# Patient Record
Sex: Male | Born: 1950 | Race: White | Hispanic: No | Marital: Married | Smoking: Former smoker
Health system: Southern US, Community
[De-identification: ages and names within clinical notes are randomized; demographics above are authoritative.]

## PROBLEM LIST (undated history)

## (undated) DIAGNOSIS — M1990 Unspecified osteoarthritis, unspecified site: Secondary | ICD-10-CM

## (undated) DIAGNOSIS — M545 Low back pain, unspecified: Secondary | ICD-10-CM

## (undated) DIAGNOSIS — K859 Acute pancreatitis without necrosis or infection, unspecified: Secondary | ICD-10-CM

## (undated) DIAGNOSIS — E785 Hyperlipidemia, unspecified: Secondary | ICD-10-CM

## (undated) DIAGNOSIS — T7840XA Allergy, unspecified, initial encounter: Secondary | ICD-10-CM

## (undated) DIAGNOSIS — E119 Type 2 diabetes mellitus without complications: Secondary | ICD-10-CM

## (undated) DIAGNOSIS — R0600 Dyspnea, unspecified: Secondary | ICD-10-CM

## (undated) DIAGNOSIS — I10 Essential (primary) hypertension: Secondary | ICD-10-CM

## (undated) DIAGNOSIS — K219 Gastro-esophageal reflux disease without esophagitis: Secondary | ICD-10-CM

## (undated) DIAGNOSIS — D649 Anemia, unspecified: Secondary | ICD-10-CM

## (undated) HISTORY — DX: Low back pain: M54.5

## (undated) HISTORY — DX: Low back pain, unspecified: M54.50

## (undated) HISTORY — PX: COLONOSCOPY: SHX174

## (undated) HISTORY — DX: Hyperlipidemia, unspecified: E78.5

## (undated) HISTORY — DX: Type 2 diabetes mellitus without complications: E11.9

## (undated) HISTORY — DX: Allergy, unspecified, initial encounter: T78.40XA

## (undated) HISTORY — DX: Anemia, unspecified: D64.9

## (undated) HISTORY — DX: Essential (primary) hypertension: I10

## (undated) HISTORY — DX: Acute pancreatitis without necrosis or infection, unspecified: K85.90

## (undated) HISTORY — DX: Unspecified osteoarthritis, unspecified site: M19.90

## (undated) HISTORY — PX: OTHER SURGICAL HISTORY: SHX169

## (undated) HISTORY — DX: Gastro-esophageal reflux disease without esophagitis: K21.9

---
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
## 1991-09-19 HISTORY — PX: LAMINECTOMY: SHX219

## 1998-08-17 ENCOUNTER — Inpatient Hospital Stay (HOSPITAL_COMMUNITY): Admission: EM | Admit: 1998-08-17 | Discharge: 1998-08-21 | Payer: Self-pay | Admitting: Emergency Medicine

## 1998-08-17 ENCOUNTER — Encounter: Payer: Self-pay | Admitting: Emergency Medicine

## 1999-12-18 ENCOUNTER — Encounter: Payer: Self-pay | Admitting: Family Medicine

## 1999-12-18 LAB — CONVERTED CEMR LAB: PSA: 1.2 ng/mL

## 2000-12-17 ENCOUNTER — Encounter: Payer: Self-pay | Admitting: Family Medicine

## 2000-12-17 LAB — CONVERTED CEMR LAB: PSA: 0.9 ng/mL

## 2004-03-18 ENCOUNTER — Encounter: Payer: Self-pay | Admitting: Family Medicine

## 2004-03-18 LAB — CONVERTED CEMR LAB: PSA: 1.4 ng/mL

## 2004-10-11 ENCOUNTER — Ambulatory Visit: Payer: Self-pay | Admitting: Family Medicine

## 2004-11-07 ENCOUNTER — Ambulatory Visit: Payer: Self-pay | Admitting: Family Medicine

## 2005-01-31 ENCOUNTER — Ambulatory Visit: Payer: Self-pay | Admitting: Family Medicine

## 2005-02-14 ENCOUNTER — Ambulatory Visit: Payer: Self-pay | Admitting: Family Medicine

## 2005-04-27 ENCOUNTER — Ambulatory Visit: Payer: Self-pay | Admitting: Family Medicine

## 2005-04-27 LAB — CONVERTED CEMR LAB
Hgb A1c MFr Bld: 5.9 %
PSA: 1.74 ng/mL

## 2005-05-01 ENCOUNTER — Ambulatory Visit: Payer: Self-pay | Admitting: Family Medicine

## 2005-05-15 ENCOUNTER — Ambulatory Visit: Payer: Self-pay | Admitting: Family Medicine

## 2007-03-01 ENCOUNTER — Ambulatory Visit: Payer: Self-pay | Admitting: Family Medicine

## 2007-03-01 DIAGNOSIS — Z87891 Personal history of nicotine dependence: Secondary | ICD-10-CM | POA: Insufficient documentation

## 2007-03-01 DIAGNOSIS — N400 Enlarged prostate without lower urinary tract symptoms: Secondary | ICD-10-CM | POA: Insufficient documentation

## 2007-03-01 LAB — CONVERTED CEMR LAB
ALT: 16 units/L (ref 0–40)
AST: 15 units/L (ref 0–37)
Albumin: 4.2 g/dL (ref 3.5–5.2)
Alkaline Phosphatase: 36 units/L — ABNORMAL LOW (ref 39–117)
BUN: 15 mg/dL (ref 6–23)
Basophils Absolute: 0.1 10*3/uL (ref 0.0–0.1)
Basophils Relative: 0.6 % (ref 0.0–1.0)
Bilirubin, Direct: 0.1 mg/dL (ref 0.0–0.3)
CO2: 30 meq/L (ref 19–32)
Calcium: 9.5 mg/dL (ref 8.4–10.5)
Chloride: 107 meq/L (ref 96–112)
Cholesterol: 144 mg/dL (ref 0–200)
Creatinine, Ser: 0.9 mg/dL (ref 0.4–1.5)
Creatinine,U: 104.2 mg/dL
Eosinophils Absolute: 0.1 10*3/uL (ref 0.0–0.6)
Eosinophils Relative: 1.3 % (ref 0.0–5.0)
GFR calc Af Amer: 114 mL/min
GFR calc non Af Amer: 94 mL/min
Glucose, Bld: 95 mg/dL (ref 70–99)
HCT: 40 % (ref 39.0–52.0)
HDL: 33.4 mg/dL — ABNORMAL LOW (ref >39.0)
Hemoglobin: 14 g/dL (ref 13.0–17.0)
LDL Cholesterol: 96 mg/dL (ref 0–99)
Lipase: 16 units/L (ref 11.0–59.0)
Lymphocytes Relative: 24.3 % (ref 12.0–46.0)
MCHC: 35.1 g/dL (ref 30.0–36.0)
MCV: 87.4 fL (ref 78.0–100.0)
Microalb Creat Ratio: 2.9 mg/g (ref 0.0–30.0)
Microalb, Ur: 0.3 mg/dL (ref 0.0–1.9)
Monocytes Absolute: 0.8 10*3/uL — ABNORMAL HIGH (ref 0.2–0.7)
Monocytes Relative: 8.3 % (ref 3.0–11.0)
Neutro Abs: 6.3 10*3/uL (ref 1.4–7.7)
Neutrophils Relative %: 65.5 % (ref 43.0–77.0)
PSA: 0.93 ng/mL (ref 0.10–4.00)
Platelets: 179 10*3/uL (ref 150–400)
Potassium: 4.4 meq/L (ref 3.5–5.1)
RBC: 4.58 M/uL (ref 4.22–5.81)
RDW: 12.5 % (ref 11.5–14.6)
Sodium: 142 meq/L (ref 135–145)
TSH: 0.88 microintl units/mL (ref 0.35–5.50)
Total Bilirubin: 1.1 mg/dL (ref 0.3–1.2)
Total CHOL/HDL Ratio: 4.3
Total Protein: 6.2 g/dL (ref 6.0–8.3)
Triglycerides: 74 mg/dL (ref 0–149)
VLDL: 15 mg/dL (ref 0–40)
WBC: 9.7 10*3/uL (ref 4.5–10.5)

## 2007-03-04 ENCOUNTER — Encounter: Payer: Self-pay | Admitting: Family Medicine

## 2007-03-04 DIAGNOSIS — K219 Gastro-esophageal reflux disease without esophagitis: Secondary | ICD-10-CM | POA: Insufficient documentation

## 2007-03-04 DIAGNOSIS — F528 Other sexual dysfunction not due to a substance or known physiological condition: Secondary | ICD-10-CM | POA: Insufficient documentation

## 2007-03-04 DIAGNOSIS — E785 Hyperlipidemia, unspecified: Secondary | ICD-10-CM | POA: Insufficient documentation

## 2007-03-04 DIAGNOSIS — E291 Testicular hypofunction: Secondary | ICD-10-CM | POA: Insufficient documentation

## 2007-03-04 DIAGNOSIS — I10 Essential (primary) hypertension: Secondary | ICD-10-CM | POA: Insufficient documentation

## 2007-03-04 DIAGNOSIS — E119 Type 2 diabetes mellitus without complications: Secondary | ICD-10-CM | POA: Insufficient documentation

## 2007-03-05 ENCOUNTER — Ambulatory Visit: Payer: Self-pay | Admitting: Family Medicine

## 2007-03-20 ENCOUNTER — Encounter (INDEPENDENT_AMBULATORY_CARE_PROVIDER_SITE_OTHER): Payer: Self-pay | Admitting: *Deleted

## 2007-03-20 ENCOUNTER — Ambulatory Visit: Payer: Self-pay | Admitting: Family Medicine

## 2007-08-16 ENCOUNTER — Ambulatory Visit: Payer: Self-pay | Admitting: Family Medicine

## 2007-08-16 DIAGNOSIS — M549 Dorsalgia, unspecified: Secondary | ICD-10-CM | POA: Insufficient documentation

## 2008-03-05 ENCOUNTER — Telehealth: Payer: Self-pay | Admitting: Family Medicine

## 2008-04-03 ENCOUNTER — Ambulatory Visit: Payer: Self-pay | Admitting: Family Medicine

## 2008-04-05 LAB — CONVERTED CEMR LAB
ALT: 20 units/L (ref 0–53)
AST: 16 units/L (ref 0–37)
Albumin: 4.5 g/dL (ref 3.5–5.2)
Alkaline Phosphatase: 47 units/L (ref 39–117)
BUN: 11 mg/dL (ref 6–23)
Basophils Absolute: 0 10*3/uL (ref 0.0–0.1)
Basophils Relative: 0.1 % (ref 0.0–3.0)
Bilirubin, Direct: 0.1 mg/dL (ref 0.0–0.3)
CO2: 30 meq/L (ref 19–32)
Calcium: 9.8 mg/dL (ref 8.4–10.5)
Chloride: 104 meq/L (ref 96–112)
Cholesterol: 174 mg/dL (ref 0–200)
Creatinine, Ser: 0.9 mg/dL (ref 0.4–1.5)
Eosinophils Absolute: 0.2 10*3/uL (ref 0.0–0.7)
Eosinophils Relative: 2.1 % (ref 0.0–5.0)
GFR calc Af Amer: 113 mL/min
GFR calc non Af Amer: 93 mL/min
Glucose, Bld: 99 mg/dL (ref 70–99)
HCT: 42.6 % (ref 39.0–52.0)
HDL: 32.2 mg/dL — ABNORMAL LOW (ref >39.0)
Hemoglobin: 14.5 g/dL (ref 13.0–17.0)
LDL Cholesterol: 112 mg/dL — ABNORMAL HIGH (ref 0–99)
Lymphocytes Relative: 32.9 % (ref 12.0–46.0)
MCHC: 34 g/dL (ref 30.0–36.0)
MCV: 90.1 fL (ref 78.0–100.0)
Monocytes Absolute: 0.9 10*3/uL (ref 0.1–1.0)
Monocytes Relative: 8.8 % (ref 3.0–12.0)
Neutro Abs: 5.7 10*3/uL (ref 1.4–7.7)
Neutrophils Relative %: 56.1 % (ref 43.0–77.0)
PSA: 0.97 ng/mL (ref 0.10–4.00)
Platelets: 186 10*3/uL (ref 150–400)
Potassium: 4.4 meq/L (ref 3.5–5.1)
RBC: 4.73 M/uL (ref 4.22–5.81)
RDW: 12.5 % (ref 11.5–14.6)
Sodium: 141 meq/L (ref 135–145)
TSH: 1.41 microintl units/mL (ref 0.35–5.50)
Testosterone: 477.07 ng/dL (ref 350.00–890)
Total Bilirubin: 1 mg/dL (ref 0.3–1.2)
Total CHOL/HDL Ratio: 5.4
Total Protein: 6.7 g/dL (ref 6.0–8.3)
Triglycerides: 150 mg/dL — ABNORMAL HIGH (ref 0–149)
VLDL: 30 mg/dL (ref 0–40)
Vitamin B-12: 783 pg/mL (ref 211–911)
WBC: 10.1 10*3/uL (ref 4.5–10.5)

## 2008-04-07 ENCOUNTER — Ambulatory Visit: Payer: Self-pay | Admitting: Family Medicine

## 2008-04-17 ENCOUNTER — Ambulatory Visit: Payer: Self-pay | Admitting: Family Medicine

## 2008-04-17 LAB — CONVERTED CEMR LAB
OCCULT 1: NEGATIVE
OCCULT 2: NEGATIVE
OCCULT 3: NEGATIVE

## 2008-04-20 ENCOUNTER — Encounter (INDEPENDENT_AMBULATORY_CARE_PROVIDER_SITE_OTHER): Payer: Self-pay | Admitting: *Deleted

## 2009-04-09 ENCOUNTER — Ambulatory Visit: Payer: Self-pay | Admitting: Family Medicine

## 2009-04-09 LAB — CONVERTED CEMR LAB
ALT: 17 units/L (ref 0–53)
AST: 18 units/L (ref 0–37)
Albumin: 4.3 g/dL (ref 3.5–5.2)
Alkaline Phosphatase: 39 units/L (ref 39–117)
BUN: 12 mg/dL (ref 6–23)
Basophils Absolute: 0.1 10*3/uL (ref 0.0–0.1)
Basophils Relative: 0.7 % (ref 0.0–3.0)
Bilirubin, Direct: 0.1 mg/dL (ref 0.0–0.3)
CO2: 31 meq/L (ref 19–32)
Calcium: 9.7 mg/dL (ref 8.4–10.5)
Chloride: 106 meq/L (ref 96–112)
Cholesterol: 192 mg/dL (ref 0–200)
Creatinine, Ser: 0.9 mg/dL (ref 0.4–1.5)
Creatinine,U: 143.2 mg/dL
Direct LDL: 94.3 mg/dL
Eosinophils Absolute: 0.2 10*3/uL (ref 0.0–0.7)
Eosinophils Relative: 1.5 % (ref 0.0–5.0)
GFR calc non Af Amer: 92.82 mL/min (ref >60)
Glucose, Bld: 99 mg/dL (ref 70–99)
HCT: 41.7 % (ref 39.0–52.0)
HDL: 31.8 mg/dL — ABNORMAL LOW (ref >39.00)
Hemoglobin: 14.5 g/dL (ref 13.0–17.0)
Lipase: 9 units/L — ABNORMAL LOW (ref 11.0–59.0)
Lymphocytes Relative: 26.4 % (ref 12.0–46.0)
Lymphs Abs: 2.8 10*3/uL (ref 0.7–4.0)
MCHC: 34.8 g/dL (ref 30.0–36.0)
MCV: 87.4 fL (ref 78.0–100.0)
Microalb Creat Ratio: 1.4 mg/g (ref 0.0–30.0)
Microalb, Ur: 0.2 mg/dL (ref 0.0–1.9)
Monocytes Absolute: 0.9 10*3/uL (ref 0.1–1.0)
Monocytes Relative: 8.3 % (ref 3.0–12.0)
Neutro Abs: 6.5 10*3/uL (ref 1.4–7.7)
Neutrophils Relative %: 63.1 % (ref 43.0–77.0)
PSA: 3.06 ng/mL (ref 0.10–4.00)
Platelets: 193 10*3/uL (ref 150.0–400.0)
Potassium: 4.7 meq/L (ref 3.5–5.1)
RBC: 4.77 M/uL (ref 4.22–5.81)
RDW: 12.4 % (ref 11.5–14.6)
Sed Rate: 5 mm/hr (ref 0–22)
Sodium: 141 meq/L (ref 135–145)
Total Bilirubin: 0.8 mg/dL (ref 0.3–1.2)
Total CHOL/HDL Ratio: 6
Total Protein: 6.7 g/dL (ref 6.0–8.3)
Triglycerides: 366 mg/dL — ABNORMAL HIGH (ref 0.0–149.0)
VLDL: 73.2 mg/dL — ABNORMAL HIGH (ref 0.0–40.0)
WBC: 10.5 10*3/uL (ref 4.5–10.5)

## 2009-04-12 LAB — CONVERTED CEMR LAB: Vit D, 25-Hydroxy: 16 ng/mL — ABNORMAL LOW (ref 30–89)

## 2009-04-14 ENCOUNTER — Ambulatory Visit: Payer: Self-pay | Admitting: Family Medicine

## 2009-04-14 DIAGNOSIS — E559 Vitamin D deficiency, unspecified: Secondary | ICD-10-CM | POA: Insufficient documentation

## 2009-04-23 ENCOUNTER — Ambulatory Visit: Payer: Self-pay | Admitting: Family Medicine

## 2009-04-23 LAB — CONVERTED CEMR LAB
OCCULT 1: NEGATIVE
OCCULT 2: NEGATIVE
OCCULT 3: NEGATIVE

## 2009-04-23 LAB — FECAL OCCULT BLOOD, GUAIAC: Fecal Occult Blood: NEGATIVE

## 2009-04-26 ENCOUNTER — Encounter (INDEPENDENT_AMBULATORY_CARE_PROVIDER_SITE_OTHER): Payer: Self-pay | Admitting: *Deleted

## 2009-07-12 ENCOUNTER — Ambulatory Visit: Payer: Self-pay | Admitting: Family Medicine

## 2009-07-12 DIAGNOSIS — R972 Elevated prostate specific antigen [PSA]: Secondary | ICD-10-CM | POA: Insufficient documentation

## 2009-07-12 LAB — CONVERTED CEMR LAB
PSA: 1.26 ng/mL (ref 0.10–4.00)
Vitamin B-12: 252 pg/mL (ref 211–911)

## 2009-07-13 LAB — CONVERTED CEMR LAB: Vit D, 25-Hydroxy: 36 ng/mL (ref 30–89)

## 2009-07-15 ENCOUNTER — Ambulatory Visit: Payer: Self-pay | Admitting: Family Medicine

## 2009-07-15 DIAGNOSIS — D518 Other vitamin B12 deficiency anemias: Secondary | ICD-10-CM | POA: Insufficient documentation

## 2009-10-11 ENCOUNTER — Telehealth: Payer: Self-pay | Admitting: Family Medicine

## 2009-11-08 ENCOUNTER — Ambulatory Visit: Payer: Self-pay | Admitting: Family Medicine

## 2009-11-09 LAB — CONVERTED CEMR LAB: Vit D, 25-Hydroxy: 39 ng/mL (ref 30–89)

## 2009-11-11 ENCOUNTER — Ambulatory Visit: Payer: Self-pay | Admitting: Family Medicine

## 2009-12-30 ENCOUNTER — Ambulatory Visit: Payer: Self-pay | Admitting: Family Medicine

## 2010-04-20 ENCOUNTER — Encounter (INDEPENDENT_AMBULATORY_CARE_PROVIDER_SITE_OTHER): Payer: Self-pay | Admitting: *Deleted

## 2010-06-29 ENCOUNTER — Ambulatory Visit: Payer: Self-pay | Admitting: Family Medicine

## 2010-08-24 ENCOUNTER — Ambulatory Visit: Payer: Self-pay | Admitting: Family Medicine

## 2010-09-09 ENCOUNTER — Ambulatory Visit: Payer: Self-pay | Admitting: Family Medicine

## 2010-09-09 ENCOUNTER — Encounter: Payer: Self-pay | Admitting: Family Medicine

## 2010-09-09 LAB — CONVERTED CEMR LAB
ALT: 22 units/L (ref 0–53)
AST: 15 units/L (ref 0–37)
Albumin: 4.3 g/dL (ref 3.5–5.2)
Alkaline Phosphatase: 38 units/L — ABNORMAL LOW (ref 39–117)
BUN: 19 mg/dL (ref 6–23)
Basophils Absolute: 0 10*3/uL (ref 0.0–0.1)
Basophils Relative: 0.5 % (ref 0.0–3.0)
Bilirubin, Direct: 0.1 mg/dL (ref 0.0–0.3)
CO2: 31 meq/L (ref 19–32)
Calcium: 9.7 mg/dL (ref 8.4–10.5)
Chloride: 101 meq/L (ref 96–112)
Cholesterol: 215 mg/dL — ABNORMAL HIGH (ref 0–200)
Creatinine, Ser: 1 mg/dL (ref 0.4–1.5)
Creatinine,U: 304 mg/dL
Direct LDL: 135.3 mg/dL
Eosinophils Absolute: 0.1 10*3/uL (ref 0.0–0.7)
Eosinophils Relative: 1.6 % (ref 0.0–5.0)
GFR calc non Af Amer: 86.77 mL/min (ref >60.00)
Glucose, Bld: 99 mg/dL (ref 70–99)
HCT: 39.4 % (ref 39.0–52.0)
HDL: 38.3 mg/dL — ABNORMAL LOW (ref >39.00)
Hemoglobin: 13.5 g/dL (ref 13.0–17.0)
Iron: 75 ug/dL (ref 42–165)
Lymphocytes Relative: 36.9 % (ref 12.0–46.0)
Lymphs Abs: 3.3 10*3/uL (ref 0.7–4.0)
MCHC: 34.3 g/dL (ref 30.0–36.0)
MCV: 87.6 fL (ref 78.0–100.0)
Microalb Creat Ratio: 0.8 mg/g (ref 0.0–30.0)
Microalb, Ur: 2.5 mg/dL — ABNORMAL HIGH (ref 0.0–1.9)
Monocytes Absolute: 0.9 10*3/uL (ref 0.1–1.0)
Monocytes Relative: 9.8 % (ref 3.0–12.0)
Neutro Abs: 4.6 10*3/uL (ref 1.4–7.7)
Neutrophils Relative %: 51.2 % (ref 43.0–77.0)
PSA: 0.93 ng/mL (ref 0.10–4.00)
Platelets: 161 10*3/uL (ref 150.0–400.0)
Potassium: 5.2 meq/L — ABNORMAL HIGH (ref 3.5–5.1)
RBC: 4.49 M/uL (ref 4.22–5.81)
RDW: 13.3 % (ref 11.5–14.6)
Sodium: 139 meq/L (ref 135–145)
TSH: 0.92 microintl units/mL (ref 0.35–5.50)
Total Bilirubin: 0.4 mg/dL (ref 0.3–1.2)
Total CHOL/HDL Ratio: 6
Total Protein: 6.4 g/dL (ref 6.0–8.3)
Triglycerides: 266 mg/dL — ABNORMAL HIGH (ref 0.0–149.0)
VLDL: 53.2 mg/dL — ABNORMAL HIGH (ref 0.0–40.0)
Vitamin B-12: >1500 pg/mL — ABNORMAL HIGH (ref 211–911)
WBC: 9 10*3/uL (ref 4.5–10.5)

## 2010-09-11 LAB — CONVERTED CEMR LAB: Vit D, 25-Hydroxy: 45 ng/mL (ref 30–89)

## 2010-09-14 ENCOUNTER — Ambulatory Visit: Payer: Self-pay | Admitting: Family Medicine

## 2010-10-20 NOTE — Progress Notes (Signed)
Summary: Rx Cialis  Phone Note Refill Request Message from:  Fax from Pharmacy on October 11, 2009 9:06 AM  Refills Requested: Medication #1:  CIALIS 20 MG  TABS 1 hour prior as needed   Supply Requested: 1 month   Last Ref[facility]: [facility]/09/[facility] [address] [phone]   Method Requested: Electronic Initial call taken by: Benny Lennert CMA (AAMA),  October 11, 2009 9:07 AM    Prescriptions: CIALIS 20 MG  TABS (TADALAFIL) 1 hour prior as needed  #10 x 6   Entered and Authorized by:   Shaune Leeks MD   Signed by:   Shaune Leeks MD on 10/11/2009   Method used:   Electronically to        [id number] (retail[facility]       [address]       [place], [place]  [address]       Ph: [phone] or [phone]       Fax: [phone]   RxID:   [id number]

## 2010-10-20 NOTE — Assessment & Plan Note (Signed)
Summary: CPX/RBH   Vital Signs:  Patient profile:   60 year old male Height:      70.5 inches Weight:      183 pounds BMI:     25.98 Temp:     98.5 degrees F oral Pulse rate:   76 / minute Pulse rhythm:   regular BP sitting:   116 / 66  (left arm) Cuff size:   large  Vitals Entered By: Delilah Shan CMA Duncan Dull) (September 14, 2010 8:19 AM) CC: CPX   History of Present Illness: Pt here for Comp Exam. He feels well except he has back pain for which he was seen by Dr Laural Benes earlier this month and given steroids which has made some improvement. He hasn't been working much lately due to economy.  He has no other complaints.  Preventive Screening-Counseling & Management  Alcohol-Tobacco     Alcohol drinks/day: 0     Smoking Status: quit > 6 months     Smoking Cessation Counseling: yes     Packs/Day: <0.25     Year Started: 1971     Year Quit: 2010     Pack years: 100     Cans of tobacco/week: no     Passive Smoke Exposure: no  Caffeine-Diet-Exercise     Caffeine use/day: 2     Does Patient Exercise: no  Problems Prior to Update: 1)  Sciatica  (ICD-724.3) 2)  Back Pain, Lumbar  (ICD-724.2) 3)  Sebaceous Cyst, Scalp and Post To Left Ear  (ICD-706.2) 4)  Anemia, Vitamin B12 Deficiency  (ICD-281.1) 5)  Elevated Prostate Specific Antigen  (ICD-790.93) 6)  Unspecified Vitamin D Deficiency  (ICD-268.9) 7)  Back Pain  (ICD-724.5) 8)  Screening For Malignannt Neoplasm, Site Nec  (ICD-V76.49) 9)  Health Maintenance Exam  (ICD-V70.0) 10)  Alcohol Abuse, Hx Of, Quit 12/99  (ICD-V11.3) 11)  Hyperglycemia  (ICD-790.6) 12)  Testosterone Deficiency  (ICD-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 Take 1 Tablet By Mouth Once A Day 2)  Vitamin C 1000 Mg  Tabs (Ascorbic Acid) .... Take One By Mouth Daily 3)  Vitamin E-400   Caps (Vitamin E Caps) .... Take One By Mouth Daily 4)  Aspirin Ec 325 Mg  Tbec (Aspirin) .... Take One By Mouth Daily 5)  Zinc   Tabs (Zinc Tabs) .... Take One By Mouth Daily 6)  Fish Oil   Caps (Omega-3 Fatty Acids Caps) .... Take One By Mouth Daily 7)  Potassium .... Take One By Mouth Daily 8)  Cialis 20 Mg  Tabs (Tadalafil) .Marland Kitchen.. 1 Hour Prior As Needed 9)  Vitamin D 1000 Unit Tabs (Cholecalciferol) .... Take One By Mouth Daily 10)  Lisinopril-Hydrochlorothiazide 10-12.5 Mg Tabs (Lisinopril-Hydrochlorothiazide) .... Take 1 Tab By Mouth Daily For Blood Pressure  Current Medications (verified): 1)  Lipitor 20 Mg Tabs (Atorvastatin Calcium) .... Take 1 Tablet By Mouth Once A Day 2)  Vitamin C 1000 Mg  Tabs (Ascorbic Acid) .... Take One By Mouth Daily 3)  Vitamin E-400   Caps (Vitamin E Caps) .... Take One By Mouth Daily 4)  Aspirin Ec 325 Mg  Tbec (Aspirin) .Marland KitchenMarland KitchenMarland Kitchen  Take One By Mouth Daily 5)  Zinc   Tabs (Zinc Tabs) .... Take One By Mouth Daily 6)  Fish Oil   Caps (Omega-3 Fatty Acids Caps) .... Take One By Mouth Daily 7)  Potassium .... Take One By Mouth Daily 8)  Cialis 20 Mg  Tabs (Tadalafil) .Marland Kitchen.. 1 Hour Prior As Needed 9)  Vitamin D 1000 Unit Tabs (Cholecalciferol) .... Take One By Mouth Daily 10)  Lisinopril-Hydrochlorothiazide 10-12.5 Mg Tabs (Lisinopril-Hydrochlorothiazide) .... Take 1 Tab By Mouth Daily For Blood Pressure  Allergies: No Known Drug Allergies  Past History:  Past Medical History: Last updated: 08/24/2010 GERD Pancreatitis, hx of HTN LBP  Past Surgical History: Last updated: 04/14/2009 SEPTOPLASTY  17YOA LAMINECTOMY  L5/S1  1993  HOSP PANCREATITIS  ETOH  08/1998  Family History: Last updated: 09/14/2010 Father: dec 62 with alcohol  disease Mother A 81  hypertension, diabetes, elevated cholesterol and hypothyroidism Sister A 88 DM  Social History: Last updated: 08/24/2010 Marital Status: Married Children: One daughter Occupation: the [employer]  Risk Factors: Alcohol Use: 0 (09/14/2010) Caffeine Use: 2 (09/14/2010) Exercise: no (09/14/2010)  Risk Factors: Smoking Status: quit > 6 months (09/14/2010) Packs/Day: <0.25 (09/14/2010) Cans of tobacco/wk: no (09/14/2010) Passive Smoke Exposure: no (09/14/2010)  Family History: Father: dec 62 with alcohol disease Mother A 81  hypertension, diabetes, elevated cholesterol and hypothyroidism Sister A 58 DM  Social History: Smoking Status:  quit > 6 months Packs/Day:  <0.25 Caffeine use/day:  2  Review of Systems General:  Denies chills, fatigue, fever, sweats, weakness, and weight loss. Eyes:  Denies blurring, discharge, and eye pain. ENT:  Complains of ringing in ears; denies decreased hearing, ear discharge, and earache; after taking his medications.. CV:  Complains of shortness of breath with exertion; denies chest pain or discomfort, fainting, fatigue, palpitations, swelling of feet, and swelling of hands; occas. Resp:  Denies chest pain with inspiration, cough, shortness of breath, and wheezing. GI:  Complains of constipation; denies abdominal pain, bloody stools, change in bowel habits, dark tarry stools, diarrhea, excessive appetite, gas, hemorrhoids, indigestion, loss of appetite, nausea, vomiting, vomiting blood, and yellowish skin color; occas. GU:  Complains of nocturia; denies discharge, dysuria, and urinary frequency; occas. MS:  Complains of low back pain; denies joint pain, muscle weakness, and stiffness; slightly improving. Derm:  Denies dryness, itching, and rash. Neuro:  Complains of tingling; denies numbness, poor balance, and tremors; rare with posture in chair.  Physical Exam  General:  Well-developed,well-nourished,in no acute distress;  alert,appropriate and cooperative throughout examination, looks good. Head:  Normocephalic and atraumatic without obvious abnormalities. No apparent alopecia or balding. Sinuses NT Eyes:  Conjunctiva clear bilaterally.  Ears:  External ear exam shows no significant lesions or deformities.  Otoscopic examination reveals clear canals, tympanic membranes are intact bilaterally without bulging, retraction, inflammation or discharge. Hearing is grossly normal bilaterally. Nose:  External nasal examination shows no deformity or inflammation. Nasal mucosa are pink and moist without lesions or exudates. Mouth:  Oral mucosa and oropharynx without lesions or exudates.  Teeth in good repair. Neck:  No deformities, masses, or tenderness noted. Chest Wall:  No deformities, masses, tenderness or gynecomastia noted. Breasts:  No masses or gynecomastia noted Lungs:  Normal respiratory effort, chest expands symmetrically. Lungs are clear to auscultation, no crackles or wheezes. Heart:  Normal rate and regular rhythm. S1 and S2 normal without gallop, murmur, click, rub or other extra sounds. Abdomen:  Bowel sounds positive,abdomen soft and non-tender without masses, organomegaly or  hernias noted. Bulge from last visit on right side resolved. Rectal:  No external abnormalities noted. Normal sphincter tone. No rectal masses or tenderness. G neg. Genitalia:  Testes bilaterally descended without nodularity, tenderness or masses. No scrotal masses or lesions. No penis lesions or urethral discharge. Prostate:  Prostate gland firm and smooth, no enlargement, nodularity, tenderness, mass, asymmetry or induration. 10gms. Msk:  No deformity or scoliosis noted of thoracic or lumbar spine.   Pulses:  R and L carotid,radial,femoral,dorsalis pedis and posterior tibial pulses are full and equal bilaterally Extremities:  No clubbing, cyanosis, edema, or deformity noted with normal full range of motion of all joints.   Neurologic:   No cranial nerve deficits noted. Station and gait are normal.  Sensory, motor and coordinative functions appear intact. Skin:  Intact without suspicious lesions or rashes Cervical Nodes:  No lymphadenopathy noted...specifically no left post cerv adenopathy that sounds like what he had a few days ago. Inguinal Nodes:  No significant adenopathy Psych:  Cognition and judgment appear intact. Alert and cooperative with normal attention span and concentration. No apparent delusions, illusions, hallucinations   Impression & Recommendations:  Problem # 1:  HEALTH MAINTENANCE EXAM (ICD-V70.0) Assessment Comment Only  Reviewed preventive care protocols, scheduled due services, and updated immunizations.  Problem # 2:  BACK PAIN, LUMBAR (ICD-724.2) Assessment: Improved Slightly better. Cont med regiimen of Dr Laural Benes. His updated medication list for this problem includes:    Aspirin Ec 325 Mg Tbec (Aspirin) .Marland Kitchen... Take one by mouth daily  Problem # 3:  ANEMIA, VITAMIN B12 DEFICIENCY (ICD-281.1) Assessment: Improved NML. Cont meds.  Problem # 4:  ELEVATED PROSTATE SPECIFIC ANTIGEN (ICD-790.93) Assessment: Improved Nml today.  Problem # 5:  UNSPECIFIED VITAMIN D DEFICIENCY (ICD-268.9) Assessment: Improved Cont curr tx.  Problem # 6:  ALCOHOL ABUSE, HX OF, QUIT 12/99 (ICD-V11.3) Assessment: Improved Continuing to avoid and knowes he is an alcoholic.  Problem # 7:  HYPERGLYCEMIA (ICD-790.6) Assessment: Improved Euglycemic today. Cont.  Problem # 8:  DISORDER, TOBACCO USE (ICD-305.1) Still using "smoking  sticks" but still off cigarettes. Encouraged to taper.  Problem # 9:  HYPERLIPIDEMIA NEC/NOS (ICD-272.4) Assessment: Unchanged Adequate. Discussed diet to improve current nos. His updated medication list for this problem includes:    Lipitor 20 Mg Tabs (Atorvastatin calcium) .Marland Kitchen... Take 1 tablet by mouth once a day  Labs Reviewed: SGOT: 15 (09/09/2010)   SGPT: 22 (09/09/2010)    HDL:38.30 (09/09/2010), 31.80 (04/09/2009)  LDL:112 (04/03/2008), 96 (30/86/5784)  Chol:215 (09/09/2010), 192 (04/09/2009)  Trig:266.0 (09/09/2010), 366.0 (04/09/2009)  Problem # 10:  HYPERTENSION, BENIGN ESSENTIAL (ICD-401.1) Assessment: Improved  Better today than at back doctor.  Conyt curr meds. His updated medication list for this problem includes:    Lisinopril-hydrochlorothiazide 10-12.5 Mg Tabs (Lisinopril-hydrochlorothiazide) .Marland Kitchen... Take 1 tab by mouth daily for blood pressure  BP today: 116/66 Prior BP: 154/90 (08/24/2010)  Labs Reviewed: K+: 5.2 (09/09/2010) Creat: : 1.0 (09/09/2010)   Chol: 215 (09/09/2010)   HDL: 38.30 (09/09/2010)   LDL: 112 (04/03/2008)   TG: 266.0 (09/09/2010)  Complete Medication List: 1)  Lipitor 20 Mg Tabs (Atorvastatin calcium) .... Take 1 tablet by mouth once a day 2)  Vitamin C 1000 Mg Tabs (Ascorbic acid) .... Take one by mouth daily 3)  Vitamin E-400 Caps (Vitamin e caps) .... Take one by mouth daily 4)  Aspirin Ec 325 Mg Tbec (Aspirin) .... Take one by mouth daily 5)  Zinc Tabs (Zinc tabs) .... Take one by mouth daily 6)  Fish Oil Caps (Omega-3 fatty acids caps) .... Take one by mouth daily 7)  Potassium  .... Take one by mouth daily 8)  Cialis 20 Mg Tabs (Tadalafil) .Marland Kitchen.. 1 hour prior as needed 9)  Vitamin D 1000 Unit Tabs (Cholecalciferol) .... Take one by mouth daily 10)  Lisinopril-hydrochlorothiazide 10-12.5 Mg Tabs (Lisinopril-hydrochlorothiazide) .... Take 1 tab by mouth daily for blood pressure  Patient Instructions: 1)  RTC 6 mos. CXR next time if needed. 2)  Take Aleve 2 after brfst and 2 after supper.   Orders Added: 1)  Est. Patient 40-64 years [99396]    Current Allergies (reviewed today): No known allergies

## 2010-10-20 NOTE — Assessment & Plan Note (Signed)
Summary: 4TH F/U AFTER LABS / LFW   Vital Signs:  Patient profile:   60 year old male Weight:      175.75 pounds Temp:     98.6 degrees F oral Pulse rate:   72 / minute Pulse rhythm:   regular BP sitting:   142 / 86  (left arm) Cuff size:   regular  Vitals Entered By: Sydell Axon LPN (November 11, 2009 8:42 AM) CC: Follow-up after lab work, wants flu shot today, given   History of Present Illness: Pt and wife quit smoking 5 weeks ago...he is using the smoke stick. It has a liitle nicotine in it. He continues to cut down regularly. He continues to take Vit D. 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 Take 1 Tablet By Mouth Once A Day 2)  Vitamin C 1000 Mg  Tabs (Ascorbic Acid) .... Take One By Mouth Daily 3)  Vitamin E-400   Caps (Vitamin E Caps) .... Take One By Mouth Daily 4)  Aspirin Ec 325 Mg  Tbec (Aspirin) .... Take One By Mouth Daily 5)  Zinc   Tabs (Zinc Tabs) 6)  Fish Oil   Caps (Omega-3 Fatty Acids Caps) 7)  Potassium 8)  Cialis 20 Mg  Tabs (Tadalafil) .Marland Kitchen.. 1 Hour Prior As Needed 9)  Vitamin D (Ergocalciferol) 50000 Unit Caps (Ergocalciferol) .... One Tab  By Mouth Every Week.  Allergies: No Known Drug Allergies  Physical Exam  General:  Well-developed,well-nourished,in no acute distress; alert,appropriate and cooperative throughout examination, looks good. Head:  Normocephalic and atraumatic without obvious abnormalities. No apparent alopecia or balding. Sinuses NT Eyes:  Conjunctiva clear bilaterally.  Ears:  External ear exam shows no significant lesions or deformities.  Otoscopic examination reveals clear canals, tympanic membranes are intact bilaterally without bulging, retraction, inflammation or discharge. Hearing is grossly normal bilaterally. Nose:  External nasal examination shows no deformity or inflammation. Nasal mucosa are pink and moist without lesions or exudates. Mouth:  Oral mucosa and oropharynx without lesions or exudates.  Teeth in good repair. Neck:  No deformities, masses, or tenderness noted. Chest Wall:  No deformities, masses, tenderness or gynecomastia noted. Lungs:  Normal respiratory effort, chest expands symmetrically. Lungs are clear to auscultation, no crackles or wheezes. Heart:  Normal rate and regular rhythm. S1 and S2 normal without gallop, murmur, click, rub or other extra sounds.   Impression & Recommendations:  Problem # 1:  UNSPECIFIED VITAMIN D DEFICIENCY (ICD-268.9) Assessment Improved Therapeutic and has stayed there on OTC Vit D 1000 a day. Continue.  Problem # 2:  DISORDER, TOBACCO USE (ICD-305.1) Assessment: Improved Congrats on quitting Cont "Stick" but cont to cut down.   Complete Medication List: 1)  Lipitor 20 Mg Tabs (Atorvastatin calcium) .... Take 1 tablet by mouth once a day 2)  Vitamin C 1000 Mg Tabs (Ascorbic acid) .... Take one by mouth daily 3)  Vitamin E-400 Caps (Vitamin e caps) .... Take one by mouth daily 4)  Aspirin Ec 325 Mg Tbec (Aspirin) .... Take one by mouth daily 5)  Zinc Tabs (Zinc tabs) 6)  Fish Oil Caps (Omega-3 fatty acids caps) 7)  Potassium  8)  Cialis 20  Mg Tabs (Tadalafil) .Marland Kitchen.. 1 hour prior as needed 9)  Vitamin D 1000 Unit Tabs (Cholecalciferol) .... Take one by mouth daily  Other Orders: Admin 1st Vaccine (09811) Flu Vaccine 61yrs + (91478)  Patient Instructions: 1)  Call in Jun for appt for PE.  Current Allergies (reviewed today): No known allergies  Flu Vaccine Consent Questions     Do you have a history of severe allergic reactions to this vaccine? no    Any prior history of allergic reactions to egg and/or gelatin? no    Do you have a sensitivity to the preservative Thimersol? no    Do you have a past history of Guillan-Barre Syndrome? no    Do you currently have an acute febrile illness? no    Have you ever had a severe reaction to latex? no    Vaccine information given and explained to patient? yes    Are you currently pregnant? no    Lot Number:[id number]   Exp Date:03/17/2010   Site Given  Left Deltoid IMflu

## 2010-10-20 NOTE — Assessment & Plan Note (Signed)
Summary: CHECK KNOT BEHIND EAR,NECK/CLE   Vital Signs:  Patient profile:   60 year old male Weight:      176.75 pounds Temp:     98.3 degrees F oral Pulse rate:   88 / minute Pulse rhythm:   regular BP sitting:   142 / 90  (left arm) Cuff size:   regular  Vitals Entered By: Sydell Axon LPN (December 30, 2009 3:45 PM) CC: Check knot behind left ear and left side of head has been sore X 4-5 days   History of Present Illness: Pt here for swelling behiind the ear an up into the scalp for the last week or more which has improved over the last 24 hrs and now minimally swollen on the scalp, slightly behind the ear and neck almost back  to normal. He never had fever or chills, body aches or nausea.   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 Take 1 Tablet By Mouth Once A Day 2)  Vitamin C 1000 Mg  Tabs (Ascorbic Acid) .... Take One By Mouth Daily 3)  Vitamin E-400   Caps (Vitamin E Caps) .... Take One By Mouth Daily 4)  Aspirin Ec 325 Mg  Tbec (Aspirin) .... Take One By Mouth Daily 5)  Zinc   Tabs (Zinc Tabs) 6)  Fish Oil   Caps (Omega-3 Fatty Acids Caps) 7)  Potassium 8)  Cialis 20 Mg  Tabs  (Tadalafil) .Marland Kitchen.. 1 Hour Prior As Needed 9)  Vitamin D 1000 Unit Tabs (Cholecalciferol) .... Take One By Mouth Daily  Allergies: No Known Drug Allergies  Physical Exam  General:  Well-developed,well-nourished,in no acute distress; alert,appropriate and cooperative throughout examination, looks good, nontoxic. Head:  Normocephalic and atraumatic without obvious abnormalities. No apparent alopecia or balding. Sinuses NT Eyes:  Conjunctiva clear bilaterally.  Ears:  External ear exam shows no significant lesions or deformities.  Otoscopic examination reveals clear canals, tympanic membranes are intact bilaterally without bulging, retraction, inflammation or discharge. Hearing is grossly normal bilaterally. Nose:  External nasal examination shows no deformity or inflammation. Nasal mucosa are pink and moist without lesions or exudates. Mouth:  Oral mucosa and oropharynx without lesions or exudates.  Teeth in good repair. Neck:  No deformities, masses, or tenderness noted. Lungs:  Normal respiratory effort, chest expands symmetrically. Lungs are clear to auscultation, no crackles or wheezes. Heart:  Normal rate and regular rhythm. S1 and S2 normal without gallop, murmur, click, rub or other extra sounds. Skin:  Scalp with three lesions barely palpable that are soft and nonfluctulant. Lesion approx 1 cm behind the left ear in the crease that is soft and minimally erythem that the pt says is significantly better than even yeaterday.  Cervical Nodes:  No lymphadenopathy noted...specifically no left post cerv adenopathy that sounds like what he had a few days ago.   Impression & Recommendations:  Problem # 1:  SEBACEOUS CYST, SCALP AND POST TO LEFT EAR (ICD-706.2) Assessment New Rwesolving. Nothing to do except apply heat as often as able and RTC if worsens.  Complete Medication List: 1)  Lipitor 20 Mg Tabs (Atorvastatin calcium) .... Take 1 tablet by mouth once a day 2)  Vitamin C 1000 Mg Tabs  (Ascorbic acid) .... Take one by mouth daily 3)  Vitamin E-400 Caps (Vitamin e caps) .... Take one by mouth daily 4)  Aspirin Ec 325 Mg Tbec (Aspirin) .... Take one by mouth daily 5)  Zinc Tabs (Zinc tabs) 6)  Fish Oil Caps (Omega-3 fatty acids caps) 7)  Potassium  8)  Cialis 20 Mg Tabs (Tadalafil) .Marland Kitchen.. 1 hour prior as needed 9)  Vitamin D 1000 Unit Tabs (Cholecalciferol) .... Take one by mouth daily  Patient Instructions: 1)  RTC if sxs worsen.  Current Allergies (reviewed today): No known allergies

## 2010-10-20 NOTE — Medication Information (Signed)
Summary: Rx faxed  Rx faxed   Imported By: Dorna Leitz 08/24/2010 16:11:53  _____________________________________________________________________  External Attachment:    Type:   Image     Comment:   External Document

## 2010-10-20 NOTE — Letter (Signed)
Summary: Nadara Eaton letter  [place] [facility]  [address] [place], [place] [address]   Phone: [phone]  Fax: [phone]       04/20/2010 MRN: [id number]  [facility] [address] [place], [place]  [address]  Dear Mr. WIRT,  [facility], and [facility] announce the retirement of Arta Silence, M.D., from full-time practice at the [facility] office effective March 17, 2010 and his plans of returning part-time.  It is important to Dr. Hetty Ely and to our practice that you understand that [facility] has seven physicians in our office for your health care needs.  We will continue to offer the same exceptional care that you have today.    Dr. Hetty Ely has spoken to many of you about his plans for retirement and returning part-time in the fall.   We will continue to work with you through the transition to schedule appointments for you in the office and meet the high standards that [place] is committed to.   Again, it is with great pleasure that we share the news that Dr. Hetty Ely will return to [facility] in October of 2011 with a reduced schedule.    If you have any questions, or would like to request an appointment with one of our physicians, please call us at [phone] and press the option for Scheduling an appointment.  We take pleasure in providing you with excellent patient care and look forward to seeing you at your next office visit.  Our [facility] Physicians are:  Tillman Abide, M.D. Laurita Quint, M.D. Roxy Manns, M.D. Kerby Nora, M.D. Hannah Beat, M.D. Ruthe Mannan, M.D. We proudly welcomed Raechel Ache, M.D. and Eustaquio Boyden, M.D. to the practice in July/August 2011.  Sincerely,  [facility]

## 2010-10-20 NOTE — Assessment & Plan Note (Signed)
Summary: PAIN IN ENTIRE BACK GOING DOWN LEGS/EVM   Vital Signs:  Patient Profile:   60 Years Old Male CC:      Lower back pain. Height:     70.5 inches Weight:      179 pounds BMI:     25.41 O2 Sat:      98 % O2 treatment:    Room Air Temp:     97.6 degrees F oral Pulse rate:   71 / minute Pulse rhythm:   regular Resp:     18 per minute BP sitting:   154 / 90  (right arm)  Pt. in pain?   yes    Location:   back    Intensity:   4    Type:       aching  Vitals Entered By: Levonne Spiller EMT-P (August 24, 2010 12:47 PM)              Is Patient Diabetic? No  Does patient need assistance? Functional Status Self care Ambulation Normal      Current Allergies: No known allergies History of Present Illness History from: patient Chief Complaint: Lower back pain. History of Present Illness: The patient presents today because for the last month he has been experiencing pain in the lumbar spine and right buttocks area that shoots down the right leg.   He said that he first noticed symptoms a month back when he was at work and he lifted a heavy object and then started having symptoms later that evening.  No loss of bowel or bladder control. Pt says that he has a history of having back surgery for severe pain in the the lumbar spine and left sided sciatica in 1993.  He has done well since the surgery and says that he was told to stop doing anymore heavy lifting but says that his job could be threatened if he refused to help the guys with the heavy lifting.  He says that the pain is 8/10 at its worst and that it is usually more painful at the end of a long day at work.  He says that sitting for long periods of time also cause pain.  He denies CP, SOB, headache.  He says that he did take his BP medicine today.     REVIEW OF SYSTEMS Constitutional Symptoms      Denies fever, chills, night sweats, weight loss, weight gain, and fatigue.  Eyes       Complains of glasses.      Denies change  in vision, eye pain, eye discharge, contact lenses, and eye surgery. Ear/Nose/Throat/Mouth       Denies hearing loss/aids, change in hearing, ear pain, ear discharge, dizziness, frequent runny nose, frequent nose bleeds, sinus problems, sore throat, hoarseness, and tooth pain or bleeding.  Respiratory       Denies dry cough, productive cough, wheezing, shortness of breath, asthma, bronchitis, and emphysema/COPD.  Cardiovascular       Denies murmurs, chest pain, and tires easily with exhertion.    Gastrointestinal       Denies stomach pain, nausea/vomiting, diarrhea, constipation, blood in bowel movements, and indigestion. Genitourniary       Denies painful urination, kidney stones, and loss of urinary control. Neurological       Denies paralysis, seizures, and fainting/blackouts. Musculoskeletal       Complains of muscle pain, joint pain, and joint stiffness.      Denies decreased range of motion, redness, swelling, muscle  weakness, and gout.  Skin       Denies bruising, unusual mles/lumps or sores, and hair/skin or nail changes.  Psych       Denies mood changes, temper/anger issues, anxiety/stress, speech problems, depression, and sleep problems.  Past History:  Past Surgical History: Last updated: 04/14/2009 SEPTOPLASTY  17YOA LAMINECTOMY  L5/S1  1993  HOSP PANCREATITIS  ETOH  08/1998  Family History: Last updated: 04/14/2009 Father: dec 62 with alcohol disease Mother A 80  hypertension, diabetes, elevated cholesterol and hypothyroidism Sister A 60 DM  Social History: Last updated: 08/24/2010 Marital Status: Married Children: One daughter Occupation: the [employer] store  Risk Factors: Alcohol Use: 0 (06/29/2010) Caffeine Use: 4 (06/29/2010) Exercise: no (06/29/2010)  Risk Factors: Smoking Status: current (06/29/2010) Packs/Day: 11/2 (06/29/2010) Cans of tobacco/wk: no (06/29/2010) Passive Smoke Exposure: no (06/29/2010)  Past Medical History: GERD Pancreatitis,  hx of HTN LBP  Family History: Reviewed history from 04/14/2009 and no changes required. Father: dec 62 with alcohol disease Mother A 80  hypertension, diabetes, elevated cholesterol and hypothyroidism Sister A 48 DM  Social History: Reviewed history from 04/14/2009 and no changes required. 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 Physical Exam General appearance: well developed, well nourished, no acute distress Head: normocephalic, atraumatic Eyes: conjunctivae and lids normal Pupils: equal, round, reactive to light Ears: normal, no lesions or deformities Nasal: mucosa pink, nonedematous, no septal deviation, turbinates normal Oral/Pharynx: tongue normal, posterior pharynx without erythema or exudate Neck: neck supple,  trachea midline, no masses Chest/Lungs: no rales, wheezes, or rhonchi bilateral, breath sounds equal without effort Heart: regular rate and  rhythm, no murmur Abdomen: soft, non-tender without obvious organomegaly Extremities: normal extremities Neurological: grossly intact and non-focal; strength RLE 4/5, LLE 5/5 Back: tender musculature right lower back, straight leg raises negative left leg, positive right leg at 30 deg, deep tendon reflexes 2+ at achilles and patella Skin: no obvious rashes or lesions MSE: oriented to time, place, and person Assessment  Assessed HYPERTENSION, BENIGN ESSENTIAL as deteriorated - Clanford Johnson MD New Problems: SCIATICA (ICD-724.3) BACK PAIN, LUMBAR (ICD-724.2)   Patient Education: Patient and/or caregiver instructed in the following: rest, Tylenol prn. The risks, benefits and possible side effects were clearly explained and discussed with the patient.  The patient verbalized clear understanding.  The patient was given instructions to return if symptoms don't improve, worsen or new changes develop.  If it is not during clinic hours and the patient cannot get back to this clinic then  the patient was told to seek medical care at an available urgent care or emergency department.  The patient verbalized understanding.   Demonstrates willingness to comply.  Plan New Medications/Changes: TRAMADOL HCL 50 MG TABS (TRAMADOL HCL) TAKE 1 TAB by mouth EVERY 6 HOURS as needed SEVERE BACK PAIN - CAUTION WILL CAUSE DROWSINESS  #12 x 0, 08/24/2010, Clanford Johnson MD MEDROL DOSEPAK (METHYLPREDNISOLONE) TAKE DOSEPAK AS DIRECTED  #1 x 0, 08/24/2010, Clanford Johnson MD CYCLOBENZAPRINE HCL 5 MG TABS (CYCLOBENZAPRINE HCL) take 1 tab by mouth up to 3 times daily as needed for back spasm and pain - Caution Will Cause Drowsiness  #15 x 0, 08/24/2010, Clanford Johnson MD LISINOPRIL-HYDROCHLOROTHIAZIDE 10-12.5 MG TABS (LISINOPRIL-HYDROCHLOROTHIAZIDE) take 1 tab by mouth daily for blood pressure  #30 x 1, 08/24/2010, Clanford Johnson MD  Planning Comments:   Recommended that the patient purchase a back brace and wear while he is working.  I told him where he could go and purchase. Recommended  patient use a lumbar support while he is sitting, esp. for longer periods of time. The patient verbalized clear understanding.   I recommended that the patient have xrays of the LS spine done.  Pt says that he will have them done with his PCP in the next 2 weeks for his scheduled appointment.  Recommended that the patient have his blood pressure retested in 5-7 days  on new medications.  Follow Up: Follow up in 2-3 days if no improvement, Follow up on an as needed basis, Follow up with Primary Physician  The patient and/or caregiver has been counseled thoroughly with regard to medications prescribed including dosage, schedule, interactions, rationale for use, and possible side effects and they verbalize understanding.  Diagnoses and expected course of recovery discussed and will return if not improved as expected or if the condition worsens. Patient and/or caregiver verbalized understanding.   Prescriptions: TRAMADOL HCL 50 MG TABS (TRAMADOL HCL) TAKE 1 TAB by mouth EVERY 6 HOURS as needed SEVERE BACK PAIN - CAUTION WILL CAUSE DROWSINESS  #12 x 0   Entered and Authorized by:   Standley Dakins MD   Signed by:   Standley Dakins MD on 08/24/2010   Method used:   Printed then faxed to ...       [id number] ([facility]ail[facility]       [address]       [place], [place]  [address]       Ph: [phone] or [phone]       Fax: [phone]   RxID:   [id number] MEDROL DOSEPAK (METHYLPREDNISOLONE) TAKE DOSEPAK AS DIRECTED  #1 x 0   Entered and Authorized by:   Standley Dakins MD   Signed by:   Standley Dakins MD on 08/24/2010   Method used:   Faxed to ...       [id number] (retail)       [address]       [place], [place]  [address]       Ph: [phone] or [phone]       Fax: [phone]   RxID:   [id number] CYCLOBENZAPRINE HCL 5 MG TABS (CYCLOBENZAPRINE HCL) take 1 tab by mouth up to 3 times daily as needed for back spasm and pain - Caution Will Cause Drowsiness  #15 x 0   Entered and Authorized by:   Standley Dakins MD   Signed by:   Standley Dakins MD on 08/24/2010   Method used:   Electronically to        [id number] ([facility]ail)       [address]       [place], [place]  [address]       Ph: [phone] or [phone]       Fax: [phone]   RxID:   [id number] LISINOPRIL-HYDROCHLOROTHIAZIDE 10-12.5 MG TABS (LISINOPRIL-HYDROCHLOROTHIAZIDE) take 1 tab by mouth daily for blood pressure  #30 x 1   Entered and Authorized by:   Standley Dakins MD   Signed by:   Standley Dakins MD on 08/24/2010   Method used:   [facility]ronically to        [id number] ([facility]ail)       [address]  [place], [place]  [address]       Ph: [phone] or [phone]       Fax: [phone]   RxID:   [id number]   Patient Instructions: 1)  Check your  Blood Pressure regularly. If it is above: 140/90 you should make an appointment. 2)  The patient was informed that there is no on-call provider or services available at this clinic during off-hours (when the clinic is closed).  If the patient developed a problem or concern that required immediate attention, the patient was advised to go the the nearest available urgent care or emergency department for medical care.  The patient verbalized understanding.    3)  No heavy lifting allowed.  Please don't lift anything over 25 lbs over the next 10 days and please wear your back brace for support at all times while working.  4)  Please be careful with the Tramadol and cyclobenzaprine as they will cause drowsiness.  Do not Drive or operate a motor vehicle if under the influence of these medications.

## 2010-10-20 NOTE — Assessment & Plan Note (Signed)
Summary: ROA -PER DR SCHALLER CYD   Vital Signs:  Patient profile:   60 year old male Weight:      179.4 pounds BMI:     25.29 Temp:     98.6 degrees F oral Pulse rate:   64 / minute Pulse rhythm:   regular BP sitting:   142 / 80  (left arm) Cuff size:   large  Vitals Entered By: Sydell Axon LPN (June 29, 2010 8:04 AM) CC: follow-up visit   History of Present Illness: Pt here for followup. He is still off cigarettes but using "smoke sticks" as a habit. He put his mother in a  home last night. His mother is on medication for BP. He had to put her in [facility] last night. She sees Silver Huguenin.  He uses Cialis for ED but only works if standing.  He has no other complaints. He has had significant stress with stomach recostruction of her daughter in [place]. She is still having stress after surgery.   Preventive Screening-Counseling & Management  Alcohol-Tobacco     Alcohol drinks/day: 0     Smoking Status: current     Smoking Cessation Counseling: yes     Packs/Day: 11/2     Cans of tobacco/week: no     Passive Smoke Exposure: no  Caffeine-Diet-Exercise     Caffeine use/day: 4     Does Patient Exercise: no  Allergies: No Known Drug Allergies  Past History:  Past Medical History: Last updated: 03/04/2007 GERD Pancreatitis, hx of  Past Surgical History: Last updated: 04/14/2009 SEPTOPLASTY  17YOA LAMINECTOMY  L5/S1  1993  HOSP PANCREATITIS  ETOH  08/1998  Family History: Last updated: 04/14/2009 Father: dec 62 with alcohol disease Mother A 80  hypertension, diabetes, elevated cholesterol and hypothyroidism Sister A 60 DM  Social History: Last updated: 04/14/2009 Marital Status: Married Children: One daughter 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 Head:  Normocephalic and atraumatic without obvious abnormalities. No apparent alopecia or balding. Sinuses NT Eyes:  Conjunctiva clear bilaterally.  Ears:  External ear exam shows no significant lesions or deformities.  Otoscopic examination reveals clear canals, tympanic membranes are intact bilaterally without bulging, retraction, inflammation or discharge. Hearing is grossly normal bilaterally. Nose:  External nasal examination shows no deformity or inflammation. Nasal mucosa are pink and moist without lesions or exudates. Mouth:  Oral mucosa and oropharynx without lesions or exudates.  Teeth in good repair. Neck:  No deformities, masses, or tenderness noted. Lungs:  Normal respiratory effort, chest expands symmetrically. Lungs are clear to auscultation, no crackles or wheezes. Heart:  Normal rate and regular rhythm. S1 and S2 normal without gallop, murmur, click, rub or other extra sounds.   Impression & Recommendations:  Problem # 1:  HYPERTENSION, BENIGN ESSENTIAL (ICD-401.1) Assessment Unchanged  BP high again today. Start Lisinopril 5mg  at night. 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 Will refer to Urology when BP better. Probably after next visit.  Problem # 3:  DISORDER, TOBACCO USE (ICD-305.1) Assessment: Unchanged Still off  cigarettes but encouraged to cut down on "sticks" as they have nicotine also.  Problem # 4:  ERECTILE DYSFUNCTION (ICD-302.72) Assessment: Unchanged Cont as needed Cialis and refer to Urology after BP better. His updated medication list for this problem includes:    Cialis 20 Mg Tabs (Tadalafil) .Marland Kitchen... 1 hour prior as needed  Complete Medication List: 1)  Lipitor 20 Mg Tabs (Atorvastatin calcium) .... Take 1 tablet by mouth once a day 2)  Vitamin C 1000 Mg Tabs (Ascorbic acid) .... Take one by mouth daily 3)  Vitamin E-400 Caps (Vitamin e caps) .... Take one by mouth daily 4)  Aspirin Ec 325 Mg Tbec (Aspirin) .... Take one by mouth daily 5)  Zinc Tabs (Zinc tabs) .... Take one by mouth daily 6)  Fish Oil Caps (Omega-3 fatty acids caps) .... Take one by mouth daily 7)  Potassium  .... Take one by mouth daily 8)  Cialis 20 Mg Tabs (Tadalafil) .Marland Kitchen.. 1 hour prior as needed 9)  Vitamin D 1000 Unit Tabs (Cholecalciferol) .... Take one by mouth daily 10)  Lisinopril 5 Mg Tabs (Lisinopril) .... One tab by mouth at night   Patient Instructions: 1)  Schedule PE when able, labs prior. 2)  RTC 6 weeks for BP check. Get Bmet prior. 401.9 Discuss ED referral. 3)  Get CXR at PE.  Prescriptions: LISINOPRIL 5 MG TABS (LISINOPRIL) one tab by mouth at night  #30 x 12   Entered and Authorized by:   Shaune Leeks MD   Signed by:   Shaune Leeks MD on 06/29/2010   Method used:   [facility]ronically to        [id number] ([facility]       [address]       [place], [place]  [address]       Ph: [phone] or [phone]       Fax: [phone]   RxID:   [id number]   Current Allergies (reviewed today): No known allergies   Appended Document: [facility] -PER DR SCHALLER CYD Check Vit D at PE.

## 2010-10-21 ENCOUNTER — Telehealth: Payer: Self-pay | Admitting: Family Medicine

## 2010-10-26 NOTE — Progress Notes (Signed)
  Phone Note Call from Patient   Summary of Call: Mr Thomas Todd was seen by Dr. Laural Benes about 3 months ago and he was already taking Lisinopril but Dr. Laural Benes increased the dose.  [name] Lisinopril has run out and [facility] said [facility] since Dr. Laural Benes prescribed the medication instead of the primary care that Dr. Laural Benes needs to refill the medication.    [facility]: [phone] Phone for patient: [phone] [employer] (wife's work) she is the one that called.   Initial call taken by: Rosine Beat,  October 21, 2010 11:14 AM  Follow-up for Phone Call        I am happy to refill the blood pressure medications this time but this is a walk-in clinic and the patient should be going back to his primary care physician for his blood pressure medication refills.  The patient saw Dr. Hetty Ely after he had seen me in December.  It would be much more appropriate to get his blood pressure meds regularly from his PCP.   [facility] Follow-up by: Standley Dakins MD,  October 21, 2010 11:22 AM    New/Updated Medications: LISINOPRIL-HYDROCHLOROTHIAZIDE 10-12.5 MG TABS (LISINOPRIL-HYDROCHLOROTHIAZIDE) take 1 by mouth daily for blood pressure Prescriptions: LISINOPRIL-HYDROCHLOROTHIAZIDE 10-12.5 MG TABS (LISINOPRIL-HYDROCHLOROTHIAZIDE) take 1 by mouth daily for blood pressure  #30 x 2   Entered and Authorized by:   Standley Dakins MD   Signed by:   Standley Dakins MD on 10/21/2010   Method used:   [facility]ronically to        [id number] ([facility]       [address]       [place], [place]  [address]       Ph: [phone] or [phone]       Fax: [phone]   RxID:   [id number]

## 2010-11-10 ENCOUNTER — Ambulatory Visit (INDEPENDENT_AMBULATORY_CARE_PROVIDER_SITE_OTHER): Payer: Self-pay | Admitting: Family Medicine

## 2010-11-10 ENCOUNTER — Encounter: Payer: Self-pay | Admitting: Family Medicine

## 2010-11-10 DIAGNOSIS — I10 Essential (primary) hypertension: Secondary | ICD-10-CM

## 2010-11-10 DIAGNOSIS — M5430 Sciatica, unspecified side: Secondary | ICD-10-CM

## 2010-11-10 DIAGNOSIS — M549 Dorsalgia, unspecified: Secondary | ICD-10-CM

## 2010-11-11 ENCOUNTER — Telehealth: Payer: Self-pay | Admitting: Family Medicine

## 2010-11-15 NOTE — Progress Notes (Signed)
  Phone Note Outgoing Call   Call placed by: Standley Dakins MD,  November 11, 2010 10:57 AM Call placed to: Patient Reason for Call: Discuss lab or test results Summary of Call: The patient was notified of the results of his Lumbar Spine Xrays.  The patient was given instructions to Return or go to the ER if no improvement or symptoms getting worse.  The patient verbalized clear understanding.  I also asked the patient to call if decided to try physical therapy and / or referral to back specialist. The patient verbalized clear understanding.   Initial call taken by: Standley Dakins MD,  November 11, 2010 10:59 AM

## 2010-11-15 NOTE — Assessment & Plan Note (Signed)
Summary: leg-back/jbb   Vital Signs:  Patient profile:   60 year old male Height:      71 inches Weight:      176 pounds O2 Sat:      99 % on Room air Temp:     98.6 degrees F oral Pulse rate:   65 / minute Pulse rhythm:   regular Resp:     13 per minute BP sitting:   135 / 90  (right arm)  O2 Flow:  Room air  Primary Care Provider:  Hetty Ely  Chief Complaint:  right leg pain.  History of Present Illness: The patient is presenting today for an evaluation and management of recurrent sciatica involving the right leg. He says that over the past several months his symptoms have become worse.  He is having a shooting pain down his right leg and associated with weakness in the right leg.  He says that this started many months ago when he lifted a buffer / sander machine with his bare hands and moved it off a truck.  He felt that he had "pulled" something at the time but then noticed the symptoms lingering.  He says that he has not lost control of bowel or bladder but says that certain positions like climbing stairs, getting up from recumbent and prone positions, lifting and pulling exacerbate the symptoms.  He says that he has had back surgery years ago from a "ruptured disc" and had similar symptoms but on the left side instead of the right side.  No gait problems reported.    Allergies: No Known Drug Allergies  Past History:  Family History: Last updated: 09/14/2010 Father: dec 62 with alcohol disease Mother A 81  hypertension, diabetes, elevated cholesterol and hypothyroidism Sister A 84 DM  Social History: Last updated: 08/24/2010 Marital Status: Married Children: One daughter Occupation: the [employer] store  Risk Factors: Alcohol Use: 0 (09/14/2010) Caffeine Use: 2 (09/14/2010) Exercise: no (09/14/2010)  Risk Factors: Smoking Status: quit > 6 months (09/14/2010) Packs/Day: <0.25 (09/14/2010) Cans of tobacco/wk: no (09/14/2010) Passive Smoke Exposure: no  (09/14/2010)  Past Medical History: GERD Pancreatitis, hx of HTN LBP with sciatica  Past Surgical History: Reviewed history from 04/14/2009 and no changes required. SEPTOPLASTY  17YOA LAMINECTOMY  L5/S1  1993  HOSP PANCREATITIS  ETOH  08/1998  Family History: Reviewed history from 09/14/2010 and no changes required. Father: dec 62 with alcohol disease Mother A 27  hypertension, diabetes, elevated cholesterol and hypothyroidism Sister A 11 DM  Social History: Reviewed history from 08/24/2010 and no changes required. 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 Eyes:  Denies blurring, discharge, double vision, eye irritation, eye pain, halos, itching, light sensitivity, red eye, vision loss-1 eye, and vision loss-both eyes. ENT:  Denies decreased hearing, difficulty swallowing, ear discharge, earache, hoarseness, nasal congestion, nosebleeds, postnasal drainage, ringing in ears, sinus pressure, and sore throat. CV:  Denies bluish discoloration of lips or nails, chest pain or discomfort, difficulty breathing at night, difficulty breathing while lying down, fainting, fatigue, leg cramps with exertion, lightheadness, near fainting, palpitations, shortness of breath with exertion, swelling of feet, swelling of hands, and weight gain. Resp:  Denies chest discomfort, chest pain with inspiration, cough, coughing up blood, excessive snoring, hypersomnolence, morning headaches, pleuritic, shortness of breath, sputum productive, and wheezing. GI:  Denies abdominal pain, bloody stools, change in bowel  habits, constipation, dark tarry stools, diarrhea, excessive appetite, gas, hemorrhoids, indigestion, loss of appetite, nausea, vomiting, vomiting blood, and yellowish skin color. GU:  Denies decreased libido, discharge, dysuria,  erectile dysfunction, genital sores, hematuria, incontinence, nocturia, urinary frequency, and urinary hesitancy. MS:  Denies joint pain, joint redness, joint swelling, loss of strength, low back pain, mid back pain, muscle aches, muscle , cramps, muscle weakness, stiffness, and thoracic pain; shotto. Derm:  Denies changes in color of skin, changes in nail beds, dryness, excessive perspiration, flushing, hair loss, insect bite(s), itching, lesion(s), poor wound healing, and rash. Neuro:  Denies brief paralysis, difficulty with concentration, disturbances in coordination, falling down, headaches, inability to speak, memory loss, numbness, poor balance, seizures, sensation of room spinning, tingling, tremors, visual disturbances, and weakness. Psych:  Denies alternate hallucination ( auditory/visual), anxiety, depression, easily angered, easily tearful, irritability, mental problems, panic attacks, sense of great danger, suicidal thoughts/plans, thoughts of violence, unusual visions or sounds, and thoughts /plans of harming others.  Physical Exam  General:  Well-developed,well-nourished,in no acute distress; alert,appropriate and cooperative throughout examination Head:  Normocephalic and atraumatic without obvious abnormalities. No apparent alopecia or balding. Sinuses NT Eyes:  Conjunctiva clear bilaterally.  Ears:  External ear exam shows no significant lesions or deformities.  Otoscopic examination reveals clear canals, tympanic membranes are intact bilaterally without bulging, retraction, inflammation or discharge. Hearing is grossly normal bilaterally. Abdomen:  Bowel sounds positive,abdomen soft and non-tender without masses, organomegaly or hernias noted. Bulge from last visit on right side resolved. Msk:  No deformity or scoliosis noted of thoracic or lumbar spine.  Tenderness in the lumbar spine with palpation.  Positive straight leg raise at 40 degrees raise of right leg; Pt has spasm and pain  down right leg; negative straight leg raise on left leg.   Extremities:  No clubbing, cyanosis, edema, or deformity noted with normal full range of motion of all joints.   Neurologic:  No cranial nerve deficits noted. Station and gait are normal.  Sensory, motor and coordinative functions appear intact. Psych:  Cognition and judgment appear intact. Alert and cooperative with normal attention span and concentration. No apparent delusions, illusions, hallucinations   Impression & Recommendations:  Problem # 1:  SCIATICA, RIGHT (ICD-724.3)  His updated medication list for this problem includes:    Aspirin Ec 325 Mg Tbec (Aspirin) .Marland Kitchen... Take one by mouth daily    Tramadol Hcl 50 Mg Tabs (Tramadol hcl) .Marland Kitchen... Take 1 by mouth every 6  hours as needed severe leg back pain: caution will cause drowsiness    Cyclobenzaprine Hcl 5 Mg Tabs (Cyclobenzaprine hcl) .Marland Kitchen... Take 1 by mouth at hs as needed back and leg spasm: caution will cause drowsiness  I ordered for the patient to go to [facility] to get a lumbar spine series done.  The patient verbalized clear understanding.    Problem # 2:  BACK PAIN (ICD-724.5)  His updated medication list for this problem includes:    Aspirin Ec 325 Mg Tbec (Aspirin) .Marland Kitchen... Take one by mouth daily    Tramadol Hcl 50 Mg Tabs (Tramadol hcl) .Marland Kitchen... Take 1 by mouth every 6  hours as needed severe leg back pain: caution will cause drowsiness    Cyclobenzaprine Hcl 5 Mg Tabs (Cyclobenzaprine hcl) .Marland Kitchen... Take 1 by mouth at hs as needed back and leg spasm: caution will cause drowsiness  Problem # 3:  HYPERTENSION, BENIGN ESSENTIAL (ICD-401.1) Assessment: Improved  His updated medication list for this problem includes:  Lisinopril-hydrochlorothiazide 10-12.5 Mg Tabs (Lisinopril-hydrochlorothiazide) .Marland Kitchen... Take 1 by mouth daily for blood pressure  Complete Medication List: 1)  Lipitor 20 Mg Tabs (Atorvastatin calcium) .... Take 1 tablet by mouth once a day 2)  Vitamin C 1000  Mg Tabs (Ascorbic acid) .... Take one by mouth daily 3)  Vitamin E-400 Caps (Vitamin e caps) .... Take one by mouth daily 4)  Aspirin Ec 325 Mg Tbec (Aspirin) .... Take one by mouth daily 5)  Zinc Tabs (Zinc tabs) .... Take one by mouth daily 6)  Fish Oil Caps (Omega-3 fatty acids caps) .... Take one by mouth daily 7)  Potassium  .... Take one by mouth daily 8)  Cialis 20 Mg Tabs (Tadalafil) .Marland Kitchen.. 1 hour prior as needed 9)  Vitamin D 1000 Unit Tabs (Cholecalciferol) .... Take one by mouth daily 10)  Lisinopril-hydrochlorothiazide 10-12.5 Mg Tabs (Lisinopril-hydrochlorothiazide) .... Take 1 by mouth daily for blood pressure 11)  Dexpak 13 Day 1.5 Mg Tabs (Dexamethasone) .... Take as directed 12)  Tramadol Hcl 50 Mg Tabs (Tramadol hcl) .... Take 1 by mouth every 6  hours as needed severe leg back pain: caution will cause drowsiness 13)  Cyclobenzaprine Hcl 5 Mg Tabs (Cyclobenzaprine hcl) .... Take 1 by mouth at hs as needed back and leg spasm: caution will cause drowsiness  Patient Instructions: 1)  Go to the pharmacy and pick up your prescription (s).  It may take up to 30 mins for electronic prescriptions to be delivered to the pharmacy.  Please call if your pharmacy has not received your prescriptions after 30 minutes.   2)  Go to get your xrays done.  Please call us at [phone] if you have not heard about your results in 3 days.   3)  Return or go to the ER if no improvement or symptoms getting worse.   4)  See your PCP Dr. Hetty Ely in 1 week to 10 days for recheck.  5)  The patient was informed that there is no on-call provider or services available at this clinic during off-hours (when the clinic is closed).  If the patient developed a problem or concern that required immediate attention, the patient was advised to go the the nearest available urgent care or emergency department for medical care.  The patient verbalized understanding.    6)  Please wear your back brace at all times when  working.  7)  No heavy lifting over 35lbs. 8)  Most patients (90%) with low back pain will improve with time (2-6 weeks). Keep active but avoid activities that are painful. Apply moist heat and/or ice to lower back several times a day. Prescriptions: TRAMADOL HCL 50 MG TABS (TRAMADOL HCL) take 1 by mouth every 6  hours as needed severe leg back pain: caution will cause drowsiness  #30 x 0   Entered and Authorized by:   Standley Dakins MD   Signed by:   Standley Dakins MD on 11/10/2010   Method used:   Print then Give to Patient   RxID:   [id number] CYCLOBENZAPRINE HCL 5 MG TABS (CYCLOBENZAPRINE HCL) take 1 by mouth at hs as needed back and leg spasm: Caution will cause drowsiness  #20 x 0   Entered and Authorized by:   Standley Dakins MD   Signed by:   Standley Dakins MD on 11/10/2010   Method used:   Print then Give to Patient   RxID:   [id number] DEXPAK 13 DAY 1.5 MG TABS (DEXAMETHASONE) take as directed  #  1 x 0   Entered and Authorized by:   Standley Dakins MD   Signed by:   Standley Dakins MD on 11/10/2010   Method used:   Print then Give to Patient   RxID:   [id number]

## 2010-11-20 ENCOUNTER — Encounter: Payer: Self-pay | Admitting: Family Medicine

## 2010-12-03 ENCOUNTER — Encounter: Payer: Self-pay | Admitting: Family Medicine

## 2010-12-06 NOTE — Letter (Signed)
Summary: x-ray orders  x-ray orders   Imported By: Rosine Beat 12/03/2010 16:41:31  _____________________________________________________________________  External Attachment:    Type:   Image     Comment:   External Document

## 2010-12-20 NOTE — Medication Information (Signed)
Summary: Prescription Refill  Prescription Refill   Imported By: Eugenio Hoes 12/12/2010 12:04:24  _____________________________________________________________________  External Attachment:    Type:   Image     Comment:   External Document

## 2011-01-18 ENCOUNTER — Encounter: Payer: Self-pay | Admitting: Family Medicine

## 2011-01-19 ENCOUNTER — Ambulatory Visit (INDEPENDENT_AMBULATORY_CARE_PROVIDER_SITE_OTHER): Payer: BC Managed Care – PPO | Admitting: Family Medicine

## 2011-01-19 ENCOUNTER — Encounter: Payer: Self-pay | Admitting: Family Medicine

## 2011-01-19 ENCOUNTER — Other Ambulatory Visit: Payer: Self-pay | Admitting: Family Medicine

## 2011-01-19 DIAGNOSIS — I10 Essential (primary) hypertension: Secondary | ICD-10-CM

## 2011-01-19 DIAGNOSIS — M549 Dorsalgia, unspecified: Secondary | ICD-10-CM

## 2011-01-19 MED ORDER — CYCLOBENZAPRINE HCL 10 MG PO TABS: 10.0000 mg | ORAL_TABLET | Freq: Three times a day (TID) | ORAL | Status: DC | PRN

## 2011-01-19 MED ORDER — IBUPROFEN 800 MG PO TABS: 800.0000 mg | ORAL_TABLET | Freq: Three times a day (TID) | ORAL | Status: DC | PRN

## 2011-01-19 NOTE — Assessment & Plan Note (Signed)
Now right sided where before prior surgery was left sided. Discussed conservative approach of heat and ice, NSAID and musc relaxer regularly for one month minimum. NSAID after eating. Call if doesn't help.

## 2011-01-19 NOTE — Progress Notes (Signed)
  Subjective:    Patient ID: Thomas Todd, male    DOB: 10-04-1950, 60 y.o.   MRN: [id number]  HPI Pt here for back pain. He had lifted a machine at his job and now he has LBP. He has radiculopathy down the right leg with pain in the right buttock. He has significant discomfort. It started about 4-5 months ago. He had been seen at [facility] with xrays that showed scoliosis and degenerated disc disease. He was given Flexeril and Prednisone which didn't do a lot. He has done nothing for the back since then except taking some IBP.    Review of Systems  Constitutional: Negative for fever, chills, diaphoresis, activity change, appetite change and fatigue.  HENT: Negative for hearing loss, ear pain, congestion, sore throat, rhinorrhea, neck pain, neck stiffness, postnasal drip, sinus pressure, tinnitus and ear discharge.   Eyes: Negative for pain, discharge and visual disturbance.  Respiratory: Negative for cough, shortness of breath and wheezing.   Cardiovascular: Negative for chest pain and palpitations.       [No SOB w/ exertion Gastrointestinal:       [No heartburn or swallowing problems. Genitourinary:       [No nocturia Musculoskeletal: Positive for back pain. Negative for myalgias, joint swelling, arthralgias and gait problem.  Skin:       [No itching or dryness. Neurological:       [No tingling or balance problems. [all other systems reviewed and are negative       Objective:   Physical Exam  Constitutional: He appears well-developed and well-nourished. No distress.  Musculoskeletal: He exhibits tenderness. He exhibits no edema.       Decreased ROM over right buttock and sciatic notch. Gait Nml. Getting up and down hurts but able. SLR pos R   Skin: He is not diaphoretic.          Assessment & Plan:

## 2011-01-19 NOTE — Patient Instructions (Signed)
IBP 800mg  after three meals daily. Flexeril 10mg  with IBP. Heat and ice regularly as directed. Exercises as shown after the first week. Call in one month if no improvement.

## 2011-01-19 NOTE — Assessment & Plan Note (Signed)
Well controlled. BP: 128/82 mmHg

## 2011-03-08 ENCOUNTER — Ambulatory Visit: Payer: Self-pay | Admitting: Family Medicine

## 2011-03-15 ENCOUNTER — Other Ambulatory Visit: Payer: Self-pay | Admitting: Family Medicine

## 2011-03-16 NOTE — Telephone Encounter (Signed)
Patient last seen 01-19-11

## 2011-03-16 NOTE — Telephone Encounter (Signed)
Please call pt.  If he isn't improving and is still needing ibuprofen, have him scheduled with Dr. Hetty Ely to talk about potential options other than long term ibuprofen (since he has HTN and is on and ACE inhibitor).  Thanks.

## 2011-03-30 ENCOUNTER — Ambulatory Visit: Payer: Self-pay | Admitting: Family Medicine

## 2011-04-10 ENCOUNTER — Other Ambulatory Visit: Payer: Self-pay | Admitting: *Deleted

## 2011-04-10 MED ORDER — ATORVASTATIN CALCIUM 20 MG PO TABS: 20.0000 mg | ORAL_TABLET | Freq: Every day | ORAL | Status: DC

## 2011-05-25 ENCOUNTER — Other Ambulatory Visit: Payer: Self-pay

## 2011-05-25 MED ORDER — LISINOPRIL-HYDROCHLOROTHIAZIDE 10-12.5 MG PO TABS: 1.0000 | ORAL_TABLET | Freq: Every day | ORAL | Status: DC

## 2011-05-25 NOTE — Telephone Encounter (Signed)
[place] pharmacy faxed refill request Lisinopril/HCTZ 10-12.5 mg #30 x 2.

## 2011-09-22 ENCOUNTER — Other Ambulatory Visit: Payer: Self-pay | Admitting: *Deleted

## 2011-09-22 MED ORDER — LISINOPRIL-HYDROCHLOROTHIAZIDE 10-12.5 MG PO TABS: 1.0000 | ORAL_TABLET | Freq: Every day | ORAL | Status: DC

## 2011-11-06 ENCOUNTER — Encounter: Payer: Self-pay | Admitting: Family Medicine

## 2011-11-06 ENCOUNTER — Ambulatory Visit (INDEPENDENT_AMBULATORY_CARE_PROVIDER_SITE_OTHER): Payer: BC Managed Care – PPO | Admitting: Family Medicine

## 2011-11-06 DIAGNOSIS — M549 Dorsalgia, unspecified: Secondary | ICD-10-CM

## 2011-11-06 MED ORDER — IBUPROFEN 600 MG PO TABS: 600.0000 mg | ORAL_TABLET | Freq: Three times a day (TID) | ORAL | Status: DC | PRN

## 2011-11-06 MED ORDER — HYDROCODONE-ACETAMINOPHEN 5-500 MG PO TABS: 1.0000 | ORAL_TABLET | Freq: Four times a day (QID) | ORAL | Status: DC | PRN

## 2011-11-06 NOTE — Patient Instructions (Signed)
Take the pain meds as directed.  Don't put your wallet in your back pocket.  If the pain isn't better, let me know so we can alter your meds/set you up with PT.  Take care.

## 2011-11-06 NOTE — Progress Notes (Signed)
61 y/o patient with h/o sciatica that got better after a prev laminectomy.  Didn't know if he strained his back getting out of the truck recently .  He had pain Sunday AM.  Pain with certain movements/positions.  No pain on L side, only on R side.  Sudden onset pain, brief duration.  Pain in R buttock, doesn't go past midthigh, posterior.  No weakness, no bowel/bladder sx.  Taking ibuprofen for pain w/o much relief.    Meds, vitals, and allergies reviewed.   ROS: See HPI.  Otherwise, noncontributory.  Nad, but uncomfortable ncat rrr ctab Pain with back extension but not forward flexion, SLR positive, no pain in internal/ext rotation of hips. Back w/o midline pain Ext w/o edema

## 2011-11-08 NOTE — Assessment & Plan Note (Signed)
D/w pt about sciatica, stretching and strengthening ie PT, and GI /sedation caution on meds.  He'll work on home exercises and call back prn.

## 2011-11-09 ENCOUNTER — Other Ambulatory Visit: Payer: Self-pay

## 2011-11-09 NOTE — Telephone Encounter (Signed)
Received fax refill request from patients pharmacy.  Okay to refill? Doesn't look like he has had bloodwork in a while.

## 2011-11-10 MED ORDER — ATORVASTATIN CALCIUM 20 MG PO TABS: 20.0000 mg | ORAL_TABLET | Freq: Every day | ORAL | Status: DC

## 2011-11-10 NOTE — Telephone Encounter (Signed)
Have pt set up a physical or OV this spring with labs ahead of time.  Thanks.

## 2011-11-10 NOTE — Telephone Encounter (Signed)
Patient advised.  Says he will phone in early next week to schedule appt.

## 2011-12-22 ENCOUNTER — Other Ambulatory Visit: Payer: Self-pay | Admitting: *Deleted

## 2011-12-22 MED ORDER — LISINOPRIL-HYDROCHLOROTHIAZIDE 10-12.5 MG PO TABS: 1.0000 | ORAL_TABLET | Freq: Every day | ORAL | Status: DC

## 2012-01-30 ENCOUNTER — Encounter: Payer: BC Managed Care – PPO | Admitting: Family Medicine

## 2012-02-02 ENCOUNTER — Other Ambulatory Visit: Payer: Self-pay | Admitting: Family Medicine

## 2012-02-02 DIAGNOSIS — I10 Essential (primary) hypertension: Secondary | ICD-10-CM

## 2012-02-02 DIAGNOSIS — R972 Elevated prostate specific antigen [PSA]: Secondary | ICD-10-CM

## 2012-02-06 ENCOUNTER — Other Ambulatory Visit (INDEPENDENT_AMBULATORY_CARE_PROVIDER_SITE_OTHER): Payer: BC Managed Care – PPO

## 2012-02-06 DIAGNOSIS — I10 Essential (primary) hypertension: Secondary | ICD-10-CM

## 2012-02-06 DIAGNOSIS — R972 Elevated prostate specific antigen [PSA]: Secondary | ICD-10-CM

## 2012-02-06 LAB — LIPID PANEL
Cholesterol: 165 mg/dL (ref 0–200)
HDL: 35.4 mg/dL — ABNORMAL LOW (ref >39.00)
LDL Cholesterol: 97 mg/dL (ref 0–99)
Total CHOL/HDL Ratio: 5
Triglycerides: 165 mg/dL — ABNORMAL HIGH (ref 0.0–149.0)
VLDL: 33 mg/dL (ref 0.0–40.0)

## 2012-02-06 LAB — COMPREHENSIVE METABOLIC PANEL
ALT: 28 U/L (ref 0–53)
AST: 17 U/L (ref 0–37)
Albumin: 4.4 g/dL (ref 3.5–5.2)
Alkaline Phosphatase: 43 U/L (ref 39–117)
BUN: 12 mg/dL (ref 6–23)
CO2: 30 mEq/L (ref 19–32)
Calcium: 9.4 mg/dL (ref 8.4–10.5)
Chloride: 103 mEq/L (ref 96–112)
Creatinine, Ser: 0.8 mg/dL (ref 0.4–1.5)
GFR: 108.4 mL/min (ref >60.00)
Glucose, Bld: 104 mg/dL — ABNORMAL HIGH (ref 70–99)
Potassium: 4.4 mEq/L (ref 3.5–5.1)
Sodium: 141 mEq/L (ref 135–145)
Total Bilirubin: 0.6 mg/dL (ref 0.3–1.2)
Total Protein: 6.3 g/dL (ref 6.0–8.3)

## 2012-02-06 LAB — PSA: PSA: 0.73 ng/mL (ref 0.10–4.00)

## 2012-02-13 ENCOUNTER — Ambulatory Visit (INDEPENDENT_AMBULATORY_CARE_PROVIDER_SITE_OTHER): Payer: BC Managed Care – PPO | Admitting: Family Medicine

## 2012-02-13 ENCOUNTER — Encounter: Payer: Self-pay | Admitting: Family Medicine

## 2012-02-13 VITALS — BP 112/64 | HR 67 | Temp 98.7°F | Wt 181.0 lb

## 2012-02-13 DIAGNOSIS — Z0000 Encounter for general adult medical examination without abnormal findings: Secondary | ICD-10-CM

## 2012-02-13 DIAGNOSIS — R7989 Other specified abnormal findings of blood chemistry: Secondary | ICD-10-CM

## 2012-02-13 DIAGNOSIS — R972 Elevated prostate specific antigen [PSA]: Secondary | ICD-10-CM

## 2012-02-13 DIAGNOSIS — Z1211 Encounter for screening for malignant neoplasm of colon: Secondary | ICD-10-CM

## 2012-02-13 DIAGNOSIS — I10 Essential (primary) hypertension: Secondary | ICD-10-CM

## 2012-02-13 DIAGNOSIS — E785 Hyperlipidemia, unspecified: Secondary | ICD-10-CM

## 2012-02-13 DIAGNOSIS — F528 Other sexual dysfunction not due to a substance or known physiological condition: Secondary | ICD-10-CM

## 2012-02-13 DIAGNOSIS — Z23 Encounter for immunization: Secondary | ICD-10-CM

## 2012-02-13 MED ORDER — ATORVASTATIN CALCIUM 20 MG PO TABS: 20.0000 mg | ORAL_TABLET | Freq: Every day | ORAL | Status: DC

## 2012-02-13 MED ORDER — LISINOPRIL-HYDROCHLOROTHIAZIDE 10-12.5 MG PO TABS: 1.0000 | ORAL_TABLET | Freq: Every day | ORAL | Status: DC

## 2012-02-13 MED ORDER — VARDENAFIL HCL 20 MG PO TABS: 10.0000 mg | ORAL_TABLET | Freq: Every day | ORAL | Status: DC | PRN

## 2012-02-13 NOTE — Assessment & Plan Note (Signed)
Routine anticipatory guidance given to patient.  See health maintenance. H/o mild elevation in PSA, resolved and normalized now.  Td done today, 2013 Flu shot encouraged.   D/w patient [id number] for colon cancer screening, including IFOB vs. colonoscopy.  Risks and benefits of both were discussed and patient voiced understanding.  Pt elects [id number].   Living will/advance directive d/w pt.  He doesn't have one yet.

## 2012-02-13 NOTE — Assessment & Plan Note (Signed)
Controlled, continue current meds.   

## 2012-02-13 NOTE — Assessment & Plan Note (Signed)
Now normalized.  DRE deferred with normalized PSA.

## 2012-02-13 NOTE — Progress Notes (Signed)
Addended by: Annamarie Major on: 02/13/2012 12:02 PM   Modules accepted: Orders

## 2012-02-13 NOTE — Assessment & Plan Note (Signed)
Mild, d/w pt about diet and exercise.  

## 2012-02-13 NOTE — Progress Notes (Signed)
CPE- See plan.  Routine anticipatory guidance given to patient.  See health maintenance. H/o mild elevation in PSA, resolved and normalized now.  Td done today, 2013 Flu shot encouraged.   D/w patient [id number] for colon cancer screening, including IFOB vs. colonoscopy.  Risks and benefits of both were discussed and patient voiced understanding.  Pt elects ONG:EXBM.   Living will/advance directive d/w pt.  He doesn't have one yet.   ED.  Stopped smoking and drinking.  Still with ED.  Taking cialis once a week.  He doesn't have great response now.  Asking about options.  Libido is good. He hadn't been on viagra recently, he had a HA with it.  Still with some AM HA after use.  He hasn't been on levitra.    Elevated Cholesterol: Using medications without problems:yes Muscle aches: no Diet compliance: yes Exercise: limited out of work, discussed  Hypertension: Using medication without problems or lightheadedness: yes Chest pain with exertion:no Edema:no Short of breath: no  PMH and SH reviewed  Meds, vitals, and allergies reviewed.   ROS: See HPI.  Otherwise negative.    GEN: nad, alert and oriented HEENT: mucous membranes moist NECK: supple w/o LA CV: rrr. PULM: ctab, no inc wob ABD: soft, +bs EXT: no edema SKIN: no acute rash

## 2012-02-13 NOTE — Patient Instructions (Signed)
I would get a flu shot each fall.   Try the levitra instead of cialis.  If not tolerated, call and we'll set up a referral to Urology.  Take care and try to exercise more.   Go to the lab on the way out.  Get the stool cards.

## 2012-02-13 NOTE — Assessment & Plan Note (Signed)
Change to levitra, if not better refer to uro.

## 2012-02-13 NOTE — Assessment & Plan Note (Signed)
Controlled except for mild inc in TG, needs to continue meds and work on weight.

## 2012-03-15 ENCOUNTER — Other Ambulatory Visit: Payer: BC Managed Care – PPO

## 2012-03-15 DIAGNOSIS — Z1211 Encounter for screening for malignant neoplasm of colon: Secondary | ICD-10-CM

## 2012-03-15 LAB — FECAL OCCULT BLOOD, IMMUNOCHEMICAL: Fecal Occult Bld: NEGATIVE

## 2012-07-17 ENCOUNTER — Other Ambulatory Visit: Payer: Self-pay | Admitting: *Deleted

## 2012-07-17 MED ORDER — IBUPROFEN 600 MG PO TABS: 600.0000 mg | ORAL_TABLET | Freq: Three times a day (TID) | ORAL | Status: AC | PRN

## 2012-07-17 MED ORDER — HYDROCODONE-ACETAMINOPHEN 5-500 MG PO TABS: 1.0000 | ORAL_TABLET | Freq: Four times a day (QID) | ORAL | Status: AC | PRN

## 2012-07-17 NOTE — Telephone Encounter (Signed)
Ibuprofen sent, please call in vicodin.

## 2012-07-18 NOTE — Telephone Encounter (Signed)
Medication phoned to pharmacy.  

## 2012-07-26 ENCOUNTER — Ambulatory Visit (INDEPENDENT_AMBULATORY_CARE_PROVIDER_SITE_OTHER): Payer: BC Managed Care – PPO

## 2012-07-26 DIAGNOSIS — Z23 Encounter for immunization: Secondary | ICD-10-CM

## 2012-08-07 ENCOUNTER — Encounter: Payer: Self-pay | Admitting: *Deleted

## 2012-08-07 ENCOUNTER — Encounter: Payer: Self-pay | Admitting: Family Medicine

## 2012-08-07 ENCOUNTER — Ambulatory Visit (INDEPENDENT_AMBULATORY_CARE_PROVIDER_SITE_OTHER): Payer: BC Managed Care – PPO | Admitting: Family Medicine

## 2012-08-07 VITALS — BP 140/76 | HR 89 | Temp 98.2°F | Ht 71.0 in | Wt 179.8 lb

## 2012-08-07 DIAGNOSIS — M5416 Radiculopathy, lumbar region: Secondary | ICD-10-CM

## 2012-08-07 DIAGNOSIS — IMO0002 Reserved for concepts with insufficient information to code with codable children: Secondary | ICD-10-CM

## 2012-08-07 MED ORDER — PREDNISONE 20 MG PO TABS: ORAL_TABLET | ORAL | Status: DC

## 2012-08-07 MED ORDER — CYCLOBENZAPRINE HCL 10 MG PO TABS: 10.0000 mg | ORAL_TABLET | Freq: Every day | ORAL | Status: DC

## 2012-08-07 MED ORDER — HYDROCODONE-ACETAMINOPHEN 5-500 MG PO TABS: 1.0000 | ORAL_TABLET | Freq: Four times a day (QID) | ORAL | Status: DC | PRN

## 2012-08-07 NOTE — Progress Notes (Signed)
[facility] at [facility] [address] [place] [address] Phone: [phone] Fax: [phone]  Date:  08/07/2012   Name:  Thomas Todd   DOB:  November 14, 1950   MRN:  [id number] Gender: male Age: 61 y.o.  PCP:  Crawford Givens, MD  Evaluating MD: Hannah Beat, MD   Chief Complaint: Back Pain   History of Present Illness:  Thomas Todd is a 61 y.o. pleasant patient who presents with the following:  H/o sciatica with a history of L5/S1 laminectomy in 1993:  Sand and finishes oak floors, pain is started on around L5 --- at the very lowest back. Having a hard time walking today, and having a hard time to even get straightened out.   The patient had to lay down on the floor and had a great difficulty time getting up  Earlier today. He is having radiculopathy on the RIGHT side down the entirety of his leg. No numbness. No focal weakness. Minimal ability to move at the waist. History is significant for above prior surgery. No bowel or bladder incontinence. No falls. No other acute injury or trauma.  Patient Active Problem List  Diagnosis  . TESTOSTERONE DEFICIENCY  . UNSPECIFIED VITAMIN D DEFICIENCY  . HYPERLIPIDEMIA NEC/NOS  . ANEMIA, VITAMIN B12 DEFICIENCY  . ERECTILE DYSFUNCTION  . HYPERTENSION, BENIGN ESSENTIAL  . GERD  . 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 BACK PAIN  . HYPERGLYCEMIA  . ELEVATED PROSTATE SPECIFIC ANTIGEN  . TOBACCO ABUSE, HX OF  . Routine general medical examination at a health care facility    Past Medical History  Diagnosis Date  . GERD (gastroesophageal reflux disease)   . Pancreatitis     Pancreatitis etoh 08/1998  . Hypertension   . LBP (low back pain)     with sciatica   . Hyperlipidemia     Past Surgical History  Procedure Date  . Septoplasty   . Laminectomy 1993    L5/S1    History  Substance Use Topics  . Smoking status: Former Smoker    Quit date: 09/17/2009  . Smokeless tobacco: Never Used  .  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 Hypertension Mother   . Diabetes Mother   . Hyperlipidemia Mother   . Hypothyroidism Mother   . Alcohol abuse Father   . Heart disease Father   . Diabetes Sister   . Colon cancer Neg Hx   . Prostate cancer Neg Hx     No Known Allergies  Medication list has been reviewed and updated.  Outpatient Prescriptions Prior to Visit  Medication Sig Dispense Refill  . aspirin 81 MG tablet Take 81 mg by mouth daily.      Marland Kitchen atorvastatin (LIPITOR) 20 MG tablet Take 1 tablet (20 mg total) by mouth daily.  90 tablet  3  . cholecalciferol (VITAMIN D) 1000 UNITS tablet Take 1,000 Units by mouth daily.        Marland Kitchen docusate sodium (COLACE) 100 MG capsule Take 100 mg by mouth 3 (three) times daily as needed.      Marland Kitchen lisinopril-hydrochlorothiazide (PRINZIDE,ZESTORETIC) 10-12.5 MG per tablet Take 1 tablet by mouth daily.  90 tablet  3  . Omega-3 Fatty Acids (FISH OIL PO) Take by mouth daily.        . potassium chloride (MICRO-K) 10 MEQ CR capsule Take 10 mEq by mouth 2 (  two) times daily.      . [DISCONTINUED] vardenafil (LEVITRA) 20 MG tablet Take 0.5-1 tablets (10-20 mg total) by mouth daily as needed.  4 tablet  5   Last reviewed on 08/07/2012  3:46 PM by Consuello Masse, CMA  Review of Systems:   GEN: No fevers, chills. Nontoxic. Primarily MSK c/o today. MSK: Detailed in the HPI GI: tolerating PO intake without difficulty Neuro: detailed above Otherwise the pertinent positives of the ROS are noted above.    Physical Examination: Filed Vitals:   08/07/12 1541  BP: 140/76  Pulse: 89  Temp: 98.2 F (36.8 C)  TempSrc: Oral  Height: 5\' 11"  (1.803 m)  Weight: 179 lb 12 oz (81.534 kg)  SpO2: 98%    Body mass index is 25.07 kg/(m^2). Ideal Body Weight: Weight in (lb) to have BMI = 25: 178.9    GEN: Well-developed,well-nourished,in no acute distress; alert,appropriate and cooperative  throughout examination HEENT: Normocephalic and atraumatic without obvious abnormalities. Ears, externally no deformities PULM: Breathing comfortably in no respiratory distress EXT: No clubbing, cyanosis, or edema PSYCH: Normally interactive. Cooperative during the interview. Pleasant. Friendly and conversant. Not anxious or depressed appearing. Normal, full affect.  Range of motion at  the waist: Flexion, extension, lateral bending and rotation: minimal motion now  No echymosis or edema Rises to examination table with mild difficulty Gait: minimally antalgic  Inspection/Deformity: N Paraspinus Tenderness: ttp around L 5 mostly  B Ankle Dorsiflexion (L5,4): 5/5 B Great Toe Dorsiflexion (L5,4): 5/5 Heel Walk (L5): WNL Toe Walk (S1): WNL Rise/Squat (L4): WNL, mild pain  SENSORY B Medial Foot (L4): WNL B Dorsum (L5): WNL B Lateral (S1): WNL Light Touch: WNL Pinprick: WNL  REFLEXES Knee (L4): 2+ Ankle (S1): 2+  B SLR, seated: pos, R B SLR, supine: pos R B Greater Troch: NT B Log Roll: neg B Sciatic Notch: NT  The patient reports that he previously saw a chiropractor and had x-rays within the last year or so, he did have multiple levels of degenerative disc disease. Not available for my review.  Assessment and Plan:  1. Lumbar radicular pain    Worsened radicular pain. No focal neurological findings or red flags.  2 weeks of oral steroids, pain medication as needed. Muscle relaxants at night. Range of motion as tolerated. Out of work for 2 weeks and reevaluate in 3 weeks.  Orders Today:  No orders of the defined types were placed in this encounter.    Updated Medication List: (Includes new medications, updates to list, dose adjustments) Meds ordered this encounter  Medications  . DISCONTD: HYDROcodone-acetaminophen (VICODIN) 5-500 MG per tablet    Sig: Take 1 tablet by mouth every 6 (six) hours as needed.  Marland Kitchen ibuprofen (ADVIL,MOTRIN) 600 MG tablet    Sig: Take 600  mg by mouth every 6 (six) hours as needed.  . predniSONE (DELTASONE) 20 MG tablet    Sig: 2 tabs po daily for 1 week, then 1 tab po daily    Dispense:  21 tablet    Refill:  0  . HYDROcodone-acetaminophen (VICODIN) 5-500 MG per tablet    Sig: Take 1 tablet by mouth every 6 (six) hours as needed.    Dispense:  40 tablet    Refill:  1  . cyclobenzaprine (FLEXERIL) 10 MG tablet    Sig: Take 1 tablet (10 mg total) by mouth at bedtime.    Dispense:  30 tablet    Refill:  1    Medications  Discontinued: Medications Discontinued During This Encounter  Medication Reason  . vardenafil (LEVITRA) 20 MG tablet Error  . HYDROcodone-acetaminophen (VICODIN) 5-500 MG per tablet Reorder     Hannah Beat, MD

## 2012-08-19 ENCOUNTER — Telehealth: Payer: Self-pay | Admitting: Family Medicine

## 2012-08-19 NOTE — Telephone Encounter (Signed)
Patient Information:  Caller Name: Joyce Gross  Phone: [phone]  Patient: Thomas Todd, Thomas Todd  Gender: Male  DOB: 1951-04-12  Age: 61 Years  PCP: Hannah Beat (Family Practice)   Symptoms  Reason For Call & Symptoms: continues to have leg pain, was seen in the office 2 weeks ago for same  Reviewed Health History In EMR: Yes  Reviewed Medications In EMR: Yes  Reviewed Allergies In EMR: Yes  Reviewed Surgeries / Procedures: Yes  Date of Onset of Symptoms: 08/07/2012  Guideline(s) Used:  Leg Pain  Disposition Per Guideline:   See Today or Tomorrow in Office  Reason For Disposition Reached:   Numbness in a leg or foot (i.e., loss of sensation)  Advice Given:  N/A  Office Follow Up:  Does the office need to follow up with this patient?: No  Instructions For The Office: N/A  Appointment Scheduled:  08/20/2012 10:45:56 Appointment Scheduled Provider:  Para March  No appointments left for today.  Schedule for am.  His mother is actively dying and they are staying at [employer] most of the day and night.  She will see that he gets to the appointment unless something changes with his mom and she will call.

## 2012-08-19 NOTE — Telephone Encounter (Signed)
I called pt.  It would be reasonable to recheck him, possibly tomorrow, but with his mother's condition this may not be possible.  He has pain medicine for now and he'll notify us as his family situation continues.

## 2012-08-20 ENCOUNTER — Encounter: Payer: Self-pay | Admitting: Family Medicine

## 2012-08-20 ENCOUNTER — Ambulatory Visit (INDEPENDENT_AMBULATORY_CARE_PROVIDER_SITE_OTHER): Payer: BC Managed Care – PPO | Admitting: Family Medicine

## 2012-08-20 VITALS — BP 126/84 | HR 98 | Temp 98.8°F | Wt 177.0 lb

## 2012-08-20 DIAGNOSIS — M5410 Radiculopathy, site unspecified: Secondary | ICD-10-CM

## 2012-08-20 DIAGNOSIS — IMO0002 Reserved for concepts with insufficient information to code with codable children: Secondary | ICD-10-CM

## 2012-08-20 MED ORDER — CYCLOBENZAPRINE HCL 10 MG PO TABS: 10.0000 mg | ORAL_TABLET | Freq: Every day | ORAL | Status: DC

## 2012-08-20 MED ORDER — HYDROCODONE-ACETAMINOPHEN 5-500 MG PO TABS: ORAL_TABLET | ORAL | Status: DC

## 2012-08-20 NOTE — Patient Instructions (Signed)
See Shirlee Limerick about your referral before you leave today. We'll contact you with your MRI report and we'll go from there.   Take the flexeril and vicodin as needed.

## 2012-08-20 NOTE — Progress Notes (Signed)
Pain started after lifting. He lifted with correct technique.  Prev note reviewed from Dr. Patsy Lager.    When placed on prednisone initially he didn't get much relief.  He had some improvement in leg pain during the second week of prednisone but not complete relief. Hydrocodone helps some, temporarily.   Still with R leg (not L) pain anteriorly, medially, in groin.   He's had mult episodes of similar this year.    His mother died last night. He was tearful but appropriate.   Meds, vitals, and allergies reviewed.   ROS: See HPI.  Otherwise, noncontributory.  tearful but regains compsure Uncomfortable rrr ctab R quad cramp noted and medial thigh ttp Some altered sensation in R quad area.   Pain with back extension.  No rash.  DTRs wnl.  Normal plantar and dorsiflexion BLE No BLE edema

## 2012-08-21 ENCOUNTER — Encounter: Payer: Self-pay | Admitting: Family Medicine

## 2012-08-21 DIAGNOSIS — M5410 Radiculopathy, site unspecified: Secondary | ICD-10-CM | POA: Insufficient documentation

## 2012-08-21 NOTE — Assessment & Plan Note (Signed)
With active, palpable muscle cramping in quad.  H/o laminectomy noted. Ongoing pain, mult episodes.  Would send for MR, continue hydrocodone and flexeril and we'll await imaging.  Out of work in meantime due to pain, sedation caution on meds. He agrees.

## 2012-08-23 ENCOUNTER — Other Ambulatory Visit: Payer: Self-pay | Admitting: Family Medicine

## 2012-08-23 MED ORDER — CYCLOBENZAPRINE HCL 10 MG PO TABS: 10.0000 mg | ORAL_TABLET | Freq: Every day | ORAL | Status: DC

## 2012-08-23 MED ORDER — HYDROCODONE-ACETAMINOPHEN 5-500 MG PO TABS: ORAL_TABLET | ORAL | Status: DC

## 2012-08-23 NOTE — Addendum Note (Signed)
Addended by: Patience Musca on: 08/23/2012 10:33 AM   Modules accepted: Orders

## 2012-08-23 NOTE — Telephone Encounter (Signed)
Medication phoned to pharmacy. Patient advised.  

## 2012-08-23 NOTE — Telephone Encounter (Signed)
I would still try the flexeril.  I sent that rx. Please call in the vicodin.  Thanks.

## 2012-08-23 NOTE — Addendum Note (Signed)
Addended by: Joaquim Nam on: 08/23/2012 01:49 PM   Modules accepted: Orders

## 2012-08-23 NOTE — Telephone Encounter (Signed)
Addedum would not allow me to list refill of Hydrocodone.

## 2012-08-23 NOTE — Telephone Encounter (Signed)
Caller: Kay/Spouse; Phone: [phone]; Reason for Call: Spouse/Kay calling about pain medication Rx.  States patient is scheduled for MRI 08/24/12, but will run out of medication before the weekend is over.  States Dr.  Para March told patient to just call for refill if needed.  Currently taking vicodin 5/500.  States he also has spasms in R leg, and wonders if flexeril is necessary as well.  INfo to office for provider for review/Rx refill/callback.  Uses [facility].  May reach patient at [phone] or home [phone].  Krs/can

## 2012-08-24 ENCOUNTER — Ambulatory Visit
Admission: RE | Admit: 2012-08-24 | Discharge: 2012-08-24 | Disposition: A | Discharge status: Home / Self Care | Payer: BC Managed Care – PPO | Source: Ambulatory Visit | Attending: Family Medicine | Admitting: Family Medicine

## 2012-08-24 DIAGNOSIS — M5410 Radiculopathy, site unspecified: Secondary | ICD-10-CM

## 2012-08-25 NOTE — Addendum Note (Signed)
Addended by: Joaquim Nam on: 08/25/2012 10:02 AM   Modules accepted: Orders

## 2012-08-28 ENCOUNTER — Ambulatory Visit: Payer: BC Managed Care – PPO | Admitting: Family Medicine

## 2012-10-18 ENCOUNTER — Encounter: Payer: Self-pay | Admitting: Family Medicine

## 2013-02-20 ENCOUNTER — Other Ambulatory Visit: Payer: Self-pay | Admitting: Family Medicine

## 2013-02-20 MED ORDER — LISINOPRIL-HYDROCHLOROTHIAZIDE 10-12.5 MG PO TABS: 1.0000 | ORAL_TABLET | Freq: Every day | ORAL | Status: DC

## 2013-03-07 ENCOUNTER — Other Ambulatory Visit: Payer: Self-pay | Admitting: *Deleted

## 2013-03-07 MED ORDER — ATORVASTATIN CALCIUM 20 MG PO TABS: 20.0000 mg | ORAL_TABLET | Freq: Every day | ORAL | Status: DC

## 2013-06-12 ENCOUNTER — Other Ambulatory Visit: Payer: Self-pay | Admitting: Family Medicine

## 2013-07-30 ENCOUNTER — Ambulatory Visit (INDEPENDENT_AMBULATORY_CARE_PROVIDER_SITE_OTHER): Payer: BC Managed Care – PPO

## 2013-07-30 DIAGNOSIS — Z23 Encounter for immunization: Secondary | ICD-10-CM

## 2013-08-01 ENCOUNTER — Other Ambulatory Visit (INDEPENDENT_AMBULATORY_CARE_PROVIDER_SITE_OTHER): Payer: BC Managed Care – PPO

## 2013-08-01 ENCOUNTER — Other Ambulatory Visit: Payer: Self-pay | Admitting: Family Medicine

## 2013-08-01 DIAGNOSIS — E538 Deficiency of other specified B group vitamins: Secondary | ICD-10-CM

## 2013-08-01 DIAGNOSIS — Z125 Encounter for screening for malignant neoplasm of prostate: Secondary | ICD-10-CM

## 2013-08-01 DIAGNOSIS — E785 Hyperlipidemia, unspecified: Secondary | ICD-10-CM

## 2013-08-01 LAB — COMPREHENSIVE METABOLIC PANEL
ALT: 38 U/L (ref 0–53)
AST: 23 U/L (ref 0–37)
Albumin: 4.2 g/dL (ref 3.5–5.2)
Alkaline Phosphatase: 36 U/L — ABNORMAL LOW (ref 39–117)
BUN: 13 mg/dL (ref 6–23)
CO2: 29 mEq/L (ref 19–32)
Calcium: 9.7 mg/dL (ref 8.4–10.5)
Chloride: 104 mEq/L (ref 96–112)
Creatinine, Ser: 0.9 mg/dL (ref 0.4–1.5)
GFR: 86.96 mL/min (ref >60.00)
Glucose, Bld: 125 mg/dL — ABNORMAL HIGH (ref 70–99)
Potassium: 4.6 mEq/L (ref 3.5–5.1)
Sodium: 138 mEq/L (ref 135–145)
Total Bilirubin: 0.8 mg/dL (ref 0.3–1.2)
Total Protein: 6.5 g/dL (ref 6.0–8.3)

## 2013-08-01 LAB — LIPID PANEL
Cholesterol: 181 mg/dL (ref 0–200)
HDL: 37.7 mg/dL — ABNORMAL LOW (ref >39.00)
LDL Cholesterol: 105 mg/dL — ABNORMAL HIGH (ref 0–99)
Total CHOL/HDL Ratio: 5
Triglycerides: 193 mg/dL — ABNORMAL HIGH (ref 0.0–149.0)
VLDL: 38.6 mg/dL (ref 0.0–40.0)

## 2013-08-01 LAB — PSA: PSA: 0.61 ng/mL (ref 0.10–4.00)

## 2013-08-01 LAB — VITAMIN B12: Vitamin B-12: 262 pg/mL (ref 211–911)

## 2013-08-04 ENCOUNTER — Ambulatory Visit (INDEPENDENT_AMBULATORY_CARE_PROVIDER_SITE_OTHER): Payer: BC Managed Care – PPO | Admitting: Family Medicine

## 2013-08-04 ENCOUNTER — Other Ambulatory Visit: Payer: Self-pay | Admitting: Family Medicine

## 2013-08-04 ENCOUNTER — Encounter: Payer: Self-pay | Admitting: Family Medicine

## 2013-08-04 VITALS — BP 130/80 | HR 84 | Temp 98.5°F | Ht 71.25 in | Wt 188.5 lb

## 2013-08-04 DIAGNOSIS — D518 Other vitamin B12 deficiency anemias: Secondary | ICD-10-CM

## 2013-08-04 DIAGNOSIS — R739 Hyperglycemia, unspecified: Secondary | ICD-10-CM

## 2013-08-04 DIAGNOSIS — I10 Essential (primary) hypertension: Secondary | ICD-10-CM

## 2013-08-04 DIAGNOSIS — R7309 Other abnormal glucose: Secondary | ICD-10-CM

## 2013-08-04 DIAGNOSIS — Z0000 Encounter for general adult medical examination without abnormal findings: Secondary | ICD-10-CM

## 2013-08-04 DIAGNOSIS — E785 Hyperlipidemia, unspecified: Secondary | ICD-10-CM

## 2013-08-04 DIAGNOSIS — R7989 Other specified abnormal findings of blood chemistry: Secondary | ICD-10-CM

## 2013-08-04 DIAGNOSIS — Z1211 Encounter for screening for malignant neoplasm of colon: Secondary | ICD-10-CM

## 2013-08-04 LAB — HEMOGLOBIN A1C: Hgb A1c MFr Bld: 6.4 % (ref 4.6–6.5)

## 2013-08-04 MED ORDER — ATORVASTATIN CALCIUM 20 MG PO TABS: 20.0000 mg | ORAL_TABLET | Freq: Every day | ORAL | Status: DC

## 2013-08-04 MED ORDER — VITAMIN B-12 1000 MCG PO TABS: 1000.0000 ug | ORAL_TABLET | Freq: Every day | ORAL | Status: DC

## 2013-08-04 MED ORDER — LISINOPRIL-HYDROCHLOROTHIAZIDE 10-12.5 MG PO TABS: 1.0000 | ORAL_TABLET | Freq: Every day | ORAL | Status: DC

## 2013-08-04 MED ORDER — POTASSIUM CHLORIDE ER 10 MEQ PO CPCR: 10.0000 meq | ORAL_CAPSULE | Freq: Two times a day (BID) | ORAL | Status: DC

## 2013-08-04 NOTE — Assessment & Plan Note (Signed)
Controlled, continue current meds.   

## 2013-08-04 NOTE — Assessment & Plan Note (Signed)
Restart B12 replacement.  D/w pt.

## 2013-08-04 NOTE — Assessment & Plan Note (Signed)
D/w pt about DM2 path/phys and labs.  See notes on A1c.

## 2013-08-04 NOTE — Patient Instructions (Addendum)
Check with your insurance to see if they will cover the shingles shot. Take of B12 a day.  Go to the lab on the way out.  We'll contact you with your lab report.  We'll go from there about your sugar.  In the meantime cut out the "whites."  Corn/crackers/bread/potatoes/rice/pasta.  Walk more for exercise.   Take care.

## 2013-08-04 NOTE — Assessment & Plan Note (Signed)
Controlled except for TGs, continue current meds and work on weight/diet/exercise.

## 2013-08-04 NOTE — Assessment & Plan Note (Signed)
Routine anticipatory guidance given to patient.  See health maintenance. Tetanus 2013 Flu shot done 2014 Shingles shot encouraged.   Living will d/w pt.  Would have wife designated if incapacitated.   D/w patient [id number] for colon cancer screening, including IFOB vs. colonoscopy.  Risks and benefits of both were discussed and patient voiced understanding.  Pt elects for: IFOB Prostate cancer screening and PSA options (with potential risks and benefits of testing vs not testing) were discussed along with recent recs/guidelines.  PSA wnl.  No LUTS.   Diet and exercise d/w pt, encouraged both.

## 2013-08-04 NOTE — Progress Notes (Signed)
Pre-visit discussion using our clinic review tool. No additional management support is needed unless otherwise documented below in the visit note.  CPE- See plan.  Routine anticipatory guidance given to patient.  See health maintenance. Tetanus 2013 Flu shot done 2014 Shingles shot encouraged.   Living will d/w pt.  Would have wife designated if incapacitated.   D/w patient [id number] for colon cancer screening, including IFOB vs. colonoscopy.  Risks and benefits of both were discussed and patient voiced understanding.  Pt elects for: IFOB Prostate cancer screening and PSA options (with potential risks and benefits of testing vs not testing) were discussed along with recent recs/guidelines.  PSA wnl.  No LUTS.   Diet and exercise d/w pt, encouraged both.    Hypertension:    Using medication without problems or lightheadedness: yes Chest pain with exertion:no Edema:no Short of breath:no  Elevated Cholesterol: Using medications without problems: yes Muscle aches: no Diet compliance: "all right I guess" Exercise: "none"  Hyperglycemia noted on labs.  A1c pending.  D/w pt about walking for exercise and low carb diet.  Discussed DM2 basics re: path/phys.   Low B12 noted.  D/w pt.  Not on replacement currently.   PMH and SH reviewed  Meds, vitals, and allergies reviewed.   ROS: See HPI.  Otherwise negative.    GEN: nad, alert and oriented HEENT: mucous membranes moist NECK: supple w/o LA CV: rrr. PULM: ctab, no inc wob ABD: soft, +bs EXT: no edema SKIN: no acute rash

## 2013-08-05 ENCOUNTER — Encounter: Payer: Self-pay | Admitting: *Deleted

## 2013-08-13 ENCOUNTER — Other Ambulatory Visit: Payer: Self-pay | Admitting: Family Medicine

## 2013-08-13 ENCOUNTER — Other Ambulatory Visit (INDEPENDENT_AMBULATORY_CARE_PROVIDER_SITE_OTHER): Payer: BC Managed Care – PPO

## 2013-08-13 DIAGNOSIS — Z1211 Encounter for screening for malignant neoplasm of colon: Secondary | ICD-10-CM

## 2013-08-13 DIAGNOSIS — R195 Other fecal abnormalities: Secondary | ICD-10-CM

## 2013-08-13 LAB — FECAL OCCULT BLOOD, IMMUNOCHEMICAL: Fecal Occult Bld: POSITIVE

## 2013-10-03 ENCOUNTER — Encounter: Payer: Self-pay | Admitting: Family Medicine

## 2013-10-07 ENCOUNTER — Telehealth: Payer: Self-pay | Admitting: Family Medicine

## 2013-10-07 NOTE — Telephone Encounter (Signed)
Message copied by Tonia Ghent on Tue Oct 07, 2013 12:20 AM ------      Message from: Josetta Huddle      Created: Mon Oct 06, 2013 10:37 AM       Patient answered his cell phone and said his wife will be calling, they are working on insurance coverage.      ----- Message -----         From: Tonia Ghent, MD         Sent: 10/05/2013   3:18 PM           To: Josetta Huddle, CMA            Please send a certified letter to patient if you can't get him on the phone.  If you can get him on the phone, please document that.  IFOB was positive previously and he was referred but GI can't get in touch with him.  He needs to call GI about eval.              Thanks.         ------

## 2013-10-22 ENCOUNTER — Encounter: Payer: Self-pay | Admitting: Family Medicine

## 2014-01-28 ENCOUNTER — Other Ambulatory Visit (INDEPENDENT_AMBULATORY_CARE_PROVIDER_SITE_OTHER): Payer: BC Managed Care – PPO

## 2014-01-28 DIAGNOSIS — R7989 Other specified abnormal findings of blood chemistry: Secondary | ICD-10-CM

## 2014-01-28 LAB — GLUCOSE, RANDOM: Glucose, Bld: 116 mg/dL — ABNORMAL HIGH (ref 70–99)

## 2014-01-28 LAB — HEMOGLOBIN A1C: Hgb A1c MFr Bld: 6.3 % (ref 4.6–6.5)

## 2014-02-02 ENCOUNTER — Encounter: Payer: Self-pay | Admitting: Family Medicine

## 2014-02-02 ENCOUNTER — Ambulatory Visit (INDEPENDENT_AMBULATORY_CARE_PROVIDER_SITE_OTHER): Payer: BC Managed Care – PPO | Admitting: Family Medicine

## 2014-02-02 ENCOUNTER — Encounter: Payer: Self-pay | Admitting: Gastroenterology

## 2014-02-02 ENCOUNTER — Encounter: Payer: Self-pay | Admitting: Internal Medicine

## 2014-02-02 VITALS — BP 120/74 | HR 81 | Temp 98.4°F | Wt 182.0 lb

## 2014-02-02 DIAGNOSIS — F528 Other sexual dysfunction not due to a substance or known physiological condition: Secondary | ICD-10-CM

## 2014-02-02 DIAGNOSIS — R7989 Other specified abnormal findings of blood chemistry: Secondary | ICD-10-CM

## 2014-02-02 DIAGNOSIS — E785 Hyperlipidemia, unspecified: Secondary | ICD-10-CM

## 2014-02-02 DIAGNOSIS — Z1211 Encounter for screening for malignant neoplasm of colon: Secondary | ICD-10-CM

## 2014-02-02 MED ORDER — SILDENAFIL CITRATE 100 MG PO TABS: 50.0000 mg | ORAL_TABLET | Freq: Every day | ORAL | Status: DC | PRN

## 2014-02-02 NOTE — Assessment & Plan Note (Signed)
No claudication.  D/w pt.  Would try off statin for now and report back.  He agrees.

## 2014-02-02 NOTE — Progress Notes (Signed)
Pre visit review using our clinic review tool, if applicable. No additional management support is needed unless otherwise documented below in the visit note.  He couldn't get coverage for his colonoscopy.  Discussed.  Prev IFOB positive.  See plan.    He quit his job about 10 days ago.  "I'm retired."  His work stress level is improved but his daughter is still chronically ill.  Discussed.   Hyperglycemia d/w pt.  Weight is down.  A1c similar to prev but not worse.  Trying to avoid sweets, carbs.  Exercise d/w pt.   Lateral side of the feet can cramp episodically.  Happens at rest, not with activity.  Worse at night. He has cramps in his legs at night, too. On statin.   ED noted. Had used viagra prev w/o ADE, had worked well.  Wanted a refill.   Meds, vitals, and allergies reviewed.   ROS: See HPI.  Otherwise, noncontributory.  GEN: nad, alert and oriented HEENT: mucous membranes moist NECK: supple w/o LA CV: rrr. PULM: ctab, no inc wob ABD: soft, +bs EXT: no edema SKIN: no acute rash  Diabetic foot exam: Normal inspection No skin breakdown No calluses  Normal DP pulses Normal sensation to light touch and monofilament Nails normal

## 2014-02-02 NOTE — Patient Instructions (Signed)
Stop the atorvastatin for about two weeks and let me know if the cramps improve.  Let me do some checking on a colonoscopy and we'll be in touch.  Recheck in about 6 months at a physical.  Take care.  Glad to see you.

## 2014-02-02 NOTE — Assessment & Plan Note (Signed)
Hyperglycemia d/w pt.  Weight is down.  A1c similar to prev but not worse.  Trying to avoid sweets, carbs.  Exercise d/w pt.

## 2014-02-02 NOTE — Assessment & Plan Note (Signed)
Restart viagra.

## 2014-02-16 ENCOUNTER — Telehealth: Payer: Self-pay

## 2014-02-16 MED ORDER — ATORVASTATIN CALCIUM 20 MG PO TABS: 10.0000 mg | ORAL_TABLET | Freq: Every day | ORAL | Status: DC

## 2014-02-16 NOTE — Telephone Encounter (Signed)
Pt left v/m; on 02/02/14 pt was advised to stop atorvastatin due to cramping; pt said since stopping the atorvastatin cramps are a lot better.

## 2014-02-16 NOTE — Telephone Encounter (Signed)
Patient notified as instructed by telephone. 

## 2014-02-16 NOTE — Telephone Encounter (Signed)
Noted, see if he can tolerate a 1/2 tab a day.  If he can, proceed. If he can't, then stop it and notify us.  Thanks.

## 2014-03-17 ENCOUNTER — Ambulatory Visit (AMBULATORY_SURGERY_CENTER): Payer: Self-pay | Admitting: *Deleted

## 2014-03-17 VITALS — Ht 69.0 in | Wt 187.0 lb

## 2014-03-17 DIAGNOSIS — Z1211 Encounter for screening for malignant neoplasm of colon: Secondary | ICD-10-CM

## 2014-03-17 MED ORDER — MOVIPREP 100 G PO SOLR: ORAL | Status: DC

## 2014-03-17 NOTE — Progress Notes (Signed)
No allergies to eggs or soy. No problems with anesthesia.  Pt given Emmi instructions for colonoscopy  No oxygen use  No diet drug use  

## 2014-03-18 ENCOUNTER — Encounter: Payer: Self-pay | Admitting: Gastroenterology

## 2014-03-31 ENCOUNTER — Ambulatory Visit (AMBULATORY_SURGERY_CENTER): Payer: BC Managed Care – PPO | Admitting: Gastroenterology

## 2014-03-31 ENCOUNTER — Encounter: Payer: Self-pay | Admitting: Gastroenterology

## 2014-03-31 VITALS — BP 141/81 | HR 61 | Temp 97.4°F | Resp 18 | Ht 69.0 in | Wt 187.0 lb

## 2014-03-31 DIAGNOSIS — D126 Benign neoplasm of colon, unspecified: Secondary | ICD-10-CM

## 2014-03-31 DIAGNOSIS — Z1211 Encounter for screening for malignant neoplasm of colon: Secondary | ICD-10-CM

## 2014-03-31 MED ORDER — SODIUM CHLORIDE 0.9 % IV SOLN: 500.0000 mL | INTRAVENOUS | Status: DC

## 2014-03-31 NOTE — Progress Notes (Signed)
Report to PACU, RN, vss, BBS= Clear.  

## 2014-03-31 NOTE — Patient Instructions (Signed)
Discharge instructions given with verbal understanding. Handout on polyps. Resume previous medications. YOU HAD AN ENDOSCOPIC PROCEDURE TODAY AT THE [facility]: Refer to the procedure report that was given to you for any specific questions about what was found during the examination.  If the procedure report does not answer your questions, please call your gastroenterologist to clarify.  If you requested that your care partner not be given the details of your procedure findings, then the procedure report has been included in a sealed envelope for you to review at your convenience later.  YOU SHOULD EXPECT: Some feelings of bloating in the abdomen. Passage of more gas than usual.  Walking can help get rid of the air that was put into your GI tract during the procedure and reduce the bloating. If you had a lower endoscopy (such as a colonoscopy or flexible sigmoidoscopy) you may notice spotting of blood in your stool or on the toilet paper. If you underwent a bowel prep for your procedure, then you may not have a normal bowel movement for a few days.  DIET: Your first meal following the procedure should be a light meal and then it is ok to progress to your normal diet.  A half-sandwich or bowl of soup is an example of a good first meal.  Heavy or fried foods are harder to digest and may make you feel nauseous or bloated.  Likewise meals heavy in dairy and vegetables can cause extra gas to form and this can also increase the bloating.  Drink plenty of fluids but you should avoid alcoholic beverages for 24 hours.  ACTIVITY: Your care partner should take you home directly after the procedure.  You should plan to take it easy, moving slowly for the rest of the day.  You can resume normal activity the day after the procedure however you should NOT DRIVE or use heavy machinery for 24 hours (because of the sedation medicines used during the test).    SYMPTOMS TO REPORT IMMEDIATELY: A  gastroenterologist can be reached at any hour.  During normal business hours, 8:30 AM to 5:00 PM Monday through Friday, call [phone].  After hours and on weekends, please call the GI answering service at [phone] who will take a message and have the physician on call contact you.   Following lower endoscopy (colonoscopy or flexible sigmoidoscopy):  Excessive amounts of blood in the stool  Significant tenderness or worsening of abdominal pains  Swelling of the abdomen that is new, acute  Fever of 100F or higher  FOLLOW UP: If any biopsies were taken you will be contacted by phone or by letter within the next 1-3 weeks.  Call your gastroenterologist if you have not heard about the biopsies in 3 weeks.  Our staff will call the home number listed on your records the next business day following your procedure to check on you and address any questions or concerns that you may have at that time regarding the information given to you following your procedure. This is a courtesy call and so if there is no answer at the home number and we have not heard from you through the emergency physician on call, we will assume that you have returned to your regular daily activities without incident.  SIGNATURES/CONFIDENTIALITY: You and/or your care partner have signed paperwork which will be entered into your electronic medical record.  These signatures attest to the fact that that the information above on your After Visit Summary has been   reviewed and is understood.  Full responsibility of the confidentiality of this discharge information lies with you and/or your care-partner. 

## 2014-03-31 NOTE — Progress Notes (Signed)
Called to room to assist during endoscopic procedure.  Patient ID and intended procedure confirmed with present staff. Received instructions for my participation in the procedure from the performing physician.  

## 2014-03-31 NOTE — Op Note (Signed)
[place]  [address]. [place], [id number]   COLONOSCOPY PROCEDURE REPORT  PATIENT: Thomas Todd, Thomas Todd  MR#: [id number] BIRTHDATE: Nov 03, 1950 , 62  yrs. old GENDER: Male ENDOSCOPIST: Milus Banister, MD REFERRED WU:[id number] Duncan, M.D. PROCEDURE DATE:  03/31/2014 PROCEDURE:   Colonoscopy with snare polypectomy First Screening Colonoscopy - Avg.  risk and is 50 yrs.  old or older - No.  Prior Negative Screening - Now for repeat screening. N/A  History of Adenoma - Now for follow-up colonoscopy & has been > or = to 3 yrs.  N/A  Polyps Removed Today? Yes. ASA CLASS:   Class II INDICATIONS:FOBT positive stool. MEDICATIONS: MAC sedation, administered by CRNA and Propofol (Diprivan) 250 mg IV  DESCRIPTION OF PROCEDURE:   After the risks benefits and alternatives of the procedure were thoroughly explained, informed consent was obtained.  A digital rectal exam revealed no abnormalities of the rectum.   The LB [id number]  endoscope was introduced through the anus and advanced to the cecum, which was identified by both the appendix and ileocecal valve. No adverse events experienced.   The quality of the prep was excellent.  The instrument was then slowly withdrawn as the colon was fully examined.  COLON FINDINGS: Seven polyps were found, removed and sent to pathology.  These were all sessile, ranged in size from 83mm to 80mm, located in cecum, transverse, descending and rectum.  All were removed with cold snare path jar 1).  The examination was otherwise normal.  Retroflexed views revealed no abnormalities. The time to cecum=2 minutes 53 seconds.  Withdrawal time=11 minutes 32 seconds. The scope was withdrawn and the procedure completed. COMPLICATIONS: There were no complications.  ENDOSCOPIC IMPRESSION: Seven polyps were found, removed and sent to pathology. The examination was otherwise normal.  RECOMMENDATIONS: If the polyp(s) removed today are proven to be  adenomatous (pre-cancerous) polyps, you will need a colonoscopy in 3 years. Otherwise you should continue to follow colorectal cancer screening guidelines for "routine risk" patients with a colonoscopy in 10 years.  You will receive a letter within 1-2 weeks with the results of your biopsy as well as final recommendations.  Please call my office if you have not received a letter after 3 weeks.   eSigned:  Milus Banister, MD 03/31/2014 10:19 AM

## 2014-04-01 ENCOUNTER — Telehealth: Payer: Self-pay | Admitting: *Deleted

## 2014-04-01 NOTE — Telephone Encounter (Signed)
  Follow up Call-  Call back number 03/31/2014  Post procedure Call Back phone  # [phone]  Permission to leave phone message Yes     Patient questions:  Do you have a fever, pain , or abdominal swelling? No. Pain Score  0 *  Have you tolerated food without any problems? Yes.    Have you been able to return to your normal activities? Yes.    Do you have any questions about your discharge instructions: Diet   No. Medications  No. Follow up visit  No.  Do you have questions or concerns about your Care? No.  Actions: * If pain score is 4 or above: No action needed, pain <4.

## 2014-04-07 ENCOUNTER — Encounter: Payer: BC Managed Care – PPO | Admitting: Internal Medicine

## 2014-04-13 ENCOUNTER — Encounter: Payer: Self-pay | Admitting: Gastroenterology

## 2014-07-03 ENCOUNTER — Ambulatory Visit (INDEPENDENT_AMBULATORY_CARE_PROVIDER_SITE_OTHER): Payer: BC Managed Care – PPO

## 2014-07-03 DIAGNOSIS — Z23 Encounter for immunization: Secondary | ICD-10-CM

## 2014-08-06 ENCOUNTER — Encounter: Payer: Self-pay | Admitting: Family Medicine

## 2014-08-06 ENCOUNTER — Ambulatory Visit (INDEPENDENT_AMBULATORY_CARE_PROVIDER_SITE_OTHER): Payer: BC Managed Care – PPO | Admitting: Family Medicine

## 2014-08-06 VITALS — BP 128/76 | HR 70 | Temp 97.9°F | Ht 70.5 in | Wt 190.2 lb

## 2014-08-06 DIAGNOSIS — R739 Hyperglycemia, unspecified: Secondary | ICD-10-CM

## 2014-08-06 DIAGNOSIS — I10 Essential (primary) hypertension: Secondary | ICD-10-CM

## 2014-08-06 DIAGNOSIS — Z7189 Other specified counseling: Secondary | ICD-10-CM

## 2014-08-06 DIAGNOSIS — F528 Other sexual dysfunction not due to a substance or known physiological condition: Secondary | ICD-10-CM

## 2014-08-06 DIAGNOSIS — E785 Hyperlipidemia, unspecified: Secondary | ICD-10-CM

## 2014-08-06 DIAGNOSIS — N400 Enlarged prostate without lower urinary tract symptoms: Secondary | ICD-10-CM

## 2014-08-06 DIAGNOSIS — Z0000 Encounter for general adult medical examination without abnormal findings: Secondary | ICD-10-CM

## 2014-08-06 LAB — COMPREHENSIVE METABOLIC PANEL
ALT: 24 U/L (ref 0–53)
AST: 17 U/L (ref 0–37)
Albumin: 4.4 g/dL (ref 3.5–5.2)
Alkaline Phosphatase: 37 U/L — ABNORMAL LOW (ref 39–117)
BUN: 10 mg/dL (ref 6–23)
CO2: 28 mEq/L (ref 19–32)
Calcium: 9.8 mg/dL (ref 8.4–10.5)
Chloride: 103 mEq/L (ref 96–112)
Creatinine, Ser: 0.9 mg/dL (ref 0.4–1.5)
GFR: 89.97 mL/min (ref >60.00)
Glucose, Bld: 117 mg/dL — ABNORMAL HIGH (ref 70–99)
Potassium: 4.2 mEq/L (ref 3.5–5.1)
Sodium: 137 mEq/L (ref 135–145)
Total Bilirubin: 1 mg/dL (ref 0.2–1.2)
Total Protein: 6.7 g/dL (ref 6.0–8.3)

## 2014-08-06 LAB — LIPID PANEL
Cholesterol: 211 mg/dL — ABNORMAL HIGH (ref 0–200)
HDL: 30.2 mg/dL — ABNORMAL LOW (ref >39.00)
NonHDL: 180.8
Total CHOL/HDL Ratio: 7
Triglycerides: 298 mg/dL — ABNORMAL HIGH (ref 0.0–149.0)
VLDL: 59.6 mg/dL — ABNORMAL HIGH (ref 0.0–40.0)

## 2014-08-06 LAB — HEMOGLOBIN A1C: Hgb A1c MFr Bld: 6.6 % — ABNORMAL HIGH (ref 4.6–6.5)

## 2014-08-06 LAB — LDL CHOLESTEROL, DIRECT: Direct LDL: 104.6 mg/dL

## 2014-08-06 MED ORDER — ATORVASTATIN CALCIUM 20 MG PO TABS: 10.0000 mg | ORAL_TABLET | Freq: Every day | ORAL | Status: DC

## 2014-08-06 MED ORDER — LISINOPRIL-HYDROCHLOROTHIAZIDE 10-12.5 MG PO TABS: 1.0000 | ORAL_TABLET | Freq: Every day | ORAL | Status: DC

## 2014-08-06 MED ORDER — POTASSIUM CHLORIDE ER 10 MEQ PO CPCR: 10.0000 meq | ORAL_CAPSULE | Freq: Two times a day (BID) | ORAL | Status: DC

## 2014-08-06 NOTE — Progress Notes (Signed)
Pre visit review using our clinic review tool, if applicable. No additional management support is needed unless otherwise documented below in the visit note.  CPE- See plan.  Routine anticipatory guidance given to patient.  See health maintenance. Tetanus 2013 Flu 2015 Shingles d/w pt.  PNA at 67.  Colonoscopy 2015 Prostate cancer screening and PSA options (with potential risks and benefits of testing vs not testing) were discussed along with recent recs/guidelines.   He declined testing PSA at this point. Living will d/w pt.  Wife designated if patient were incapacitated.    Hyperglycemia.  He cut back on sweets in the meantime.  Due for f/u labs.  D/w pt.   Hypertension:    Using medication without problems or lightheadedness: yes Chest pain with exertion:no Edema:no Short of breath: no  Elevated Cholesterol: Using medications without problems:yes Muscle aches: rare, improved on lower dose of statin.  Diet compliance: yes Exercise: encouraged more.   Nocturia.  Gradual change.  Some dec in stream velocity, dec in force of stream.  No burning with urination.  Recent wnl and stable last year.  D/w pt.   PMH and SH reviewed  Meds, vitals, and allergies reviewed.   ROS: See HPI.  Otherwise negative.    GEN: nad, alert and oriented HEENT: mucous membranes moist NECK: supple w/o LA CV: rrr. PULM: ctab, no inc wob ABD: soft, +bs EXT: no edema SKIN: no acute rash Prostate gland firm and smooth, no enlargement, nodularity, tenderness, mass, asymmetry or induration.

## 2014-08-06 NOTE — Patient Instructions (Addendum)
Check with your insurance to see if they will cover the shingles shot. Go to the lab on the way out.  We'll contact you with your lab report. If you have more urinary symptoms and want to start flomax, then notify me.  Take care. Glad to see you.

## 2014-08-07 ENCOUNTER — Telehealth: Payer: Self-pay | Admitting: Family Medicine

## 2014-08-07 DIAGNOSIS — Z7189 Other specified counseling: Secondary | ICD-10-CM | POA: Insufficient documentation

## 2014-08-07 NOTE — Assessment & Plan Note (Signed)
Controlled, continue as is.  

## 2014-08-07 NOTE — Assessment & Plan Note (Signed)
Routine anticipatory guidance given to patient.  See health maintenance. Tetanus 2013 Flu 2015 Shingles d/w pt.  PNA at 67.  Colonoscopy 2015 Prostate cancer screening and PSA options (with potential risks and benefits of testing vs not testing) were discussed along with recent recs/guidelines.   He declined testing PSA at this point. Living will d/w pt.  Wife designated if patient were incapacitated.

## 2014-08-07 NOTE — Assessment & Plan Note (Signed)
Tolerating med, continue as is for now, see notes on labs. D/w pt about weight mgmt.

## 2014-08-07 NOTE — Assessment & Plan Note (Signed)
D/w pt about diet and sugar in general, see notes on labs.

## 2014-08-07 NOTE — Telephone Encounter (Signed)
emmi emailed °

## 2014-08-07 NOTE — Assessment & Plan Note (Signed)
Continue as is.   

## 2014-08-07 NOTE — Assessment & Plan Note (Signed)
Reassuring rectal exam, no nodularity.  D/w pt.  Prev PSA wnl Not bothersome to the point of starting meds.  If worsening, then he'll notify me.

## 2014-08-10 ENCOUNTER — Other Ambulatory Visit: Payer: Self-pay | Admitting: Family Medicine

## 2014-08-10 ENCOUNTER — Encounter: Payer: Self-pay | Admitting: *Deleted

## 2014-08-10 DIAGNOSIS — R739 Hyperglycemia, unspecified: Secondary | ICD-10-CM

## 2014-09-03 ENCOUNTER — Ambulatory Visit (INDEPENDENT_AMBULATORY_CARE_PROVIDER_SITE_OTHER): Payer: BC Managed Care – PPO | Admitting: *Deleted

## 2014-09-03 DIAGNOSIS — Z23 Encounter for immunization: Secondary | ICD-10-CM

## 2014-12-27 LAB — HM DIABETES EYE EXAM

## 2015-01-18 ENCOUNTER — Other Ambulatory Visit (INDEPENDENT_AMBULATORY_CARE_PROVIDER_SITE_OTHER): Payer: BLUE CROSS/BLUE SHIELD

## 2015-01-18 DIAGNOSIS — R739 Hyperglycemia, unspecified: Secondary | ICD-10-CM | POA: Diagnosis not present

## 2015-01-18 LAB — GLUCOSE, RANDOM: Glucose, Bld: 142 mg/dL — ABNORMAL HIGH (ref 70–99)

## 2015-01-18 LAB — HEMOGLOBIN A1C: Hgb A1c MFr Bld: 7.5 % — ABNORMAL HIGH (ref 4.6–6.5)

## 2015-01-26 ENCOUNTER — Encounter: Payer: Self-pay | Admitting: Family Medicine

## 2015-01-26 ENCOUNTER — Ambulatory Visit (INDEPENDENT_AMBULATORY_CARE_PROVIDER_SITE_OTHER): Payer: BLUE CROSS/BLUE SHIELD | Admitting: Family Medicine

## 2015-01-26 VITALS — BP 130/76 | HR 77 | Temp 98.3°F | Wt 192.8 lb

## 2015-01-26 DIAGNOSIS — E1165 Type 2 diabetes mellitus with hyperglycemia: Secondary | ICD-10-CM

## 2015-01-26 DIAGNOSIS — IMO0002 Reserved for concepts with insufficient information to code with codable children: Secondary | ICD-10-CM

## 2015-01-26 DIAGNOSIS — E785 Hyperlipidemia, unspecified: Secondary | ICD-10-CM | POA: Diagnosis not present

## 2015-01-26 DIAGNOSIS — E119 Type 2 diabetes mellitus without complications: Secondary | ICD-10-CM

## 2015-01-26 DIAGNOSIS — Z23 Encounter for immunization: Secondary | ICD-10-CM

## 2015-01-26 NOTE — Progress Notes (Signed)
Pre visit review using our clinic review tool, if applicable. No additional management support is needed unless otherwise documented below in the visit note.  Diabetes:  No meds Hypoglycemic episodes: no sx usually, only if prolonged fasting Hyperglycemic episodes: no sx Feet problems: no tingling, no loss in sensation, see below.  Blood Sugars averaging: not checked eye exam within last year:  Yes, 1 month ago.  A1c up, d/w pt.    Still with some foot cramps, unclear if related to statin.  Will stop for 2 weeks and then update me.  The leg cramps are better on lower dose of atorvastatin.  Meds, vitals, and allergies reviewed.   ROS: See HPI.  Otherwise negative.    GEN: nad, alert and oriented HEENT: mucous membranes moist NECK: supple w/o LA CV: rrr. PULM: ctab, no inc wob ABD: soft, +bs EXT: no edema SKIN: no acute rash  Diabetic foot exam: Normal inspection No skin breakdown No calluses  Normal DP pulses Normal sensation to light touch and monofilament Nails normal

## 2015-01-26 NOTE — Patient Instructions (Addendum)
Stop the atorvastatin for 2 weeks and see if the foot cramps are better.  Either way, then let me know.  Work on cutting out on the ice cream, recheck in about 3 months, labs ahead of time Keep walking on the treadmill.  Take care.  Glad to see you.

## 2015-01-27 DIAGNOSIS — E119 Type 2 diabetes mellitus without complications: Secondary | ICD-10-CM | POA: Insufficient documentation

## 2015-01-27 NOTE — Assessment & Plan Note (Signed)
He'll stop the atorvastatin for 2 weeks and see if the foot cramps are better. Either way, he'll let me know.  He agrees.

## 2015-01-27 NOTE — Assessment & Plan Note (Signed)
He needs diet and exercise, no meds at this point.  D/w pt about diet, ie cutting out on the ice cream, recheck in about 3 months, labs ahead of time.  Keep walking on the treadmill.

## 2015-02-09 ENCOUNTER — Telehealth: Payer: Self-pay

## 2015-02-09 DIAGNOSIS — E119 Type 2 diabetes mellitus without complications: Secondary | ICD-10-CM

## 2015-02-09 NOTE — Telephone Encounter (Signed)
Pt left v/m; pt was seen 01/26/15 and has been off cholesterol med; pt is still having cramping in feet but not quite as bad as when seen. Pt wants to know if should restart cholesterol med.Please advise.

## 2015-02-10 MED ORDER — PRAVASTATIN SODIUM 20 MG PO TABS: 20.0000 mg | ORAL_TABLET | Freq: Every day | ORAL | Status: DC

## 2015-02-10 NOTE — Telephone Encounter (Signed)
Yes, sent, thanks.

## 2015-02-10 NOTE — Telephone Encounter (Signed)
I would try changing to pravastatin 20mg  a day.  Less likely to cause the aches.  See if he is willing to try; if so, please send rx.  Thanks.

## 2015-02-10 NOTE — Telephone Encounter (Signed)
Patient notified as instructed by telephone and verbalized understanding. Patient stated that he is willing to try Pravastatin 20 mg a day. Patient stated that he has a follow-up lab appointment and office visit in August. Patient stated that he will call back if he has any problems with the change. [employer].  See allergy/contrainidcation. Is it okay to fill.

## 2015-04-23 ENCOUNTER — Other Ambulatory Visit: Payer: Self-pay | Admitting: Family Medicine

## 2015-04-23 DIAGNOSIS — I10 Essential (primary) hypertension: Secondary | ICD-10-CM

## 2015-04-23 DIAGNOSIS — D518 Other vitamin B12 deficiency anemias: Secondary | ICD-10-CM

## 2015-04-27 ENCOUNTER — Other Ambulatory Visit (INDEPENDENT_AMBULATORY_CARE_PROVIDER_SITE_OTHER): Payer: BLUE CROSS/BLUE SHIELD

## 2015-04-27 DIAGNOSIS — R7989 Other specified abnormal findings of blood chemistry: Secondary | ICD-10-CM

## 2015-04-27 DIAGNOSIS — E119 Type 2 diabetes mellitus without complications: Secondary | ICD-10-CM

## 2015-04-27 DIAGNOSIS — I10 Essential (primary) hypertension: Secondary | ICD-10-CM

## 2015-04-27 DIAGNOSIS — D518 Other vitamin B12 deficiency anemias: Secondary | ICD-10-CM

## 2015-04-27 LAB — VITAMIN B12: Vitamin B-12: 461 pg/mL (ref 211–911)

## 2015-04-27 LAB — COMPREHENSIVE METABOLIC PANEL
ALT: 20 U/L (ref 0–53)
AST: 14 U/L (ref 0–37)
Albumin: 4.3 g/dL (ref 3.5–5.2)
Alkaline Phosphatase: 31 U/L — ABNORMAL LOW (ref 39–117)
BUN: 17 mg/dL (ref 6–23)
CO2: 30 mEq/L (ref 19–32)
Calcium: 9.7 mg/dL (ref 8.4–10.5)
Chloride: 102 mEq/L (ref 96–112)
Creatinine, Ser: 0.92 mg/dL (ref 0.40–1.50)
GFR: 88.63 mL/min (ref >60.00)
Glucose, Bld: 136 mg/dL — ABNORMAL HIGH (ref 70–99)
Potassium: 3.9 mEq/L (ref 3.5–5.1)
Sodium: 138 mEq/L (ref 135–145)
Total Bilirubin: 0.8 mg/dL (ref 0.2–1.2)
Total Protein: 6.6 g/dL (ref 6.0–8.3)

## 2015-04-27 LAB — LDL CHOLESTEROL, DIRECT: Direct LDL: 119 mg/dL

## 2015-04-27 LAB — LIPID PANEL
Cholesterol: 234 mg/dL — ABNORMAL HIGH (ref 0–200)
HDL: 29.9 mg/dL — ABNORMAL LOW (ref >39.00)
NonHDL: 203.62
Total CHOL/HDL Ratio: 8
Triglycerides: 358 mg/dL — ABNORMAL HIGH (ref 0.0–149.0)
VLDL: 71.6 mg/dL — ABNORMAL HIGH (ref 0.0–40.0)

## 2015-04-27 LAB — HEMOGLOBIN A1C: Hgb A1c MFr Bld: 6.7 % — ABNORMAL HIGH (ref 4.6–6.5)

## 2015-04-30 ENCOUNTER — Ambulatory Visit (INDEPENDENT_AMBULATORY_CARE_PROVIDER_SITE_OTHER): Payer: BLUE CROSS/BLUE SHIELD | Admitting: Family Medicine

## 2015-04-30 ENCOUNTER — Encounter: Payer: Self-pay | Admitting: Family Medicine

## 2015-04-30 VITALS — BP 124/70 | HR 71 | Temp 98.5°F | Wt 192.2 lb

## 2015-04-30 DIAGNOSIS — E119 Type 2 diabetes mellitus without complications: Secondary | ICD-10-CM

## 2015-04-30 DIAGNOSIS — E785 Hyperlipidemia, unspecified: Secondary | ICD-10-CM

## 2015-04-30 NOTE — Progress Notes (Signed)
Pre visit review using our clinic review tool, if applicable. No additional management support is needed unless otherwise documented below in the visit note.  Diabetes:  No meds.  Hypoglycemic episodes: rare, if prolonged fasting Hyperglycemic episodes:no Feet problems: no Blood Sugars averaging:not checked.  eye exam within last year: yes A1c much improved with diet and exercise.  I thanked him for his effort.    Cramps better on pravastatin, much better than lipitor.  Tolerable per patient.    PMH and SH reviewed  Meds, vitals, and allergies reviewed.   ROS: See HPI.  Otherwise negative.    GEN: nad, alert and oriented HEENT: mucous membranes moist NECK: supple w/o LA CV: rrr. PULM: ctab, no inc wob

## 2015-04-30 NOTE — Patient Instructions (Signed)
Take care.  Glad to see you.  Recheck in about 6 months at a physical.  Labs ahead of time.  Thanks for your effort.

## 2015-05-03 NOTE — Assessment & Plan Note (Signed)
A1c much improved with diet and exercise. I thanked him for his effort.  Recheck in about 6 months.  Continue work on diet and exercise.

## 2015-05-03 NOTE — Assessment & Plan Note (Signed)
Cramps better on pravastatin, much better than lipitor. Tolerable per patient.  Continue as is.

## 2015-06-28 ENCOUNTER — Ambulatory Visit (INDEPENDENT_AMBULATORY_CARE_PROVIDER_SITE_OTHER): Payer: BLUE CROSS/BLUE SHIELD

## 2015-06-28 DIAGNOSIS — Z23 Encounter for immunization: Secondary | ICD-10-CM | POA: Diagnosis not present

## 2015-08-02 ENCOUNTER — Other Ambulatory Visit: Payer: Self-pay | Admitting: *Deleted

## 2015-08-02 MED ORDER — PRAVASTATIN SODIUM 20 MG PO TABS: 20.0000 mg | ORAL_TABLET | Freq: Every day | ORAL | Status: DC

## 2015-08-23 ENCOUNTER — Other Ambulatory Visit: Payer: Self-pay | Admitting: *Deleted

## 2015-08-23 MED ORDER — LISINOPRIL-HYDROCHLOROTHIAZIDE 10-12.5 MG PO TABS: 1.0000 | ORAL_TABLET | Freq: Every day | ORAL | Status: DC

## 2015-08-23 MED ORDER — POTASSIUM CHLORIDE ER 10 MEQ PO CPCR: 10.0000 meq | ORAL_CAPSULE | Freq: Two times a day (BID) | ORAL | Status: DC

## 2015-10-22 ENCOUNTER — Other Ambulatory Visit: Payer: Self-pay | Admitting: Family Medicine

## 2015-10-22 DIAGNOSIS — E119 Type 2 diabetes mellitus without complications: Secondary | ICD-10-CM

## 2015-10-26 ENCOUNTER — Other Ambulatory Visit (INDEPENDENT_AMBULATORY_CARE_PROVIDER_SITE_OTHER): Payer: BLUE CROSS/BLUE SHIELD

## 2015-10-26 ENCOUNTER — Other Ambulatory Visit: Payer: BLUE CROSS/BLUE SHIELD

## 2015-10-26 DIAGNOSIS — E119 Type 2 diabetes mellitus without complications: Secondary | ICD-10-CM

## 2015-10-26 LAB — LIPID PANEL
Cholesterol: 248 mg/dL — ABNORMAL HIGH (ref 0–200)
HDL: 31.6 mg/dL — ABNORMAL LOW (ref >39.00)
Total CHOL/HDL Ratio: 8
Triglycerides: 435 mg/dL — ABNORMAL HIGH (ref 0.0–149.0)

## 2015-10-26 LAB — COMPREHENSIVE METABOLIC PANEL
ALT: 26 U/L (ref 0–53)
AST: 13 U/L (ref 0–37)
Albumin: 4.4 g/dL (ref 3.5–5.2)
Alkaline Phosphatase: 34 U/L — ABNORMAL LOW (ref 39–117)
BUN: 17 mg/dL (ref 6–23)
CO2: 29 mEq/L (ref 19–32)
Calcium: 9.7 mg/dL (ref 8.4–10.5)
Chloride: 101 mEq/L (ref 96–112)
Creatinine, Ser: 0.97 mg/dL (ref 0.40–1.50)
GFR: 83.24 mL/min (ref >60.00)
Glucose, Bld: 149 mg/dL — ABNORMAL HIGH (ref 70–99)
Potassium: 4.1 mEq/L (ref 3.5–5.1)
Sodium: 137 mEq/L (ref 135–145)
Total Bilirubin: 0.6 mg/dL (ref 0.2–1.2)
Total Protein: 6.8 g/dL (ref 6.0–8.3)

## 2015-10-26 LAB — LDL CHOLESTEROL, DIRECT: Direct LDL: 125 mg/dL

## 2015-10-26 LAB — HEMOGLOBIN A1C: Hgb A1c MFr Bld: 7.1 % — ABNORMAL HIGH (ref 4.6–6.5)

## 2015-11-02 ENCOUNTER — Encounter: Payer: Self-pay | Admitting: Family Medicine

## 2015-11-02 ENCOUNTER — Ambulatory Visit (INDEPENDENT_AMBULATORY_CARE_PROVIDER_SITE_OTHER): Payer: BLUE CROSS/BLUE SHIELD | Admitting: Family Medicine

## 2015-11-02 VITALS — BP 142/80 | HR 72 | Temp 98.6°F | Wt 190.5 lb

## 2015-11-02 DIAGNOSIS — I10 Essential (primary) hypertension: Secondary | ICD-10-CM

## 2015-11-02 DIAGNOSIS — E785 Hyperlipidemia, unspecified: Secondary | ICD-10-CM

## 2015-11-02 DIAGNOSIS — E119 Type 2 diabetes mellitus without complications: Secondary | ICD-10-CM

## 2015-11-02 DIAGNOSIS — Z0000 Encounter for general adult medical examination without abnormal findings: Secondary | ICD-10-CM | POA: Diagnosis not present

## 2015-11-02 DIAGNOSIS — Z119 Encounter for screening for infectious and parasitic diseases, unspecified: Secondary | ICD-10-CM

## 2015-11-02 MED ORDER — LISINOPRIL-HYDROCHLOROTHIAZIDE 10-12.5 MG PO TABS: 1.0000 | ORAL_TABLET | Freq: Every day | ORAL | Status: DC

## 2015-11-02 MED ORDER — PRAVASTATIN SODIUM 20 MG PO TABS: 20.0000 mg | ORAL_TABLET | Freq: Every day | ORAL | Status: DC

## 2015-11-02 MED ORDER — POTASSIUM CHLORIDE ER 10 MEQ PO CPCR: 10.0000 meq | ORAL_CAPSULE | Freq: Two times a day (BID) | ORAL | Status: DC

## 2015-11-02 NOTE — Patient Instructions (Signed)
Use the eat right diet and recheck labs in about 6 months.  Keep walking for exercise.   Take care.  Glad to see you.

## 2015-11-02 NOTE — Progress Notes (Signed)
Pre visit review using our clinic review tool, if applicable. No additional management support is needed unless otherwise documented below in the visit note.  CPE- See plan.  Routine anticipatory guidance given to patient.  See health maintenance. Tetanus 2013 Flu up to date Shingles prev done PNA at 35 Colonoscopy 2015 Prostate cancer screening and PSA options (with potential risks and benefits of testing vs not testing) were discussed along with recent recs/guidelines He declined testing PSA at this point Living will d/w pt. Wife designated if patient were incapacitated HCV and HIV screening d/w pt.   Diet and exercise d/w pt.  Doing work on treadmill 78min a day, 5 days a week.    1 daughter, chronically ill, d/w pt.  This is a chronic stressor for him.  D/w pt.  I didn't give confidential information about her.    Elevated Cholesterol: Using medications without problems: yes Muscle aches: no Diet compliance:yes Exercise:yes Labs d/w pt.  Less aches on pravastatin, compared to lipitor.   Hypertension:    Using medication without problems or lightheadedness: yes Chest pain with exertion:no Edema:no Short of breath:no  Diabetes:  Using medications without difficulties: no meds Hypoglycemic episodes: no sx Hyperglycemic episodes:no sx Feet problems:no Blood Sugars averaging: not checked.   Eye exam within last year: due this year.    PMH and SH reviewed  Meds, vitals, and allergies reviewed.   ROS: See HPI.  Otherwise negative.    GEN: nad, alert and oriented HEENT: mucous membranes moist NECK: supple w/o LA CV: rrr. PULM: ctab, no inc wob ABD: soft, +bs EXT: no edema SKIN: no acute rash  Diabetic foot exam: Normal inspection No skin breakdown No calluses  Normal DP pulses Normal sensation to light touch and monofilament Nails normal

## 2015-11-03 NOTE — Assessment & Plan Note (Signed)
Needs D&E, not a new med.  D/w pt.  Recheck later in 2017, eat right diet given to patient.  He agrees.  Labs d/w pt.

## 2015-11-03 NOTE — Assessment & Plan Note (Signed)
Tetanus 2013 Flu up to date Shingles prev done PNA at 82 Colonoscopy 2015 Prostate cancer screening and PSA options (with potential risks and benefits of testing vs not testing) were discussed along with recent recs/guidelines He declined testing PSA at this point Living will d/w pt. Wife designated if patient were incapacitated HCV and HIV screening d/w pt.   Diet and exercise d/w pt.  Doing work on treadmill 73min a day, 5 days a week.

## 2015-11-03 NOTE — Assessment & Plan Note (Signed)
Continue current statin, needs DM2 control with D&E for TG.  D/w pt.

## 2015-11-03 NOTE — Assessment & Plan Note (Signed)
Nearly controlled, needs continued work on diet, continue exercise.  Would likely get to goal w/o med change with weight loss.  He agrees.

## 2016-01-07 LAB — HM DIABETES EYE EXAM

## 2016-04-25 ENCOUNTER — Other Ambulatory Visit: Payer: Self-pay | Admitting: Family Medicine

## 2016-04-25 ENCOUNTER — Other Ambulatory Visit (INDEPENDENT_AMBULATORY_CARE_PROVIDER_SITE_OTHER): Payer: BLUE CROSS/BLUE SHIELD

## 2016-04-25 DIAGNOSIS — E119 Type 2 diabetes mellitus without complications: Secondary | ICD-10-CM

## 2016-04-25 DIAGNOSIS — Z119 Encounter for screening for infectious and parasitic diseases, unspecified: Secondary | ICD-10-CM | POA: Diagnosis not present

## 2016-04-25 LAB — HEMOGLOBIN A1C: Hgb A1c MFr Bld: 6.8 % — ABNORMAL HIGH (ref 4.6–6.5)

## 2016-04-26 LAB — HIV ANTIBODY (ROUTINE TESTING W REFLEX): HIV 1&2 Ab, 4th Generation: NONREACTIVE

## 2016-04-26 LAB — HEPATITIS C ANTIBODY: HCV Ab: NEGATIVE

## 2016-05-01 ENCOUNTER — Encounter: Payer: Self-pay | Admitting: Family Medicine

## 2016-05-01 ENCOUNTER — Ambulatory Visit (INDEPENDENT_AMBULATORY_CARE_PROVIDER_SITE_OTHER): Payer: BLUE CROSS/BLUE SHIELD | Admitting: Family Medicine

## 2016-05-01 DIAGNOSIS — E119 Type 2 diabetes mellitus without complications: Secondary | ICD-10-CM | POA: Diagnosis not present

## 2016-05-01 NOTE — Progress Notes (Signed)
Pre visit review using our clinic review tool, if applicable. No additional management support is needed unless otherwise documented below in the visit note. 

## 2016-05-01 NOTE — Patient Instructions (Addendum)
Recheck in about 6 months with labs before a physical.   Thanks for your effort.  Keep up the good work.  Take care.  Glad to see you.  Don't change your meds for now.

## 2016-05-01 NOTE — Assessment & Plan Note (Signed)
No change in meds.  Normal foot exam.  A1c at goal.  No meds for DM2.  D/w pt.  Recheck in about 6 months.

## 2016-05-01 NOTE — Progress Notes (Signed)
Diabetes:  No meds Hypoglycemic episodes:no Hyperglycemic episodes:no Feet problems:no tingling.   Blood Sugars averaging: not checked often.  eye exam within last year:  yes Labs d/w pt.  A1c at goal.  He cut back on sweets in the meantime, d/w pt.   He is walking more in the meantime.    He has sig noise exposure with some ear ringing.  He has noted some hearing loss with asking others to repeat themselves.  D/w pt.  Offered audiology appointment.  He'll consider.  D/w pt about hearing protection.    Per patient, his daughter is doing some better, d/w pt.  This is a relief for him.    Meds, vitals, and allergies reviewed.   ROS: Per HPI unless specifically indicated in ROS section   GEN: nad, alert and oriented HEENT: mucous membranes moist NECK: supple w/o LA CV: rrr. PULM: ctab, no inc wob ABD: soft, +bs EXT: no edema SKIN: no acute rash  Diabetic foot exam: Normal inspection No skin breakdown No calluses  Normal DP pulses Normal sensation to light touch and monofilament Nails normal

## 2016-06-15 ENCOUNTER — Ambulatory Visit (INDEPENDENT_AMBULATORY_CARE_PROVIDER_SITE_OTHER): Payer: BLUE CROSS/BLUE SHIELD

## 2016-06-15 DIAGNOSIS — Z23 Encounter for immunization: Secondary | ICD-10-CM

## 2016-07-03 ENCOUNTER — Telehealth: Payer: Self-pay

## 2016-07-03 NOTE — Telephone Encounter (Signed)
PLEASE NOTE: All timestamps contained within this report are represented as [place] Standard Time. CONFIDENTIALTY NOTICE: This fax transmission is intended only for the addressee. It contains information that is legally privileged, confidential or otherwise protected from use or disclosure. If you are not the intended recipient, you are strictly prohibited from reviewing, disclosing, copying using or disseminating any of this information or taking any action in reliance on or regarding this information. If you have received this fax in error, please notify us immediately by telephone so that we can arrange for its return to [place]. Phone: [phone], Toll-Free: [phone], Fax: [phone] Page: 1 of 2 Call Id: [id number] [place] Patient Name: Thomas Todd Gender: Male DOB: 1950/12/24 Age: 65 Y 1 M 1 D Return Phone Number: [phone] (Primary) Address: City/State/Zip: [place] Client [facility] - Client Client Site [place] Physician Renford Dills - MD Contact Type Call Who Is Calling Patient / Member / Family / Caregiver Call Type Triage / Clinical Caller Name Omni Hellmich Relationship To Patient Spouse Return Phone Number [phone] (Primary) Chief Complaint Insomnia Reason for Call Symptomatic / Request for Health Information Initial Comment CHART 2/2 Caller states she lost her daughter last night and she would like a medication to help sleep. Additional Comment [facility] [phone] [facility] . Translation No Nurse Assessment Nurse: Venetia Maxon, RN, Manuela Schwartz Date/Time Eilene Ghazi Time): 07/01/2016 6:12:29 PM Confirm and document reason for call. If symptomatic, describe symptoms. You must click the next button to save text entered. ---CHART 2/2 Caller states she lost her daughter last night and she would like a medication to  help sleep. The dtr died in her sleep at her home. ( she has had 13 operations) last one EGD Thurs . spouse works 2 jobs and when he came in he told her that he brought some food. She was in bed. She did not get up to eat . When he went to go to bed he asked her to move. and she was dead. He called 911. coroner has body for autopsy Has the patient traveled out of the country within the last 30 days? ---No Does the patient have any new or worsening symptoms? ---Yes Will a triage be completed? ---Yes Related visit to physician within the last 2 weeks? ---No Does the PT have any chronic conditions? (i.e. diabetes, asthma, etc.) ---Yes List chronic conditions. ---HTN Is this a behavioral health or substance abuse call? ---No Guidelines Guideline Title Affirmed Question Affirmed Notes Nurse Date/Time ([place] Time) Disp. Time Eilene Ghazi Time) Disposition Final User 07/01/2016 6:17:49 PM Send To RN Personal Venetia Maxon, RN, Liliane Bade NOTE: All timestamps contained within this report are represented as [place] Standard Time. CONFIDENTIALTY NOTICE: This fax transmission is intended only for the addressee. It contains information that is legally privileged, confidential or otherwise protected from use or disclosure. If you are not the intended recipient, you are strictly prohibited from reviewing, disclosing, copying using or disseminating any of this information or taking any action in reliance on or regarding this information. If you have received this fax in error, please notify us immediately by telephone so that we can arrange for its return to [place]. Phone: [phone], Toll-Free: [phone], Fax: [phone] Page: 2 of 2 Call Id: [id number] [place]. Time Eilene Ghazi Time) Disposition Final User 07/01/2016 6:18:49 PM Attempt made - message left Venetia Maxon, RN, Manuela Schwartz 07/01/2016 6:29:49 PM Call Completed Venetia Maxon,  RN, Manuela Schwartz 07/01/2016 6:17:34 PM Clinical Call Yes Venetia Maxon, RN, Manuela Schwartz Comments User:  Willey Blade, RN Date/Time Eilene Ghazi Time): 07/01/2016 6:28:36 PM TXT paged oncall Dr Deborra Medina for the spouse of this pt and she will review EMR and contact the couple . advised the caller of the above.

## 2016-07-03 NOTE — Telephone Encounter (Signed)
I called and offered my condolences.  He thanked me and will update me as needed.

## 2016-07-07 ENCOUNTER — Telehealth: Payer: Self-pay | Admitting: Family Medicine

## 2016-07-07 NOTE — Telephone Encounter (Signed)
Agreed, thanks.  Await input from patient.

## 2016-07-07 NOTE — Telephone Encounter (Signed)
Patient says he has had some rectal bleeding for the past few mornings, feels that it is probably hemorrhoids.  Patient is asking if it would be okay to use some Preparation H and was advised to try that over the weekend and if it wasn't better by Monday, make an appt to see Dr. Damita Dunnings.

## 2016-07-07 NOTE — Telephone Encounter (Signed)
Received message asking Dr. Josefine Class nurse to call the pt.  There was no explanation about the reason of the call.

## 2016-11-02 ENCOUNTER — Encounter: Payer: Self-pay | Admitting: Family Medicine

## 2016-11-02 ENCOUNTER — Other Ambulatory Visit: Payer: Self-pay | Admitting: Family Medicine

## 2016-11-02 ENCOUNTER — Other Ambulatory Visit (INDEPENDENT_AMBULATORY_CARE_PROVIDER_SITE_OTHER): Payer: BLUE CROSS/BLUE SHIELD

## 2016-11-02 DIAGNOSIS — E119 Type 2 diabetes mellitus without complications: Secondary | ICD-10-CM

## 2016-11-02 DIAGNOSIS — R7989 Other specified abnormal findings of blood chemistry: Secondary | ICD-10-CM

## 2016-11-02 DIAGNOSIS — D518 Other vitamin B12 deficiency anemias: Secondary | ICD-10-CM | POA: Diagnosis not present

## 2016-11-02 LAB — LDL CHOLESTEROL, DIRECT: Direct LDL: 150 mg/dL

## 2016-11-02 LAB — LIPID PANEL
Cholesterol: 240 mg/dL — ABNORMAL HIGH (ref 0–200)
HDL: 37.5 mg/dL — ABNORMAL LOW (ref >39.00)
NonHDL: 202.07
Total CHOL/HDL Ratio: 6
Triglycerides: 229 mg/dL — ABNORMAL HIGH (ref 0.0–149.0)
VLDL: 45.8 mg/dL — ABNORMAL HIGH (ref 0.0–40.0)

## 2016-11-02 LAB — VITAMIN B12: Vitamin B-12: 260 pg/mL (ref 211–911)

## 2016-11-02 LAB — COMPREHENSIVE METABOLIC PANEL
ALT: 30 U/L (ref 0–53)
AST: 15 U/L (ref 0–37)
Albumin: 4.4 g/dL (ref 3.5–5.2)
Alkaline Phosphatase: 36 U/L — ABNORMAL LOW (ref 39–117)
BUN: 17 mg/dL (ref 6–23)
CO2: 31 mEq/L (ref 19–32)
Calcium: 9.7 mg/dL (ref 8.4–10.5)
Chloride: 102 mEq/L (ref 96–112)
Creatinine, Ser: 0.88 mg/dL (ref 0.40–1.50)
GFR: 92.84 mL/min (ref >60.00)
Glucose, Bld: 124 mg/dL — ABNORMAL HIGH (ref 70–99)
Potassium: 4 mEq/L (ref 3.5–5.1)
Sodium: 138 mEq/L (ref 135–145)
Total Bilirubin: 0.7 mg/dL (ref 0.2–1.2)
Total Protein: 6.5 g/dL (ref 6.0–8.3)

## 2016-11-02 LAB — HEMOGLOBIN A1C: Hgb A1c MFr Bld: 6.9 % — ABNORMAL HIGH (ref 4.6–6.5)

## 2016-11-06 ENCOUNTER — Other Ambulatory Visit: Payer: Self-pay | Admitting: Family Medicine

## 2016-11-06 NOTE — Telephone Encounter (Signed)
pts wife called about the status of the medication refill.  Can you please contact her.

## 2016-11-07 ENCOUNTER — Ambulatory Visit (INDEPENDENT_AMBULATORY_CARE_PROVIDER_SITE_OTHER): Payer: BLUE CROSS/BLUE SHIELD | Admitting: Family Medicine

## 2016-11-07 ENCOUNTER — Encounter: Payer: Self-pay | Admitting: Family Medicine

## 2016-11-07 VITALS — BP 122/82 | HR 66 | Temp 98.6°F | Ht 71.0 in | Wt 180.5 lb

## 2016-11-07 DIAGNOSIS — I10 Essential (primary) hypertension: Secondary | ICD-10-CM | POA: Diagnosis not present

## 2016-11-07 DIAGNOSIS — Z0000 Encounter for general adult medical examination without abnormal findings: Secondary | ICD-10-CM

## 2016-11-07 DIAGNOSIS — E119 Type 2 diabetes mellitus without complications: Secondary | ICD-10-CM

## 2016-11-07 DIAGNOSIS — D518 Other vitamin B12 deficiency anemias: Secondary | ICD-10-CM

## 2016-11-07 DIAGNOSIS — R252 Cramp and spasm: Secondary | ICD-10-CM

## 2016-11-07 DIAGNOSIS — Z125 Encounter for screening for malignant neoplasm of prostate: Secondary | ICD-10-CM

## 2016-11-07 DIAGNOSIS — E785 Hyperlipidemia, unspecified: Secondary | ICD-10-CM

## 2016-11-07 MED ORDER — PRAVASTATIN SODIUM 20 MG PO TABS: 20.0000 mg | ORAL_TABLET | Freq: Every day | ORAL | 12 refills | Status: DC

## 2016-11-07 MED ORDER — POTASSIUM CHLORIDE ER 10 MEQ PO TBCR: 10.0000 meq | EXTENDED_RELEASE_TABLET | Freq: Two times a day (BID) | ORAL | 12 refills | Status: DC

## 2016-11-07 MED ORDER — VITAMIN B-12 1000 MCG PO TABS: 1000.0000 ug | ORAL_TABLET | Freq: Every day | ORAL | Status: DC

## 2016-11-07 MED ORDER — LISINOPRIL-HYDROCHLOROTHIAZIDE 10-12.5 MG PO TABS
1.0000 | ORAL_TABLET | Freq: Every day | ORAL | 12 refills | Status: DC
Start: 2016-11-07 — End: 2017-11-13

## 2016-11-07 NOTE — Progress Notes (Signed)
CPE- See plan.  Routine anticipatory guidance given to patient.  See health maintenance. Tetanus 2013 Flu up to date Shingles prev done PNA at 67 Colonoscopy 2015, due this year, d/w pt.   Prostate cancer screening and PSA options (with potential risks and benefits of testing vs not testing) were discussed along with recent recs/guidelines.  He declined testing PSA at this point.  He has minimal LUTS with some slowing of the stream.  D/w pt about checking PSA with next set of labs.  See orders.   Living will d/w pt. Wife designated if patient were incapacitated HCV and HIV screening prev done.    Diet and exercise d/w pt.  Encouraged both, given the recent upheaval he is doing the best he can.    His daughter died last year.  I offered my condolences.  At this point the autopsy is reportedly still pending.   He had a tooth pulled and will need an implant.   No SI/HI, he has been getting by.  D/w pt.  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    Diabetes:  No meds for DM2 Hypoglycemic episodes: no  Hyperglycemic episodes: no Feet problems: some occ cramping.   Blood Sugars averaging: rarely checked eye exam within last year:yes Labs d/w pt.   Hypertension:    Using medication without problems or lightheadedness: yes Chest pain with exertion:no Edema:no Short of breath:no  Elevated Cholesterol: Using medications without problems:yes Muscle aches: some foot cramping.  This is different from prev cramping with the lipitor.   Diet compliance: see above, he is trying.   Exercise: yes, see above, he is trying.   Labs d/w pt.    B12 level relatively low.  D/w pt.    PMH and SH reviewed.   Vital signs, Meds and allergies reviewed.  ROS: Per HPI unless specifically indicated in ROS section   GEN: nad, alert and oriented HEENT: mucous membranes moist NECK: supple w/o LA CV: rrr.  no murmur PULM: ctab, no inc wob ABD: soft, +bs EXT: no edema SKIN: no acute rash  Diabetic foot  exam: Normal inspection No skin breakdown No calluses  Normal DP pulses Normal sensation to light tough and monofilament Nails normal

## 2016-11-07 NOTE — Telephone Encounter (Signed)
Has OV today, can d/w pt at [facility].  Refill recent sent.  Thanks.

## 2016-11-07 NOTE — Patient Instructions (Signed)
Recheck in about 6 months, labs ahead of time.  Add on B12 in the meantime.  Take care.  Glad to see you.  Update me as needed.

## 2016-11-07 NOTE — Progress Notes (Signed)
Pre visit review using our clinic review tool, if applicable. No additional management support is needed unless otherwise documented below in the visit note. 

## 2016-11-08 NOTE — Assessment & Plan Note (Signed)
History of, B12 is relatively low, on the low end of normal. Unclear if this is affecting his cramping. Discussed with patient. Start oral replacement. Recheck later on. He agrees.

## 2016-11-08 NOTE — Assessment & Plan Note (Signed)
No medications. Continue as is. He'll work on diet and exercise the best he can. A1c still controlled.

## 2016-11-08 NOTE — Assessment & Plan Note (Signed)
Tetanus 2013 Flu up to date Shingles prev done PNA at 67 Colonoscopy 2015, due this year, d/w pt.   Prostate cancer screening and PSA options (with potential risks and benefits of testing vs not testing) were discussed along with recent recs/guidelines.  He declined testing PSA at this point.  He has minimal LUTS with some slowing of the stream.  D/w pt about checking PSA with next set of labs.  See orders.   Living will d/w pt. Wife designated if patient were incapacitated HCV and HIV screening prev done.    Diet and exercise d/w pt.  Encouraged both, given the recent upheaval he is doing the best he can.

## 2016-11-08 NOTE — Assessment & Plan Note (Signed)
Continue pravastatin.  Continue work on diet and exercise.  He agrees. 

## 2016-11-08 NOTE — Assessment & Plan Note (Signed)
Labs discussed with patient. Controlled. Continue as is. He agrees.

## 2017-02-09 ENCOUNTER — Encounter: Payer: Self-pay | Admitting: Gastroenterology

## 2017-02-14 ENCOUNTER — Ambulatory Visit (INDEPENDENT_AMBULATORY_CARE_PROVIDER_SITE_OTHER): Payer: BLUE CROSS/BLUE SHIELD | Admitting: Primary Care

## 2017-02-14 ENCOUNTER — Encounter: Payer: Self-pay | Admitting: Primary Care

## 2017-02-14 VITALS — BP 124/82 | HR 66 | Temp 98.4°F | Ht 71.0 in | Wt 187.8 lb

## 2017-02-14 DIAGNOSIS — J302 Other seasonal allergic rhinitis: Secondary | ICD-10-CM

## 2017-02-14 DIAGNOSIS — R21 Rash and other nonspecific skin eruption: Secondary | ICD-10-CM | POA: Diagnosis not present

## 2017-02-14 MED ORDER — FLUTICASONE PROPIONATE 50 MCG/ACT NA SUSP: 1.0000 | Freq: Two times a day (BID) | NASAL | 1 refills | Status: DC

## 2017-02-14 MED ORDER — LEVOCETIRIZINE DIHYDROCHLORIDE 5 MG PO TABS: ORAL_TABLET | ORAL | 0 refills | Status: DC

## 2017-02-14 MED ORDER — TRIAMCINOLONE ACETONIDE 0.1 % EX CREA: 1.0000 "application " | TOPICAL_CREAM | Freq: Two times a day (BID) | CUTANEOUS | 0 refills | Status: DC

## 2017-02-14 NOTE — Progress Notes (Signed)
Subjective:    Patient ID: Thomas Todd, male    DOB: 12/25/1950, 66 y.o.   MRN: [id number]  HPI  Thomas Todd is a 66 year old male with a history of tobacco abuse, GERD who presents today with multiple complaints.  1) Nasal Congestion: Ongoing for the past 3-4 weeks. He's been using Afrin nasal spray for the past three weeks and has noticed an increase in congestion over the past 1 week. He's also taken Allegra and Alka-Selzer without much improvement. He's blowing clear mucous from his nasal cavity. He denies fevers, headaches, dizziness, cough. He does have mild sinus pressure.  2) Rash: Raised bumps that have intermittently popped up to the right lower abdomen, lower extremity, buttocks, left foot for the past one month. His rash will last 2-3 days at a time and disappear. His bumps are itchy in nature. He's applied OTC cortisone and spraying OTC poison ivy treatment with some improvement. He went to the drug store, spoke with the pharmacist who thought it looked like eczema. He denies changes in soaps, food, detergents. He denies new unlaundered clothing.   Review of Systems  Constitutional: Negative for fever.  HENT: Positive for congestion, postnasal drip and sinus pressure. Negative for ear pain and sore throat.   Respiratory: Negative for cough and shortness of breath.   Skin: Positive for rash.       Past Medical History:  Diagnosis Date  . Arthritis   . Diabetes mellitus without complication ([place])   . GERD (gastroesophageal reflux disease)   . Hyperlipidemia   . Hypertension   . LBP (low back pain)    with sciatica   . Pancreatitis    Pancreatitis etoh 08/1998     Social History   Social History  . Marital status: Married    Spouse name: N/A  . Number of children: N/A  . Years of education: N/A   Occupational History  . Not on file.   Social History Main Topics  . Smoking status: Former Smoker    Quit date: 09/17/2009  . Smokeless tobacco: Never Used   Comment: vapor  . 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 Drug use: No  . Sexual activity: Not on file   Other Topics Concern  . Not on file   Social History Narrative   Sober since 1999   Prev did [employer], worked for [employer]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, retired as of 2015   Married 1975   Prev had 1 daughter, was chronically ill and died August 10, 2016    Past Surgical History:  Procedure Laterality Date  . LAMINECTOMY  1993   L5/S1  . SEPTOPLASTY  1970    Family History  Problem Relation Age of Onset  . Hypertension Mother   . Diabetes Mother   . Hyperlipidemia Mother   . Hypothyroidism Mother   . Dementia Mother   . Alcohol abuse Father   . Heart disease Father   . Diabetes Sister   . Colon cancer Neg Hx   . Prostate cancer Neg Hx     Allergies  Allergen Reactions  . Atorvastatin     Myalgias    Current Outpatient Prescriptions on File Prior to Visit  Medication Sig Dispense Refill  . aspirin EC 81 MG tablet Take 81 mg by mouth daily.    . cholecalciferol (VITAMIN D) 1000 UNITS tablet Take 1,000 Units by mouth daily.      Marland Kitchen lisinopril-hydrochlorothiazide (PRINZIDE,ZESTORETIC) 10-12.5  MG tablet Take 1 tablet by mouth daily. 30 tablet 12  . Omega-3 Fatty Acids (FISH OIL PO) Take 2,400 mg by mouth daily.     . potassium chloride (K-DUR) 10 MEQ tablet Take 1 tablet (10 mEq total) by mouth 2 (two) times daily. 60 tablet 12  . pravastatin (PRAVACHOL) 20 MG tablet Take 1 tablet (20 mg total) by mouth daily. 30 tablet 12  . vitamin B-12 (CYANOCOBALAMIN) 1000 MCG tablet Take 1 tablet (1,000 mcg total) by mouth daily.     No current facility-administered medications on file prior to visit.     BP 124/82   Pulse 66   Temp 98.4 F (36.9 C) (Oral)   Ht 5\' 11"  (1.803 m)   Wt 187 lb 12.8 oz (85.2 kg)   SpO2 96%   BMI 26.19 kg/m    Objective:   Physical Exam  Constitutional: He appears well-nourished. He does not appear ill.  HENT:  Right Ear: Tympanic membrane  and ear canal normal.  Left Ear: Tympanic membrane and ear canal normal.  Nose: Mucosal edema and rhinorrhea present. Right sinus exhibits no maxillary sinus tenderness and no frontal sinus tenderness. Left sinus exhibits no maxillary sinus tenderness and no frontal sinus tenderness.  Mouth/Throat: Oropharynx is clear and moist.  Eyes: Conjunctivae are normal.  Neck: Neck supple.  Cardiovascular: Normal rate and regular rhythm.   Pulmonary/Chest: Effort normal and breath sounds normal. He has no wheezes. He has no rales.  Skin: Skin is warm and dry. No rash noted. No erythema.          Assessment & Plan:  Nasal Congestion:  No evidence of sinus infection. Rebound congestion from chronic use of Afrin.  Discussed to stop Afrin immediately and explained risks for chronic use. Will have him start Flonase, Xyzal. Update if no improvement in 1 week.  Rash:  No rash today, but does have a picture of raised, mild erythema bumps to right lower abdomen that was taken later last week. No obvious cause for rash. Exam unremarkable today. Do not suspect shingles, insect cause. Rx for low-medium dose Triamcinolone cream sent to pharmacy. Start Xyzal daily. Consider adding in Pepcid if no improvement. He will update.  Sheral Flow, NP

## 2017-02-14 NOTE — Patient Instructions (Signed)
Stop Allegra. Start levocertirizine (Xyzal) tablets once every night at bedtime for allergy symptoms.  Stop Afrin as this is causing increased nasal congestion.  Start Flonase. Instill 1 spray in each nostril twice daily.  You may apply the triamcinolone cream to the rash twice daily as needed.  It was a pleasure meeting you!

## 2017-02-19 ENCOUNTER — Telehealth: Payer: Self-pay | Admitting: *Deleted

## 2017-02-19 NOTE — Telephone Encounter (Signed)
Patient called stating that he received a letter from his GI doctor telling him that he is due a colonoscopy. Patient stated that he is due in July and wants to know if Dr. Damita Dunnings feels that he needs to go ahead and do this or another home test. Patient just wants Dr. Josefine Class opinion.

## 2017-02-19 NOTE — Telephone Encounter (Signed)
Colonoscopy.  H/o polyps.   Thanks.

## 2017-02-20 NOTE — Telephone Encounter (Signed)
Patient advised.

## 2017-02-28 ENCOUNTER — Encounter: Payer: Self-pay | Admitting: Gastroenterology

## 2017-04-25 ENCOUNTER — Other Ambulatory Visit: Payer: Self-pay | Admitting: Primary Care

## 2017-04-25 DIAGNOSIS — J302 Other seasonal allergic rhinitis: Secondary | ICD-10-CM

## 2017-04-25 NOTE — Telephone Encounter (Signed)
Sent. Thanks.   

## 2017-04-25 NOTE — Telephone Encounter (Signed)
Ok to refill? Electronically refill request for  levocetirizine (XYZAL) 5 MG tablet.  Last prescribed by Anda Kraft for an acute visit on 02/14/2017.

## 2017-05-03 ENCOUNTER — Other Ambulatory Visit (INDEPENDENT_AMBULATORY_CARE_PROVIDER_SITE_OTHER): Payer: BLUE CROSS/BLUE SHIELD

## 2017-05-03 DIAGNOSIS — Z125 Encounter for screening for malignant neoplasm of prostate: Secondary | ICD-10-CM | POA: Diagnosis not present

## 2017-05-03 DIAGNOSIS — E119 Type 2 diabetes mellitus without complications: Secondary | ICD-10-CM

## 2017-05-03 DIAGNOSIS — R252 Cramp and spasm: Secondary | ICD-10-CM

## 2017-05-03 LAB — PSA: PSA: 0.68 ng/mL (ref 0.10–4.00)

## 2017-05-03 LAB — VITAMIN B12: Vitamin B-12: 984 pg/mL — ABNORMAL HIGH (ref 211–911)

## 2017-05-03 LAB — HEMOGLOBIN A1C: Hgb A1c MFr Bld: 6.9 % — ABNORMAL HIGH (ref 4.6–6.5)

## 2017-05-04 ENCOUNTER — Ambulatory Visit (AMBULATORY_SURGERY_CENTER): Payer: Self-pay | Admitting: *Deleted

## 2017-05-04 VITALS — Ht 67.0 in | Wt 189.8 lb

## 2017-05-04 DIAGNOSIS — Z86010 Personal history of colonic polyps: Secondary | ICD-10-CM

## 2017-05-04 MED ORDER — NA SULFATE-K SULFATE-MG SULF 17.5-3.13-1.6 GM/177ML PO SOLN: ORAL | 0 refills | Status: DC

## 2017-05-04 NOTE — Progress Notes (Signed)
No egg or soy allergy  No home oxygen used or hx of sleep apnea  No diet medications  No anesthesia or intubation problems per pt

## 2017-05-07 ENCOUNTER — Encounter: Payer: Self-pay | Admitting: Family Medicine

## 2017-05-07 ENCOUNTER — Encounter: Payer: Self-pay | Admitting: Gastroenterology

## 2017-05-07 ENCOUNTER — Ambulatory Visit (INDEPENDENT_AMBULATORY_CARE_PROVIDER_SITE_OTHER): Payer: BLUE CROSS/BLUE SHIELD | Admitting: Family Medicine

## 2017-05-07 DIAGNOSIS — D518 Other vitamin B12 deficiency anemias: Secondary | ICD-10-CM

## 2017-05-07 DIAGNOSIS — E119 Type 2 diabetes mellitus without complications: Secondary | ICD-10-CM | POA: Diagnosis not present

## 2017-05-07 NOTE — Progress Notes (Signed)
Diabetes:  No meds.   Hypoglycemic episodes: no Hyperglycemic episodes: no Feet problems: no Blood Sugars averaging: 130-140 usually in the AM eye exam within last year: due, d/w pt.    PSA wnl.  D/w pt.    B12 higher than prev, just above ULN.  D/w pt.    He is dealing with the death of another relative.  This is on top of his daughter's prev death.  D/w pt.  Support offered.  He is going day by day and getting by.   Colonoscopy pending.     He is having fewer cramps so that is better.    PMH and SH reviewed  Meds, vitals, and allergies reviewed.   ROS: Per HPI unless specifically indicated in ROS section   GEN: nad, alert and oriented HEENT: mucous membranes moist NECK: supple w/o LA CV: rrr. PULM: ctab, no inc wob ABD: soft, +bs EXT: no edema

## 2017-05-07 NOTE — Assessment & Plan Note (Signed)
No meds, he is trying to work on diet and exercise.  Labs d/w pt.  Continue as is. Recheck in about 6 months.  He agrees.

## 2017-05-07 NOTE — Patient Instructions (Signed)
Recheck labs prior to a physical in about 6 months.   Don't change your meds for now.  Update me as needed.  I would get a flu shot each fall.   Take care.  Glad to see you.

## 2017-05-07 NOTE — Assessment & Plan Note (Signed)
b12 improved, would continue a is.

## 2017-05-18 ENCOUNTER — Ambulatory Visit (AMBULATORY_SURGERY_CENTER): Payer: BLUE CROSS/BLUE SHIELD | Admitting: Gastroenterology

## 2017-05-18 ENCOUNTER — Encounter: Payer: Self-pay | Admitting: Gastroenterology

## 2017-05-18 VITALS — BP 104/77 | HR 77 | Temp 98.6°F | Resp 16 | Ht 67.0 in | Wt 189.0 lb

## 2017-05-18 DIAGNOSIS — D124 Benign neoplasm of descending colon: Secondary | ICD-10-CM

## 2017-05-18 DIAGNOSIS — D123 Benign neoplasm of transverse colon: Secondary | ICD-10-CM

## 2017-05-18 DIAGNOSIS — Z86010 Personal history of colonic polyps: Secondary | ICD-10-CM | POA: Diagnosis present

## 2017-05-18 MED ORDER — SODIUM CHLORIDE 0.9 % IV SOLN: 500.0000 mL | INTRAVENOUS | Status: DC

## 2017-05-18 NOTE — Op Note (Signed)
[facility] Patient Name: Thomas Todd Procedure Date: 05/18/2017 9:19 AM MRN: [id number] Endoscopist: Milus Banister , MD Age: 66 Referring MD:  Date of Birth: Jan 26, 1951 Gender: Male Account #: [id number] Procedure:                Colonoscopy Indications:              High risk colon cancer surveillance: Personal                            history of colonic polyps; colonoscopy 2015 7                            polyps, 4 were adenomatous Medicines:                Monitored Anesthesia Care Procedure:                Pre-Anesthesia Assessment:                           - Prior to the procedure, a History and Physical                            was performed, and patient medications and                            allergies were reviewed. The patient's tolerance of                            previous anesthesia was also reviewed. The risks                            and benefits of the procedure and the sedation                            options and risks were discussed with the patient.                            All questions were answered, and informed consent                            was obtained. Prior Anticoagulants: The patient has                            taken no previous anticoagulant or antiplatelet                            agents. ASA Grade Assessment: II - A patient with                            mild systemic disease. After reviewing the risks                            and benefits, the patient was deemed in  satisfactory condition to undergo the procedure.                           After obtaining informed consent, the colonoscope                            was passed under direct vision. Throughout the                            procedure, the patient's blood pressure, pulse, and                            oxygen saturations were monitored continuously. The                            Colonoscope was introduced through the anus  and                            advanced to the the cecum, identified by                            appendiceal orifice and ileocecal valve. The                            colonoscopy was performed without difficulty. The                            patient tolerated the procedure well. The quality                            of the bowel preparation was excellent. The                            ileocecal valve, appendiceal orifice, and rectum                            were photographed. Scope In: 9:23:47 AM Scope Out: 9:40:09 AM Scope Withdrawal Time: 0 hours 13 minutes 49 seconds  Total Procedure Duration: 0 hours 16 minutes 22 seconds  Findings:                 Six sessile polyps were found in the descending                            colon and transverse colon. The polyps were 3 to 10                            mm in size. The smallest 5 polyps were removed with                            a cold snare. The largest polyp (17mm, descending                            segment) was removed with snare/cautery. Resection  and retrieval were complete.                           The exam was otherwise without abnormality on                            direct and retroflexion views. Complications:            No immediate complications. Estimated blood loss:                            None. Estimated Blood Loss:     Estimated blood loss was minimal. Impression:               - Six 3 to 10 mm polyps in the descending colon and                            in the transverse colon, removed with a cold snare                            and hot snare. Resected and retrieved.                           - The examination was otherwise normal on direct                            and retroflexion views. Recommendation:           - Patient has a contact number available for                            emergencies. The signs and symptoms of potential                            delayed  complications were discussed with the                            patient. Return to normal activities tomorrow.                            Written discharge instructions were provided to the                            patient.                           - Resume previous diet.                           - Continue present medications.                           You will receive a letter within 2-3 weeks with the                            pathology results and my final recommendations.  If the polyp(s) is proven to be 'pre-cancerous' on                            pathology, you will need repeat colonoscopy in 3-5                            years. Milus Banister, MD 05/18/2017 9:44:12 AM This report has been signed electronically.

## 2017-05-18 NOTE — Progress Notes (Signed)
Called to room to assist during endoscopic procedure.  Patient ID and intended procedure confirmed with present staff. Received instructions for my participation in the procedure from the performing physician.  

## 2017-05-18 NOTE — Patient Instructions (Signed)
YOU HAD AN ENDOSCOPIC PROCEDURE TODAY AT THE [facility]:   Refer to the procedure report that was given to you for any specific questions about what was found during the examination.  If the procedure report does not answer your questions, please call your gastroenterologist to clarify.  If you requested that your care partner not be given the details of your procedure findings, then the procedure report has been included in a sealed envelope for you to review at your convenience later.  YOU SHOULD EXPECT: Some feelings of bloating in the abdomen. Passage of more gas than usual.  Walking can help get rid of the air that was put into your GI tract during the procedure and reduce the bloating. If you had a lower endoscopy (such as a colonoscopy or flexible sigmoidoscopy) you may notice spotting of blood in your stool or on the toilet paper. If you underwent a bowel prep for your procedure, you may not have a normal bowel movement for a few days.  Please Note:  You might notice some irritation and congestion in your nose or some drainage.  This is from the oxygen used during your procedure.  There is no need for concern and it should clear up in a day or so.  SYMPTOMS TO REPORT IMMEDIATELY:   Following lower endoscopy (colonoscopy or flexible sigmoidoscopy):  Excessive amounts of blood in the stool  Significant tenderness or worsening of abdominal pains  Swelling of the abdomen that is new, acute  Fever of 100F or higher    For urgent or emergent issues, a gastroenterologist can be reached at any hour by calling [phone].   DIET:  We do recommend a small meal at first, but then you may proceed to your regular diet.  Drink plenty of fluids but you should avoid alcoholic beverages for 24 hours.  ACTIVITY:  You should plan to take it easy for the rest of today and you should NOT DRIVE or use heavy machinery until tomorrow (because of the sedation medicines used during the test).     FOLLOW UP: Our staff will call the number listed on your records the next business day following your procedure to check on you and address any questions or concerns that you may have regarding the information given to you following your procedure. If we do not reach you, we will leave a message.  However, if you are feeling well and you are not experiencing any problems, there is no need to return our call.  We will assume that you have returned to your regular daily activities without incident.  If any biopsies were taken you will be contacted by phone or by letter within the next 1-3 weeks.  Please call us at [phone] if you have not heard about the biopsies in 3 weeks.    SIGNATURES/CONFIDENTIALITY: You and/or your care partner have signed paperwork which will be entered into your electronic medical record.  These signatures attest to the fact that that the information above on your After Visit Summary has been reviewed and is understood.  Full responsibility of the confidentiality of this discharge information lies with you and/or your care-partner.   Resume medications. Information given on polyps. 

## 2017-05-18 NOTE — Progress Notes (Signed)
A/ox3 pleased with MAC, report to Sheila RN 

## 2017-05-22 ENCOUNTER — Telehealth: Payer: Self-pay | Admitting: *Deleted

## 2017-05-22 ENCOUNTER — Telehealth: Payer: Self-pay

## 2017-05-22 NOTE — Telephone Encounter (Signed)
Attempted to reach pt. With follow up call following endoscopic procedure 05/18/2017.  LM on pt. Ans machine to call if he has any questions or concerns.

## 2017-05-22 NOTE — Telephone Encounter (Signed)
  Follow up Call-  Call back number 05/18/2017  Post procedure Call Back phone  # [phone]  Permission to leave phone message Yes  Some recent data might be hidden     Patient questions:  Left message on f/u

## 2017-05-26 ENCOUNTER — Other Ambulatory Visit: Payer: Self-pay | Admitting: Primary Care

## 2017-05-26 DIAGNOSIS — J302 Other seasonal allergic rhinitis: Secondary | ICD-10-CM

## 2017-05-27 ENCOUNTER — Encounter: Payer: Self-pay | Admitting: Gastroenterology

## 2017-07-05 ENCOUNTER — Ambulatory Visit: Payer: BLUE CROSS/BLUE SHIELD

## 2017-07-05 ENCOUNTER — Ambulatory Visit (INDEPENDENT_AMBULATORY_CARE_PROVIDER_SITE_OTHER): Payer: BLUE CROSS/BLUE SHIELD

## 2017-07-05 DIAGNOSIS — Z23 Encounter for immunization: Secondary | ICD-10-CM

## 2017-07-25 ENCOUNTER — Other Ambulatory Visit: Payer: Self-pay | Admitting: Family Medicine

## 2017-07-25 DIAGNOSIS — J302 Other seasonal allergic rhinitis: Secondary | ICD-10-CM

## 2017-08-23 ENCOUNTER — Other Ambulatory Visit: Payer: Self-pay | Admitting: Primary Care

## 2017-08-23 DIAGNOSIS — J302 Other seasonal allergic rhinitis: Secondary | ICD-10-CM

## 2017-10-23 ENCOUNTER — Other Ambulatory Visit: Payer: Self-pay | Admitting: Family Medicine

## 2017-10-23 DIAGNOSIS — J302 Other seasonal allergic rhinitis: Secondary | ICD-10-CM

## 2017-11-05 ENCOUNTER — Other Ambulatory Visit: Payer: Self-pay | Admitting: Family Medicine

## 2017-11-05 DIAGNOSIS — D518 Other vitamin B12 deficiency anemias: Secondary | ICD-10-CM

## 2017-11-05 DIAGNOSIS — E119 Type 2 diabetes mellitus without complications: Secondary | ICD-10-CM

## 2017-11-06 ENCOUNTER — Other Ambulatory Visit (INDEPENDENT_AMBULATORY_CARE_PROVIDER_SITE_OTHER): Payer: BLUE CROSS/BLUE SHIELD

## 2017-11-06 DIAGNOSIS — D518 Other vitamin B12 deficiency anemias: Secondary | ICD-10-CM

## 2017-11-06 DIAGNOSIS — E119 Type 2 diabetes mellitus without complications: Secondary | ICD-10-CM

## 2017-11-06 LAB — COMPREHENSIVE METABOLIC PANEL
ALT: 52 U/L (ref 0–53)
AST: 28 U/L (ref 0–37)
Albumin: 4 g/dL (ref 3.5–5.2)
Alkaline Phosphatase: 38 U/L — ABNORMAL LOW (ref 39–117)
BUN: 9 mg/dL (ref 6–23)
CO2: 32 mEq/L (ref 19–32)
Calcium: 9.5 mg/dL (ref 8.4–10.5)
Chloride: 104 mEq/L (ref 96–112)
Creatinine, Ser: 0.78 mg/dL (ref 0.40–1.50)
GFR: 106.36 mL/min (ref >60.00)
Glucose, Bld: 132 mg/dL — ABNORMAL HIGH (ref 70–99)
Potassium: 3.9 mEq/L (ref 3.5–5.1)
Sodium: 140 mEq/L (ref 135–145)
Total Bilirubin: 0.7 mg/dL (ref 0.2–1.2)
Total Protein: 6.2 g/dL (ref 6.0–8.3)

## 2017-11-06 LAB — CBC WITH DIFFERENTIAL/PLATELET
Basophils Absolute: 0 10*3/uL (ref 0.0–0.1)
Basophils Relative: 0.4 % (ref 0.0–3.0)
Eosinophils Absolute: 0.3 10*3/uL (ref 0.0–0.7)
Eosinophils Relative: 3.8 % (ref 0.0–5.0)
HCT: 37.6 % — ABNORMAL LOW (ref 39.0–52.0)
Hemoglobin: 12.9 g/dL — ABNORMAL LOW (ref 13.0–17.0)
Lymphocytes Relative: 51.5 % — ABNORMAL HIGH (ref 12.0–46.0)
Lymphs Abs: 3.6 10*3/uL (ref 0.7–4.0)
MCHC: 34.3 g/dL (ref 30.0–36.0)
MCV: 84.6 fl (ref 78.0–100.0)
Monocytes Absolute: 0.8 10*3/uL (ref 0.1–1.0)
Monocytes Relative: 11.5 % (ref 3.0–12.0)
Neutro Abs: 2.3 10*3/uL (ref 1.4–7.7)
Neutrophils Relative %: 32.8 % — ABNORMAL LOW (ref 43.0–77.0)
Platelets: 187 10*3/uL (ref 150.0–400.0)
RBC: 4.44 Mil/uL (ref 4.22–5.81)
RDW: 13.3 % (ref 11.5–15.5)
WBC: 7 10*3/uL (ref 4.0–10.5)

## 2017-11-06 LAB — VITAMIN B12: Vitamin B-12: 1442 pg/mL — ABNORMAL HIGH (ref 211–911)

## 2017-11-06 LAB — LIPID PANEL
Cholesterol: 211 mg/dL — ABNORMAL HIGH (ref 0–200)
HDL: 39 mg/dL — ABNORMAL LOW (ref >39.00)
LDL Cholesterol: 147 mg/dL — ABNORMAL HIGH (ref 0–99)
NonHDL: 172.49
Total CHOL/HDL Ratio: 5
Triglycerides: 128 mg/dL (ref 0.0–149.0)
VLDL: 25.6 mg/dL (ref 0.0–40.0)

## 2017-11-06 LAB — HEMOGLOBIN A1C: Hgb A1c MFr Bld: 7.4 % — ABNORMAL HIGH (ref 4.6–6.5)

## 2017-11-13 ENCOUNTER — Ambulatory Visit (INDEPENDENT_AMBULATORY_CARE_PROVIDER_SITE_OTHER): Payer: BLUE CROSS/BLUE SHIELD | Admitting: Family Medicine

## 2017-11-13 ENCOUNTER — Encounter: Payer: Self-pay | Admitting: Family Medicine

## 2017-11-13 VITALS — BP 124/72 | HR 66 | Temp 98.7°F | Ht 67.0 in | Wt 188.0 lb

## 2017-11-13 DIAGNOSIS — D518 Other vitamin B12 deficiency anemias: Secondary | ICD-10-CM

## 2017-11-13 DIAGNOSIS — E785 Hyperlipidemia, unspecified: Secondary | ICD-10-CM

## 2017-11-13 DIAGNOSIS — Z0000 Encounter for general adult medical examination without abnormal findings: Secondary | ICD-10-CM | POA: Diagnosis not present

## 2017-11-13 DIAGNOSIS — E119 Type 2 diabetes mellitus without complications: Secondary | ICD-10-CM

## 2017-11-13 DIAGNOSIS — J302 Other seasonal allergic rhinitis: Secondary | ICD-10-CM

## 2017-11-13 DIAGNOSIS — I10 Essential (primary) hypertension: Secondary | ICD-10-CM

## 2017-11-13 DIAGNOSIS — Z7189 Other specified counseling: Secondary | ICD-10-CM

## 2017-11-13 MED ORDER — PRAVASTATIN SODIUM 20 MG PO TABS: 20.0000 mg | ORAL_TABLET | Freq: Every day | ORAL | 3 refills | Status: DC

## 2017-11-13 MED ORDER — LISINOPRIL-HYDROCHLOROTHIAZIDE 10-12.5 MG PO TABS: 1.0000 | ORAL_TABLET | Freq: Every day | ORAL | 3 refills | Status: DC

## 2017-11-13 MED ORDER — VITAMIN B-12 500 MCG PO TABS: 500.0000 ug | ORAL_TABLET | Freq: Every day | ORAL | Status: DC

## 2017-11-13 MED ORDER — LEVOCETIRIZINE DIHYDROCHLORIDE 5 MG PO TABS: ORAL_TABLET | ORAL | 3 refills | Status: DC

## 2017-11-13 MED ORDER — POTASSIUM CHLORIDE ER 10 MEQ PO TBCR: 10.0000 meq | EXTENDED_RELEASE_TABLET | Freq: Two times a day (BID) | ORAL | 3 refills | Status: DC

## 2017-11-13 MED ORDER — FLUTICASONE PROPIONATE 50 MCG/ACT NA SUSP: NASAL | 3 refills | Status: DC

## 2017-11-13 NOTE — Patient Instructions (Addendum)
Check with your insurance to see if they will cover the shingrix shot. If you keep getting lightheaded, then check your BP and cut the lisinopril/HCTZ in half and update me.  Recheck in about 4 months, labs ahead of time.  Keep working on diet and exercise in the meantime.   Cut the B12 back to 527mcg a day.   Take care.  Glad to see you.

## 2017-11-13 NOTE — Progress Notes (Signed)
CPE- See plan.  Routine anticipatory guidance given to patient.  See health maintenance.  The possibility exists that previously documented standard health maintenance information may have been brought forward from a previous encounter into this note.  If needed, that same information has been updated to reflect the current situation based on today's encounter.    Tetanus 2013 Flu up to date Shingles prev done PNA at 28 Colonoscopy 2018 PSA wnl 2018 Living will d/w pt. Wife designated if patient were incapacitated HCV and HIV screening prev done.   Diet and exercise d/w pt. Encouraged both, given the recent upheaval he is doing the best he can.  See below.  He has been trying to adjust to the loss of his daughter.  No SI/HI.  "It's getting better and I'm getting by."    Diabetes:  No meds Hypoglycemic episodes: no sx Hyperglycemic episodes: no sx Feet problems: no  Blood Sugars averaging: not checked often eye exam within last year: done 2018, he'll go again later this year A1c higher than prev.  D/w pt.    Elevated Cholesterol: Using medications without problems:yes Muscle aches: not from statin Diet compliance: encouraged.  Exercise:encouraged  H/o B12 def.  B12 adequate now.  D/w pt about options.    Hypertension:    Using medication without problems or lightheadedness: rarely lightheaded.  D/w pt, see AVS   Chest pain with exertion:no Edema:no Short of breath:no Labs d/w pt.    PMH and SH reviewed  Meds, vitals, and allergies reviewed.   ROS: Per HPI.  Unless specifically indicated otherwise in HPI, the patient denies:  General: fever. Eyes: acute vision changes ENT: sore throat Cardiovascular: chest pain Respiratory: SOB GI: vomiting GU: dysuria Musculoskeletal: acute back pain Derm: acute rash Neuro: acute motor dysfunction Psych: worsening mood Endocrine: polydipsia Heme: bleeding Allergy: hayfever  GEN: nad, alert and oriented HEENT: mucous  membranes moist NECK: supple w/o LA CV: rrr. PULM: ctab, no inc wob ABD: soft, +bs EXT: no edema SKIN: no acute rash  Diabetic foot exam: Normal inspection No skin breakdown No calluses  Normal DP pulses Normal sensation to light touch and monofilament Nails normal

## 2017-11-15 NOTE — Assessment & Plan Note (Signed)
No change in meds now.  If getting lightheaded, then he'll check BP and cut the lisinopril/HCTZ in half and update me.  Labs d/w pt.  He agrees.

## 2017-11-15 NOTE — Assessment & Plan Note (Signed)
B12 adequate now.  D/w pt about options.   Recheck in about 4 months, labs ahead of time.  Cut B12 back to 543mcg a day.

## 2017-11-15 NOTE — Assessment & Plan Note (Signed)
Continue work on diet and exercise.  No change in meds.  Labs d/w pt.  He agrees.

## 2017-11-15 NOTE — Assessment & Plan Note (Signed)
Tetanus 2013 Flu up to date Shingles prev done PNA at 67 Colonoscopy 2018 PSA wnl 2018 Living will d/w pt. Wife designated if patient were incapacitated HCV and HIV screening prev done.   Diet and exercise d/w pt. Encouraged both, given the recent upheaval he is doing the best he can.  See below.

## 2017-11-15 NOTE — Assessment & Plan Note (Signed)
Living will d/w pt.  Wife designated if patient were incapacitated.   ?

## 2017-11-15 NOTE — Assessment & Plan Note (Signed)
Continue work on diet and exercise.  No meds at this point.  Labs d/w pt.  He agrees.  Recheck in a few months.  He agrees.  See AVS.

## 2018-01-28 ENCOUNTER — Encounter: Payer: Self-pay | Admitting: Family Medicine

## 2018-01-28 LAB — HM DIABETES EYE EXAM

## 2018-03-12 ENCOUNTER — Other Ambulatory Visit (INDEPENDENT_AMBULATORY_CARE_PROVIDER_SITE_OTHER): Payer: BLUE CROSS/BLUE SHIELD

## 2018-03-12 DIAGNOSIS — D518 Other vitamin B12 deficiency anemias: Secondary | ICD-10-CM

## 2018-03-12 DIAGNOSIS — E119 Type 2 diabetes mellitus without complications: Secondary | ICD-10-CM

## 2018-03-12 LAB — HEMOGLOBIN A1C: Hgb A1c MFr Bld: 7.4 % — ABNORMAL HIGH (ref 4.6–6.5)

## 2018-03-12 LAB — VITAMIN B12: Vitamin B-12: 1088 pg/mL — ABNORMAL HIGH (ref 211–911)

## 2018-03-15 ENCOUNTER — Ambulatory Visit: Payer: BLUE CROSS/BLUE SHIELD | Admitting: Family Medicine

## 2018-03-15 ENCOUNTER — Encounter: Payer: Self-pay | Admitting: Family Medicine

## 2018-03-15 VITALS — BP 124/78 | HR 69 | Temp 98.5°F | Wt 189.0 lb

## 2018-03-15 DIAGNOSIS — D518 Other vitamin B12 deficiency anemias: Secondary | ICD-10-CM

## 2018-03-15 DIAGNOSIS — R55 Syncope and collapse: Secondary | ICD-10-CM | POA: Diagnosis not present

## 2018-03-15 DIAGNOSIS — E119 Type 2 diabetes mellitus without complications: Secondary | ICD-10-CM

## 2018-03-15 LAB — CBC WITH DIFFERENTIAL/PLATELET
Basophils Absolute: 0 10*3/uL (ref 0.0–0.1)
Basophils Relative: 0.6 % (ref 0.0–3.0)
Eosinophils Absolute: 0.2 10*3/uL (ref 0.0–0.7)
Eosinophils Relative: 3.2 % (ref 0.0–5.0)
HCT: 41 % (ref 39.0–52.0)
Hemoglobin: 13.7 g/dL (ref 13.0–17.0)
Lymphocytes Relative: 37.1 % (ref 12.0–46.0)
Lymphs Abs: 2.5 10*3/uL (ref 0.7–4.0)
MCHC: 33.5 g/dL (ref 30.0–36.0)
MCV: 85.8 fl (ref 78.0–100.0)
Monocytes Absolute: 0.7 10*3/uL (ref 0.1–1.0)
Monocytes Relative: 10.2 % (ref 3.0–12.0)
Neutro Abs: 3.3 10*3/uL (ref 1.4–7.7)
Neutrophils Relative %: 48.9 % (ref 43.0–77.0)
Platelets: 184 10*3/uL (ref 150.0–400.0)
RBC: 4.78 Mil/uL (ref 4.22–5.81)
RDW: 13.4 % (ref 11.5–15.5)
WBC: 6.8 10*3/uL (ref 4.0–10.5)

## 2018-03-15 LAB — COMPREHENSIVE METABOLIC PANEL
ALT: 26 U/L (ref 0–53)
AST: 14 U/L (ref 0–37)
Albumin: 4.6 g/dL (ref 3.5–5.2)
Alkaline Phosphatase: 38 U/L — ABNORMAL LOW (ref 39–117)
BUN: 17 mg/dL (ref 6–23)
CO2: 26 mEq/L (ref 19–32)
Calcium: 9.7 mg/dL (ref 8.4–10.5)
Chloride: 104 mEq/L (ref 96–112)
Creatinine, Ser: 0.79 mg/dL (ref 0.40–1.50)
GFR: 104.69 mL/min (ref >60.00)
Glucose, Bld: 149 mg/dL — ABNORMAL HIGH (ref 70–99)
Potassium: 4.1 mEq/L (ref 3.5–5.1)
Sodium: 140 mEq/L (ref 135–145)
Total Bilirubin: 0.5 mg/dL (ref 0.2–1.2)
Total Protein: 6.6 g/dL (ref 6.0–8.3)

## 2018-03-15 LAB — TSH: TSH: 0.85 u[IU]/mL (ref 0.35–4.50)

## 2018-03-15 MED ORDER — LISINOPRIL-HYDROCHLOROTHIAZIDE 10-12.5 MG PO TABS: 0.5000 | ORAL_TABLET | Freq: Every day | ORAL | Status: DC

## 2018-03-15 NOTE — Progress Notes (Signed)
Diabetes:  No meds Hypoglycemic episodes: no sx Hyperglycemic episodes: no sx Feet problems: no  Blood Sugars averaging: not checked often eye exam within last year: yes A1c not changed from prev.  D/w pt.    Likely syncope 2 months ago. No h/o SZ.  No tongue biting, no shaking.  The episode started after he had eaten a salad.  He had some mild bloating.  He had a bowel movement later that night.  At that point he apparently passed out when he was in the bathroom.  He woke up on the floor.  Thereafter he passed out twice again in the bathroom.  He recalls coming to 3 separate times.  He recalls being drenched in sweat.  He was eventually able to get up and get cleaned up and get back to bed.  All of this happened on the same night, with the same trip to the bathroom.  He has not passed out otherwise.  I am just hearing about this with the first time today.  He is occ mildly lightheaded but not now.    He is still taking 554mcg B12 per day.  His level is lower but still slightly high overall.    He had shingrix vaccine, 1st dose.  D/w pt.    Meds, vitals, and allergies reviewed.   ROS: Per HPI unless specifically indicated in ROS section   GEN: nad, alert and oriented HEENT: mucous membranes moist NECK: supple w/o LA CV: rrr. No murmur PULM: ctab, no inc wob ABD: soft, +bs EXT: no edema SKIN: no acute rash

## 2018-03-15 NOTE — Patient Instructions (Addendum)
Go to the lab on the way out.  We'll contact you with your lab report. We will call about your referral.  Rosaria Ferries or Azalee Course will call you if you don't see one of them on the way out.  Cut the lisinopril tabs in half in the meantime.  Take care.  Glad to see you.   Plan on a recheck in about 4 months re: diabetes.

## 2018-03-17 DIAGNOSIS — R55 Syncope and collapse: Secondary | ICD-10-CM | POA: Insufficient documentation

## 2018-03-17 NOTE — Assessment & Plan Note (Signed)
He is still taking 545mcg B12 per day.  His level is lower but still slightly high overall.   Continue as is for now and recheck in a few months.  He agrees.

## 2018-03-17 NOTE — Assessment & Plan Note (Signed)
This sounds like a vagal episode related to a bowel movement.  He did not have chest pain or shortness of breath.  He had no typical seizure symptoms.  He has not passed out otherwise.  He has no seizure history.  EKG is normal.  Check routine labs today.  Refer to cardiology.  At this point still okay for outpatient follow-up. I did ask him to cut his lisinopril hydrochlorothiazide in half just to decrease the likelihood of symptomatic orthostasis. >25 minutes spent in face to face time with patient, >50% spent in counselling or coordination of care.

## 2018-03-17 NOTE — Assessment & Plan Note (Signed)
Plan on a recheck in about 4 months re: diabetes.   No change in meds at this point.  The episode above does not sound to be related to hypoglycemia but I do not want to induce hypoglycemia.  Discussed with patient.  He agrees.

## 2018-03-18 ENCOUNTER — Encounter: Payer: Self-pay | Admitting: *Deleted

## 2018-05-02 NOTE — Progress Notes (Signed)
Cardiology Office Note  Date:  05/03/2018   ID:  Thomas, Todd 10-30-1950, MRN [id number]  PCP:  Tonia Ghent, MD   Chief Complaint  Patient presents with  . OTHER    Syncope/orthastatic BP. Meds reviewed verbally with pt.    HPI:  [name] is a 67 general with past medical history of Former smoker, quit 9  Quit smoking,quit 67 yo Diabetes Hba1c 7.4 Ho presents by referral from Dr. Damita Dunnings for consultation of his syncope  Several months ago reports having salad with dressing,   from local grocery store Later that evening developed abdominal bloating discomfort Had bowel urgency,  Before being up to have a bowel movement felt dizzy, passed out in the bathroom woke up on the floor Urinated on himself Tried to stand up several more times but was dizzy and continued to pass out Eventually wife assisted him to the toilet, had a bowel movement Stood up at the sink drenched in sweat shaking eventually made it to bed laid down and symptoms resolved  Denies having any prior episodes as detailed above None since that time  Otherwise active, does lots of gardening without any symptoms of chest pain shortness of breath  EKG personally reviewed by myself on todays visit Shows NSR with rate 78 bpm no significant ST or T-wave changes   PMH:   has a past medical history of Allergy, Anemia, Arthritis, Diabetes mellitus without complication ([place]), GERD (gastroesophageal reflux disease), Hyperlipidemia, Hypertension, LBP (low back pain), and Pancreatitis.  PSH:    Past Surgical History:  Procedure Laterality Date  . COLONOSCOPY    . dental implant    . LAMINECTOMY  1993   L5/S1  . SEPTOPLASTY  1970    Current Outpatient Medications  Medication Sig Dispense Refill  . Ascorbic Acid (VITAMIN C) 1000 MG tablet Take 1,000 mg by mouth daily.    Marland Kitchen aspirin EC 81 MG tablet Take 81 mg by mouth daily.    . cholecalciferol (VITAMIN D) 1000 UNITS tablet Take 1,000 Units by  mouth daily.      Marland Kitchen docusate sodium (COLACE) 100 MG capsule Take 100 mg by mouth 2 (two) times daily.    Marland Kitchen esomeprazole (NEXIUM) 40 MG capsule Take 40 mg by mouth daily at 12 noon.    . fluticasone (FLONASE) 50 MCG/ACT nasal spray PLACE 1 SPRAY INTO BOTH NOSTRILS TWICE DAILY 48 g 3  . folic acid (FOLVITE) 322 MCG tablet Take 400 mcg by mouth daily.    Marland Kitchen levocetirizine (XYZAL) 5 MG tablet TAKE 1 TABLET BY MOUTH ONCE EVERY EVENING FOR ALLERGIES AND RASH 90 tablet 3  . lisinopril-hydrochlorothiazide (PRINZIDE,ZESTORETIC) 10-12.5 MG tablet Take 0.5 tablets by mouth daily.    . Omega-3 Fatty Acids (FISH OIL PO) Take 2,400 mg by mouth daily.     . potassium chloride (K-DUR) 10 MEQ tablet Take 1 tablet (10 mEq total) by mouth 2 (two) times daily. 180 tablet 3  . pravastatin (PRAVACHOL) 20 MG tablet Take 1 tablet (20 mg total) by mouth daily. 90 tablet 3  . vitamin B-12 (CYANOCOBALAMIN) 500 MCG tablet Take 1 tablet (500 mcg total) by mouth daily.     Current Facility-Administered Medications  Medication Dose Route Frequency Provider Last Rate Last Dose  . 0.9 %  sodium chloride infusion  500 mL Intravenous Continuous Milus Banister, MD         Allergies:   Atorvastatin   Social History:  The patient  reports  that he quit smoking about 8 years ago. He quit after 40.00 years of use. He has never used smokeless tobacco. He reports that he does not drink alcohol or use drugs.   Family History:   family history includes Alcohol abuse in his father; Dementia in his mother; Diabetes in his mother and sister; Heart attack in his father; Heart disease in his father; Hyperlipidemia in his mother; Hypertension in his mother; Hypothyroidism in his mother.    Review of Systems: Review of Systems  Constitutional: Negative.   Respiratory: Negative.   Cardiovascular: Negative.   Gastrointestinal: Negative.   Musculoskeletal: Negative.   Neurological: Positive for loss of consciousness.   Psychiatric/Behavioral: Negative.   All other systems reviewed and are negative.    PHYSICAL EXAM: VS:  BP (!) 144/85 (BP Location: Left Arm, Patient Position: Sitting, Cuff Size: Normal)   Pulse 78   Ht 5\' 9"  (1.753 m)   Wt 191 lb (86.6 kg)   BMI 28.21 kg/m  , BMI Body mass index is 28.21 kg/m. GEN: Well nourished, well developed, in no acute distress  HEENT: normal  Neck: no JVD, carotid bruits, or masses Cardiac: RRR; no murmurs, rubs, or gallops,no edema  Respiratory:  clear to auscultation bilaterally, normal work of breathing GI: soft, nontender, nondistended, + BS MS: no deformity or atrophy  Skin: warm and dry, no rash Neuro:  Strength and sensation are intact Psych: euthymic mood, full affect    Recent Labs: 03/15/2018: ALT 26; BUN 17; Creatinine, Ser 0.79; Hemoglobin 13.7; Platelets 184.0; Potassium 4.1; Sodium 140; TSH 0.85    Lipid Panel Lab Results  Component Value Date   CHOL 211 (H) 11/06/2017   HDL 39.00 (L) 11/06/2017   LDLCALC 147 (H) 11/06/2017   TRIG 128.0 11/06/2017      Wt Readings from Last 3 Encounters:  05/03/18 191 lb (86.6 kg)  03/15/18 189 lb (85.7 kg)  11/13/17 188 lb (85.3 kg)       ASSESSMENT AND PLAN:  Vasovagal syncope - Plan: EKG 12-Lead Likely GI related in the setting of intestinal distress No prior episodes and none since that time Since then blood pressure pill cut in half Recommended he stay hydrated Normal clinical exam and EKG No further workup needed  Type 2 diabetes mellitus with complication, with long-term current use of insulin ([place]) We have encouraged continued exercise, careful diet management in an effort to lose weight. Hemoglobin A1c 7.4 Long discussion with him concerning need to get more aggressive with his diet  Former smoker Reports that he stop smoking many years ago Significant risk factor for coronary disease  Mixed hyperlipidemia Reports having some cramping possible myalgias We'll  continue his current dose of statin We did discuss that he consider starting zetia. He will talk with Dr. Damita Dunnings Also suggested he consider CT coronary calcium scoring for risk stratification  Disposition:   F/U  As needed  Patient was seen in consultation for Dr. Damita Dunnings and will be referred back to his office for ongoing care of issues detailed above   Total encounter time more than 60 minutes  Greater than 50% was spent in counseling and coordination of care with the patient    Orders Placed This Encounter  Procedures  . EKG 12-Lead     Signed, Esmond Plants, M.D., Ph.D. 05/03/2018  [place], [place]

## 2018-05-03 ENCOUNTER — Ambulatory Visit: Payer: BLUE CROSS/BLUE SHIELD | Admitting: Cardiovascular Disease

## 2018-05-03 ENCOUNTER — Encounter: Payer: Self-pay | Admitting: Cardiovascular Disease

## 2018-05-03 ENCOUNTER — Encounter

## 2018-05-03 VITALS — BP 144/85 | HR 78 | Ht 69.0 in | Wt 191.0 lb

## 2018-05-03 DIAGNOSIS — Z87891 Personal history of nicotine dependence: Secondary | ICD-10-CM

## 2018-05-03 DIAGNOSIS — E119 Type 2 diabetes mellitus without complications: Secondary | ICD-10-CM

## 2018-05-03 DIAGNOSIS — E782 Mixed hyperlipidemia: Secondary | ICD-10-CM

## 2018-05-03 DIAGNOSIS — E118 Type 2 diabetes mellitus with unspecified complications: Secondary | ICD-10-CM | POA: Diagnosis not present

## 2018-05-03 DIAGNOSIS — Z794 Long term (current) use of insulin: Secondary | ICD-10-CM

## 2018-05-03 DIAGNOSIS — R55 Syncope and collapse: Secondary | ICD-10-CM

## 2018-05-03 DIAGNOSIS — I10 Essential (primary) hypertension: Secondary | ICD-10-CM

## 2018-05-03 NOTE — Patient Instructions (Addendum)
Medication Instructions:   Consider zetia one a day for cholesterol Drops 20%  Labwork:  No new labs needed  Testing/Procedures:  Think about CT coronary calcium score [place], $150   Follow-Up: It was a pleasure seeing you in the office today. Please call us if you have new issues that need to be addressed before your next appt.  [phone]  Your physician wants you to follow-up in: As needed  If you need a refill on your cardiac medications before your next appointment, please call your pharmacy.  For educational health videos Log in to : [email or web address] Or : [email or web address], password : [email or web address]

## 2018-07-16 ENCOUNTER — Ambulatory Visit (INDEPENDENT_AMBULATORY_CARE_PROVIDER_SITE_OTHER): Payer: PPO | Admitting: Family Medicine

## 2018-07-16 ENCOUNTER — Encounter: Payer: Self-pay | Admitting: Family Medicine

## 2018-07-16 VITALS — BP 138/72 | HR 68 | Temp 98.4°F | Wt 188.0 lb

## 2018-07-16 DIAGNOSIS — Z23 Encounter for immunization: Secondary | ICD-10-CM | POA: Diagnosis not present

## 2018-07-16 DIAGNOSIS — L989 Disorder of the skin and subcutaneous tissue, unspecified: Secondary | ICD-10-CM | POA: Diagnosis not present

## 2018-07-16 DIAGNOSIS — D518 Other vitamin B12 deficiency anemias: Secondary | ICD-10-CM | POA: Diagnosis not present

## 2018-07-16 DIAGNOSIS — M6530 Trigger finger, unspecified finger: Secondary | ICD-10-CM

## 2018-07-16 DIAGNOSIS — Z8249 Family history of ischemic heart disease and other diseases of the circulatory system: Secondary | ICD-10-CM

## 2018-07-16 DIAGNOSIS — E119 Type 2 diabetes mellitus without complications: Secondary | ICD-10-CM

## 2018-07-16 LAB — VITAMIN B12: Vitamin B-12: 1490 pg/mL — ABNORMAL HIGH (ref 211–911)

## 2018-07-16 LAB — HEMOGLOBIN A1C: Hgb A1c MFr Bld: 7.5 % — ABNORMAL HIGH (ref 4.6–6.5)

## 2018-07-16 NOTE — Patient Instructions (Addendum)
Check with your insurance to see which meter and strips they will cover and let us know. We can send it in.  Go to the lab on the way out.  We'll contact you with your lab report. Please ask the eye clinic to send me a note.  We will call about your referral.  Rosaria Ferries or Azalee Course will call you if you don't see one of them on the way out.  Take care.  Glad to see you.  Ask about a 30 minute appointment for me to work on the spot on your neck.

## 2018-07-16 NOTE — Progress Notes (Signed)
Diabetes:  Using medications without difficulties: no meds Hypoglycemic episodes: no sx Hyperglycemic episodes: no sx Feet problems: no Blood Sugars averaging: ~130s on recent checks eye exam within last year: yes, done per patient report in 2019, prior to September of this year.    We talked about possible cardiac CT. he is not having chest pain.  Family history of coronary disease noted.  Likely reasonable to get A1c done today and go from there.   He is still dealing with the death of his daughter.  He is trying to work through the situation.  H/o elevated B12, due for f/u labs.  Taking 549mcg a day.    L trigger finger noted.  He was asking about options.  Pain when it gets locked.  Pneumococcal vaccine discussed with patient.  At office visit.  Meds, vitals, and allergies reviewed.  ROS: Per HPI unless specifically indicated in ROS section   GEN: nad, alert and oriented HEENT: mucous membranes moist NECK: supple w/o LA CV: rrr. PULM: ctab, no inc wob ABD: soft, +bs EXT: no edema SKIN: no acute rash L 2nd finger triggering and locking at the PIP.   11mm papule on the R side of the neck, possible BCC

## 2018-07-19 ENCOUNTER — Other Ambulatory Visit: Payer: Self-pay | Admitting: Family Medicine

## 2018-07-19 DIAGNOSIS — L989 Disorder of the skin and subcutaneous tissue, unspecified: Secondary | ICD-10-CM | POA: Insufficient documentation

## 2018-07-19 DIAGNOSIS — D518 Other vitamin B12 deficiency anemias: Secondary | ICD-10-CM

## 2018-07-19 DIAGNOSIS — Z8249 Family history of ischemic heart disease and other diseases of the circulatory system: Secondary | ICD-10-CM | POA: Insufficient documentation

## 2018-07-19 DIAGNOSIS — E119 Type 2 diabetes mellitus without complications: Secondary | ICD-10-CM

## 2018-07-19 DIAGNOSIS — M6530 Trigger finger, unspecified finger: Secondary | ICD-10-CM | POA: Insufficient documentation

## 2018-07-19 NOTE — Assessment & Plan Note (Signed)
He may benefit from injection.  Discussed.  Refer to sports medicine clinic.

## 2018-07-19 NOTE — Assessment & Plan Note (Addendum)
6 mm papule on the right side of the neck that could be a possible basal cell cancer.  It may be benign but it likely needs excision.  Discussed with patient about options.  He will return for excision.

## 2018-07-19 NOTE — Assessment & Plan Note (Signed)
Already on statin.  He is working to control his diabetes.  Not smoking.  Reasonable to consider cardiac CT.  Discussed.  No chest pain.

## 2018-07-19 NOTE — Assessment & Plan Note (Signed)
Not on any medications lower sugar.  Due for A1c.  Sugar has been around 130 on recent checks.  Discussed diet and exercise.  See notes on labs.

## 2018-07-19 NOTE — Assessment & Plan Note (Signed)
Taking 500 mcg a day.  Due for follow-up labs.  See notes on labs.

## 2018-07-21 ENCOUNTER — Telehealth: Payer: Self-pay | Admitting: Family Medicine

## 2018-07-21 DIAGNOSIS — Z9189 Other specified personal risk factors, not elsewhere classified: Secondary | ICD-10-CM

## 2018-07-21 NOTE — Telephone Encounter (Signed)
Call pt.  I checked on getting the CT calcium scoring set up.  It may cost ~$150 and his insurance may not cover it.  I put in the order in the meantime, but I didn't want him to be surprised by the bill.  Thanks.

## 2018-07-22 ENCOUNTER — Telehealth: Payer: Self-pay | Admitting: Family Medicine

## 2018-07-22 NOTE — Telephone Encounter (Signed)
Called the patient and had to [facility] to call Rosaria Ferries back at [phone] regarding two Referral's.

## 2018-07-22 NOTE — Telephone Encounter (Signed)
Left detailed message on voicemail.  

## 2018-08-01 ENCOUNTER — Telehealth: Payer: Self-pay | Admitting: Family Medicine

## 2018-08-01 NOTE — Telephone Encounter (Signed)
Left message asking pt to call office Please r/s 12/2 appointment with dr copland. Offer dr Paulla Fore or dr schmitz If pt wants to wait for dr copland r/s to [facility]

## 2018-08-13 ENCOUNTER — Ambulatory Visit (INDEPENDENT_AMBULATORY_CARE_PROVIDER_SITE_OTHER): Payer: PPO | Admitting: Family Medicine

## 2018-08-13 ENCOUNTER — Encounter: Payer: Self-pay | Admitting: Family Medicine

## 2018-08-13 ENCOUNTER — Ambulatory Visit (INDEPENDENT_AMBULATORY_CARE_PROVIDER_SITE_OTHER)
Admission: RE | Admit: 2018-08-13 | Discharge: 2018-08-13 | Disposition: A | Discharge status: Home / Self Care | Payer: Self-pay | Source: Ambulatory Visit | Attending: Family Medicine | Admitting: Family Medicine

## 2018-08-13 VITALS — BP 128/82 | HR 73 | Temp 98.6°F | Ht 69.0 in | Wt 190.2 lb

## 2018-08-13 DIAGNOSIS — L989 Disorder of the skin and subcutaneous tissue, unspecified: Secondary | ICD-10-CM | POA: Diagnosis not present

## 2018-08-13 DIAGNOSIS — Z9189 Other specified personal risk factors, not elsewhere classified: Secondary | ICD-10-CM

## 2018-08-13 DIAGNOSIS — C4441 Basal cell carcinoma of skin of scalp and neck: Secondary | ICD-10-CM | POA: Diagnosis not present

## 2018-08-13 NOTE — Patient Instructions (Signed)
Keep the area clean and covered. Gently wash with soapy water.  We'll contact you with your lab report. Update me as needed.   If you have any spreading redness or draining pus the call the clinic.  Take care.  Glad to see you.

## 2018-08-13 NOTE — Progress Notes (Signed)
67mm papule on the R side of the neck, possible BCC.  D/w pt.    Shave biopsy  Meds, vitals, and allergies reviewed.   Indication: 15mm papule on the R side of the neck, possible BCC  Informed consent obtained.  Pt aware of risks not limited to but including infection, bleeding, damage to near by organs.  Prep: etoh/betadine  Anesthesia: 1%lidocaine with epi, good effect  Shave made with dermablade  Minimal oozing, controlled with silver nitrate, base abraded and then silver nitrate reapplied, minimal oozing, controlled.   Tolerated well, no complications.

## 2018-08-13 NOTE — Assessment & Plan Note (Signed)
36mm papule on the R side of the neck, possible BCC.  D/w pt.   Routine postprocedure instructions d/w pt- keep area clean and bandaged, follow up if concerns/spreading erythema/pain. He agrees.  Await path report.

## 2018-08-14 ENCOUNTER — Other Ambulatory Visit: Payer: Self-pay | Admitting: Family Medicine

## 2018-08-14 DIAGNOSIS — E785 Hyperlipidemia, unspecified: Secondary | ICD-10-CM

## 2018-08-19 ENCOUNTER — Ambulatory Visit: Payer: PPO | Admitting: Family Medicine

## 2018-08-20 ENCOUNTER — Telehealth: Payer: Self-pay | Admitting: Family Medicine

## 2018-08-20 ENCOUNTER — Other Ambulatory Visit (INDEPENDENT_AMBULATORY_CARE_PROVIDER_SITE_OTHER): Payer: PPO

## 2018-08-20 DIAGNOSIS — E785 Hyperlipidemia, unspecified: Secondary | ICD-10-CM | POA: Diagnosis not present

## 2018-08-20 LAB — LIPID PANEL
Cholesterol: 268 mg/dL — ABNORMAL HIGH (ref 0–200)
HDL: 37.1 mg/dL — ABNORMAL LOW (ref >39.00)
NonHDL: 231.32
Total CHOL/HDL Ratio: 7
Triglycerides: 262 mg/dL — ABNORMAL HIGH (ref 0.0–149.0)
VLDL: 52.4 mg/dL — ABNORMAL HIGH (ref 0.0–40.0)

## 2018-08-20 LAB — LDL CHOLESTEROL, DIRECT: Direct LDL: 187 mg/dL

## 2018-08-20 NOTE — Telephone Encounter (Signed)
I saw this note after I talked to him.    I talked with patient about his skin biopsy, okay for routine local care.  I checked the area.  Appears to have normal healing.  If he has anything other than smooth skin when it heals, we can shave it again if needed.  He agrees.    We talked about his heart CT and the rationale for medical care.  I'll await the f/u lipids.  No CP, not SOB.   He agrees with plan.

## 2018-08-20 NOTE — Telephone Encounter (Signed)
Patient came into the office for lab work today. He wanted to ask if there was anything he needed to do for the spot on his neck where there was basal cell and was removed. Please advise.

## 2018-08-26 ENCOUNTER — Other Ambulatory Visit: Payer: Self-pay | Admitting: Family Medicine

## 2018-08-26 DIAGNOSIS — E785 Hyperlipidemia, unspecified: Secondary | ICD-10-CM

## 2018-08-26 MED ORDER — EZETIMIBE 10 MG PO TABS: 10.0000 mg | ORAL_TABLET | Freq: Every day | ORAL | 3 refills | Status: DC

## 2018-09-01 ENCOUNTER — Other Ambulatory Visit: Payer: Self-pay | Admitting: Family Medicine

## 2018-09-01 DIAGNOSIS — I10 Essential (primary) hypertension: Secondary | ICD-10-CM

## 2018-09-01 DIAGNOSIS — Z125 Encounter for screening for malignant neoplasm of prostate: Secondary | ICD-10-CM

## 2018-09-04 ENCOUNTER — Telehealth: Payer: Self-pay | Admitting: Family Medicine

## 2018-09-04 NOTE — Telephone Encounter (Signed)
Left message asking pt to call office please let pt know his appointment on 09/19/18 with Dr Lorelei Pont appointment time has changed to 9:40

## 2018-09-16 ENCOUNTER — Telehealth: Payer: Self-pay | Admitting: Family Medicine

## 2018-09-16 MED ORDER — FLUCONAZOLE 150 MG PO TABS: ORAL_TABLET | ORAL | 0 refills | Status: DC

## 2018-09-16 NOTE — Telephone Encounter (Signed)
I need more information.   Where exactly is the white matter?   Is he having discharge from the end of the penis - from the hole where his urine comes out or is it somewhere else on his penis?

## 2018-09-16 NOTE — Telephone Encounter (Signed)
Spoke with Thomas Todd.  He states he still has his foreskin and the white matter is on the foreskin.  This has been going on for about a month.  He bought clotrimazole cream from the drug store and has used that x 2 weeks.  He denies any burning, itching or urinary problems.  He denies any drainage and states he has not had intercourse in a while.  He states this didn't happen until he started taking Zetia and doesn't not if this is related at all.  Dr. Lorelei Pont notified and also spoke with patient while I had him on the phone.  Will send in Rx for Diflucan.  Patient will keep appointment on 09/19/2018 for trigger finger.

## 2018-09-16 NOTE — Telephone Encounter (Signed)
Pt thinks medication zetia gave him a yeast infection. He has white discharge but no pain or other symptoms at this time. I offered sooner appt but he said he will wait to have it looked at  appt with Dr Copland1/2/20 but would like to know if there is anything he can do in the meantime.

## 2018-09-16 NOTE — Telephone Encounter (Signed)
Sounds to be classic balanitis. Able to retract foreskin. Using clotrimazole, but still with some symptoms. Send in diflucan.

## 2018-09-17 NOTE — Progress Notes (Signed)
Dr. Karleen Hampshire T. Normal Recinos, MD, CAQ [facility] [address] [place], [address] Phone: [phone] Fax: [phone]  09/19/2018  Patient: Thomas Todd, MRN: [id number], DOB: 1951/05/30, 67 y.o.  Primary Physician:  Joaquim Nam, MD   Chief Complaint  Patient presents with  . Trigger finger    Left Index    Subjective:   Thomas Todd is a 67 y.o. very pleasant male patient who presents with the following:  Patient is here for evaluation of left-sided index finger pain, and he also is here for follow-up on some balanitis.  He has been using some clotrimazole cream for about 1 month, and I subsequently spoke to him on the phone 2 days prior, and called in some Diflucan for him given his persistent symptoms.  Trigger finger injection on the left  Past Medical History, Surgical History, Social History, Family History, Problem List, Medications, and Allergies have been reviewed and updated if relevant.  Patient Active Problem List   Diagnosis Date Noted  . Skin lesion 07/19/2018  . Trigger finger 07/19/2018  . Family history of heart disease 07/19/2018  . Syncope 03/17/2018  . Advance care planning 08/07/2014  . Radicular pain 08/21/2012  . Routine general medical examination at a health care facility 02/13/2012  . ANEMIA, VITAMIN B12 DEFICIENCY 07/15/2009  . UNSPECIFIED VITAMIN D DEFICIENCY 04/14/2009  . BACK PAIN 08/16/2007  . TESTOSTERONE DEFICIENCY 03/04/2007  . Hyperlipidemia 03/04/2007  . ERECTILE DYSFUNCTION 03/04/2007  . HYPERTENSION, BENIGN ESSENTIAL 03/04/2007  . GERD 03/04/2007  . Diabetes mellitus without complication (HCC) 03/04/2007  . BPH (benign prostatic hyperplasia) 03/01/2007  . TOBACCO ABUSE, HX OF 03/01/2007    Past Medical History:  Diagnosis Date  . Allergy   . Anemia   . Arthritis   . Diabetes mellitus without complication (HCC)    no medications taken 05-04-17  . GERD (gastroesophageal  reflux disease)   . Hyperlipidemia   . Hypertension   . LBP (low back pain)    with sciatica   . Pancreatitis    Pancreatitis etoh 08/1998    Past Surgical History:  Procedure Laterality Date  . COLONOSCOPY    . dental implant    . LAMINECTOMY  1993   L5/S1  . SEPTOPLASTY  1970    Social History   Socioeconomic History  . Marital status: Married    Spouse name: Not on file  . Number of children: Not on file  . Years of education: Not on file  . Highest education level: Not on file  Occupational History  . Not on file  Social Needs  . Financial resource strain: Not on file  . Food insecurity:    Worry: Not on file    Inability: Not on file  . Transportation needs:    Medical: Not on file    Non-medical: Not on file  Tobacco Use  . Smoking status: Former Smoker    Years: 40.00    Last attempt to quit: 09/17/2009    Years since quitting: 9.0  . Smokeless tobacco: Never Used  . Tobacco comment: vapor  Substance and Sexual Activity  . 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 Drug use: No  . Sexual activity: Not on file  Lifestyle  . Physical activity:    Days per week: Not on file    Minutes per session: Not on file  . Stress: Not on file  Relationships  .  Social connections:    Talks on phone: Not on file    Gets together: Not on file    Attends religious service: Not on file    Active member of club or organization: Not on file    Attends meetings of clubs or organizations: Not on file    Relationship status: Not on file  . Intimate partner violence:    Fear of current or ex partner: Not on file    Emotionally abused: Not on file    Physically abused: Not on file    Forced sexual activity: Not on file  Other Topics Concern  . Not on file  Social History Narrative   Sober since 1999   Prev did [employer], 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, retired as of 2015   Married 1975   Prev had 1 daughter, was chronically ill and died Jul 08, 2016     Family History  Problem Relation Age of Onset  . Hypertension Mother   . Diabetes Mother   . Hyperlipidemia Mother   . Hypothyroidism Mother   . Dementia Mother   . Alcohol abuse Father   . Heart disease Father   . Heart attack Father   . Diabetes Sister   . Colon cancer Neg Hx   . Prostate cancer Neg Hx   . Esophageal cancer Neg Hx   . Stomach cancer Neg Hx   . Rectal cancer Neg Hx     Allergies  Allergen Reactions  . Atorvastatin     Myalgias    Medication list reviewed and updated in full in [facility].   GEN: No acute illnesses, no fevers, chills. GI: No n/v/d, eating normally Pulm: No SOB Interactive and getting along well at home.  Otherwise, ROS is as per the HPI.  Objective:   BP 120/70   Pulse 84   Temp 98.4 F (36.9 C) (Oral)   Ht 5\' 9"  (1.753 m)   Wt 192 lb 8 oz (87.3 kg)   BMI 28.43 kg/m   GEN: WDWN, NAD, Non-toxic, A & O x 3 HEENT: Atraumatic, Normocephalic. Neck supple. No masses, No LAD. Ears and Nose: No external deformity. CV: RRR, No M/G/R. No JVD. No thrill. No extra heart sounds. PULM: CTA B, no wheezes, crackles, rhonchi. No retractions. No resp. distress. No accessory muscle use. EXTR: No c/c/e NEURO Normal gait.  PSYCH: Normally interactive. Conversant. Not depressed or anxious appearing.  Calm demeanor.  MSK: classic L trigger finger, pointer GU: retractable foreskin with some mild swelling, no redness or warmth  Laboratory and Imaging Data:  Assessment and Plan:   Trigger finger, left index finger - Plan: methylPREDNISolone acetate (DEPO-MEDROL) injection 20 mg  Balanitis  C/w diflucan, incompletely treated balanitis  Trigger Finger Injection, L 2nd Date of procedure: 09/19/2018 Verbal consent was obtained. Risks (including rare risk of infection, potential risk for skin lightening and potential atrophy), benefits and alternatives were discussed. Prepped with Chloraprep and Ethyl Chloride used for anesthesia.  Under sterile conditions, patient injected at palmar crease aiming distally with 45 degree angle towards nodule; injected directly into tendon sheath. Medication flowed freely without resistance.  Needle size: 22 gauge 1 1/2 inch Injection: 1/2 cc of Lidocaine 1% and Depo-Medrol 20 mg Medication: Depo-Medrol 20 mg   Follow-up: prn  Meds ordered this encounter  Medications  . methylPREDNISolone acetate (DEPO-MEDROL) injection 20 mg   No orders of the defined types were placed in this encounter.   Signed,  Elpidio Galea. Robbin Escher,  MD   Outpatient Encounter Medications as of 09/19/2018  Medication Sig  . Ascorbic Acid (VITAMIN C) 1000 MG tablet Take 1,000 mg by mouth daily.  Marland Kitchen aspirin EC 81 MG tablet Take 81 mg by mouth daily.  . cholecalciferol (VITAMIN D) 1000 UNITS tablet Take 1,000 Units by mouth daily.    Marland Kitchen docusate sodium (COLACE) 100 MG capsule Take 100 mg by mouth 2 (two) times daily.  Marland Kitchen esomeprazole (NEXIUM) 40 MG capsule Take 40 mg by mouth daily at 12 noon.  . ezetimibe (ZETIA) 10 MG tablet Take 1 tablet (10 mg total) by mouth daily.  . fluconazole (DIFLUCAN) 150 MG tablet 1 tab po today, then repeat 2nd dose in 7 days  . fluticasone (FLONASE) 50 MCG/ACT nasal spray PLACE 1 SPRAY INTO BOTH NOSTRILS TWICE DAILY  . folic acid (FOLVITE) 400 MCG tablet Take 400 mcg by mouth daily.  Marland Kitchen levocetirizine (XYZAL) 5 MG tablet TAKE 1 TABLET BY MOUTH ONCE EVERY EVENING FOR ALLERGIES AND RASH  . lisinopril-hydrochlorothiazide (PRINZIDE,ZESTORETIC) 10-12.5 MG tablet Take 0.5 tablets by mouth daily.  . Omega-3 Fatty Acids (FISH OIL PO) Take 2,400 mg by mouth daily.   . potassium chloride (K-DUR) 10 MEQ tablet Take 1 tablet (10 mEq total) by mouth 2 (two) times daily.  . pravastatin (PRAVACHOL) 20 MG tablet Take 1 tablet (20 mg total) by mouth daily.   Facility-Administered Encounter Medications as of 09/19/2018  Medication  . 0.9 %  sodium chloride infusion  . [COMPLETED] methylPREDNISolone  acetate (DEPO-MEDROL) injection 20 mg

## 2018-09-19 ENCOUNTER — Ambulatory Visit (INDEPENDENT_AMBULATORY_CARE_PROVIDER_SITE_OTHER): Payer: PPO | Admitting: Family Medicine

## 2018-09-19 ENCOUNTER — Encounter: Payer: Self-pay | Admitting: Family Medicine

## 2018-09-19 ENCOUNTER — Ambulatory Visit: Payer: PPO | Admitting: Family Medicine

## 2018-09-19 VITALS — BP 120/70 | HR 84 | Temp 98.4°F | Ht 69.0 in | Wt 192.5 lb

## 2018-09-19 DIAGNOSIS — N481 Balanitis: Secondary | ICD-10-CM

## 2018-09-19 DIAGNOSIS — M65322 Trigger finger, left index finger: Secondary | ICD-10-CM

## 2018-09-19 MED ORDER — METHYLPREDNISOLONE ACETATE 40 MG/ML IJ SUSP
20.0000 mg | Freq: Once | INTRAMUSCULAR | Status: AC
  Administered 2018-09-19: 20 mg via INTRA_ARTICULAR

## 2018-10-13 ENCOUNTER — Encounter: Payer: Self-pay | Admitting: Family Medicine

## 2018-10-16 ENCOUNTER — Other Ambulatory Visit: Payer: Self-pay | Admitting: Family Medicine

## 2018-10-16 MED ORDER — FLUCONAZOLE 150 MG PO TABS: ORAL_TABLET | ORAL | 0 refills | Status: DC

## 2018-11-05 ENCOUNTER — Other Ambulatory Visit (INDEPENDENT_AMBULATORY_CARE_PROVIDER_SITE_OTHER): Payer: PPO

## 2018-11-05 DIAGNOSIS — D518 Other vitamin B12 deficiency anemias: Secondary | ICD-10-CM | POA: Diagnosis not present

## 2018-11-05 DIAGNOSIS — E785 Hyperlipidemia, unspecified: Secondary | ICD-10-CM | POA: Diagnosis not present

## 2018-11-05 DIAGNOSIS — I10 Essential (primary) hypertension: Secondary | ICD-10-CM

## 2018-11-05 DIAGNOSIS — Z125 Encounter for screening for malignant neoplasm of prostate: Secondary | ICD-10-CM

## 2018-11-05 DIAGNOSIS — E119 Type 2 diabetes mellitus without complications: Secondary | ICD-10-CM | POA: Diagnosis not present

## 2018-11-05 LAB — HEPATIC FUNCTION PANEL
ALT: 37 U/L (ref 0–53)
AST: 17 U/L (ref 0–37)
Albumin: 4.5 g/dL (ref 3.5–5.2)
Alkaline Phosphatase: 40 U/L (ref 39–117)
Bilirubin, Direct: 0.1 mg/dL (ref 0.0–0.3)
Total Bilirubin: 0.9 mg/dL (ref 0.2–1.2)
Total Protein: 6.3 g/dL (ref 6.0–8.3)

## 2018-11-05 LAB — BASIC METABOLIC PANEL
BUN: 14 mg/dL (ref 6–23)
CO2: 30 mEq/L (ref 19–32)
Calcium: 9.7 mg/dL (ref 8.4–10.5)
Chloride: 103 mEq/L (ref 96–112)
Creatinine, Ser: 0.88 mg/dL (ref 0.40–1.50)
GFR: 86.8 mL/min (ref >60.00)
Glucose, Bld: 149 mg/dL — ABNORMAL HIGH (ref 70–99)
Potassium: 4.9 mEq/L (ref 3.5–5.1)
Sodium: 140 mEq/L (ref 135–145)

## 2018-11-05 LAB — LIPID PANEL
Cholesterol: 195 mg/dL (ref 0–200)
HDL: 39.2 mg/dL (ref >39.00)
NonHDL: 155.59
Total CHOL/HDL Ratio: 5
Triglycerides: 250 mg/dL — ABNORMAL HIGH (ref 0.0–149.0)
VLDL: 50 mg/dL — ABNORMAL HIGH (ref 0.0–40.0)

## 2018-11-05 LAB — VITAMIN B12: Vitamin B-12: 451 pg/mL (ref 211–911)

## 2018-11-05 LAB — HEMOGLOBIN A1C: Hgb A1c MFr Bld: 8.6 % — ABNORMAL HIGH (ref 4.6–6.5)

## 2018-11-05 LAB — PSA, MEDICARE: PSA: 0.56 ng/ml (ref 0.10–4.00)

## 2018-11-05 LAB — LDL CHOLESTEROL, DIRECT: Direct LDL: 121 mg/dL

## 2018-11-12 ENCOUNTER — Encounter: Payer: Self-pay | Admitting: Family Medicine

## 2018-11-18 ENCOUNTER — Encounter: Payer: Self-pay | Admitting: Family Medicine

## 2018-11-18 ENCOUNTER — Ambulatory Visit (INDEPENDENT_AMBULATORY_CARE_PROVIDER_SITE_OTHER): Payer: PPO | Admitting: Family Medicine

## 2018-11-18 DIAGNOSIS — F528 Other sexual dysfunction not due to a substance or known physiological condition: Secondary | ICD-10-CM | POA: Diagnosis not present

## 2018-11-18 DIAGNOSIS — E119 Type 2 diabetes mellitus without complications: Secondary | ICD-10-CM | POA: Diagnosis not present

## 2018-11-18 DIAGNOSIS — Z0000 Encounter for general adult medical examination without abnormal findings: Secondary | ICD-10-CM | POA: Diagnosis not present

## 2018-11-18 DIAGNOSIS — K219 Gastro-esophageal reflux disease without esophagitis: Secondary | ICD-10-CM

## 2018-11-18 DIAGNOSIS — Z7189 Other specified counseling: Secondary | ICD-10-CM

## 2018-11-18 DIAGNOSIS — J302 Other seasonal allergic rhinitis: Secondary | ICD-10-CM

## 2018-11-18 DIAGNOSIS — E785 Hyperlipidemia, unspecified: Secondary | ICD-10-CM

## 2018-11-18 DIAGNOSIS — D518 Other vitamin B12 deficiency anemias: Secondary | ICD-10-CM | POA: Diagnosis not present

## 2018-11-18 DIAGNOSIS — I10 Essential (primary) hypertension: Secondary | ICD-10-CM | POA: Diagnosis not present

## 2018-11-18 MED ORDER — EZETIMIBE 10 MG PO TABS: 10.0000 mg | ORAL_TABLET | Freq: Every day | ORAL | 3 refills | Status: DC

## 2018-11-18 MED ORDER — VITAMIN B-12 1000 MCG PO TABS: 1000.0000 ug | ORAL_TABLET | Freq: Every day | ORAL | Status: DC

## 2018-11-18 MED ORDER — ESOMEPRAZOLE MAGNESIUM 40 MG PO CPDR: 40.0000 mg | DELAYED_RELEASE_CAPSULE | Freq: Every day | ORAL | 3 refills | Status: DC

## 2018-11-18 MED ORDER — PRAVASTATIN SODIUM 20 MG PO TABS: 20.0000 mg | ORAL_TABLET | Freq: Every day | ORAL | 3 refills | Status: DC

## 2018-11-18 MED ORDER — FLUTICASONE PROPIONATE 50 MCG/ACT NA SUSP: NASAL | 3 refills | Status: DC

## 2018-11-18 MED ORDER — LISINOPRIL-HYDROCHLOROTHIAZIDE 10-12.5 MG PO TABS: 0.5000 | ORAL_TABLET | Freq: Every day | ORAL | 3 refills | Status: DC

## 2018-11-18 MED ORDER — POTASSIUM CHLORIDE ER 10 MEQ PO TBCR: 10.0000 meq | EXTENDED_RELEASE_TABLET | Freq: Two times a day (BID) | ORAL | 3 refills | Status: DC

## 2018-11-18 NOTE — Patient Instructions (Addendum)
Let us know what meter is covered.  Recheck A1c in about 3 months at a visit.  Keep working on diet and exercise.  Restart 1049mcg B12 daily.  That is OTC.  Take care.  Glad to see you.  Update me as needed.

## 2018-11-18 NOTE — Progress Notes (Signed)
I have personally reviewed the Medicare Annual Wellness questionnaire and have noted 1. The patient's medical and social history 2. Their use of alcohol, tobacco or illicit drugs 3. Their current medications and supplements 4. The patient's functional ability including ADL's, fall risks, home safety risks and hearing or visual             impairment. 5. Diet and physical activities 6. Evidence for depression or mood disorders  The patients weight, height, BMI have been recorded in the chart and visual acuity is per eye clinic.  I have made referrals, counseling and provided education to the patient based review of the above and I have provided the pt with a written personalized care plan for preventive services.  Provider list updated- see scanned forms.  Routine anticipatory guidance given to patient.  See health maintenance. The possibility exists that previously documented standard health maintenance information may have been brought forward from a previous encounter into this note.  If needed, that same information has been updated to reflect the current situation based on today's encounter.    Flu 2019 Shingles up-to-date PNA up-to-date Tetanus 2013 Colonoscopy 2018 Prostate cancer screening 2020 Advance directive-wife designated if patient were incapacitated. Cognitive function addressed- see scanned forms- and if abnormal then additional documentation follows.   Baseline EKG in EMR, reviewed.  Hearing screening passed with whisper test but has tinnitus at baseline. He is putting up with with tinnitus.  He uses the fan to mask it some.    Elevated Cholesterol: Using medications without problems: yes, but see below Muscle aches: some occ cramping but tolerable.  He doesn't have claudication Diet compliance: encouraged Exercise:encouraged  GERD controlled with PPI.   No adverse effect on medication.  Hypertension:   Using medication without problems or lightheadedness:  yes Chest pain with exertion:no Edema:no Short of breath:no Labs d/w pt.   Skin changes. No itching, no burning, but has whitish spots on shaft and tip of the penis.    ED. using over-the-counter appliance (a "Giddy") and this seems reasonable to continue.  Discussed.  Diabetes:  No meds.  Hypoglycemic episodes:no Hyperglycemic episodes:no Feet problems:no Blood Sugars averaging: not checked.   eye exam within last year: yes A1c up.   B12 lower but still normal.  Had been on 52mcg a day, d/w pt about increasing back to 1077mcg a day.    Excision site on R side of neck.  Appears well healed without any abnormal findings, only old scar that appears well healed.   PMH and SH reviewed  Meds, vitals, and allergies reviewed.   ROS: Per HPI.  Unless specifically indicated otherwise in HPI, the patient denies:  General: fever. Eyes: acute vision changes ENT: sore throat Cardiovascular: chest pain Respiratory: SOB GI: vomiting GU: dysuria Musculoskeletal: acute back pain Derm: acute rash Neuro: acute motor dysfunction Psych: worsening mood Endocrine: polydipsia Heme: bleeding Allergy: hayfever  GEN: nad, alert and oriented HEENT: mucous membranes moist NECK: supple w/o LA CV: rrr. PULM: ctab, no inc wob ABD: soft, +bs EXT: no edema SKIN: no acute rash, excision site on the right side of the neck appears to be well-healed without any residual lesion. Groin exam normal with the exception of some very minimal dry skin on the foreskin.  Diabetic foot exam: Normal inspection No skin breakdown No calluses  Normal DP pulses Normal sensation to light touch and monofilament Nails normal

## 2018-11-20 NOTE — Assessment & Plan Note (Signed)
No change in meds.  Continue work on diet and exercise.  He agrees.  Labs discussed with patient.

## 2018-11-20 NOTE — Assessment & Plan Note (Signed)
Advance directive- wife designated if patient were incapacitated.  

## 2018-11-20 NOTE — Assessment & Plan Note (Signed)
Flu 2019 Shingles up-to-date PNA up-to-date Tetanus 2013 Colonoscopy 2018 Prostate cancer screening 2020 Advance directive-wife designated if patient were incapacitated. Cognitive function addressed- see scanned forms- and if abnormal then additional documentation follows.

## 2018-11-20 NOTE — Assessment & Plan Note (Signed)
He can check on getting a meter. He can let us know what meter is covered.  Recheck A1c in about 3 months at a visit.  Keep working on diet and exercise.  He has been able to get A1c lower with diet and exercise in the past.  He agrees with plan.

## 2018-11-20 NOTE — Assessment & Plan Note (Signed)
Reasonable to continue using over-the-counter device as needed.  He has some very mild dry skin on the foreskin and this seems like a separate issue that does not need treatment otherwise.

## 2018-11-20 NOTE — Assessment & Plan Note (Signed)
B12 is lower than previous.  Had been on 500 mcg a day.  Discussed with patient about increasing back to 1000 mcg a day.

## 2018-11-20 NOTE — Assessment & Plan Note (Signed)
GERD controlled with PPI.   Continue as is.  He agrees.

## 2018-11-21 ENCOUNTER — Other Ambulatory Visit: Payer: Self-pay | Admitting: Family Medicine

## 2018-11-21 DIAGNOSIS — J302 Other seasonal allergic rhinitis: Secondary | ICD-10-CM

## 2018-11-26 ENCOUNTER — Encounter: Payer: Self-pay | Admitting: Family Medicine

## 2018-11-27 ENCOUNTER — Telehealth: Payer: Self-pay | Admitting: Family Medicine

## 2018-11-27 NOTE — Telephone Encounter (Signed)
Insurance will cover One Touch Blood Sugar Meter. Please send this and with strips and lancets. Check sugar daily as needed.  Diagnosis E 11.9. Thanks.

## 2018-11-28 ENCOUNTER — Other Ambulatory Visit: Payer: Self-pay | Admitting: *Deleted

## 2018-11-28 MED ORDER — ONETOUCH ULTRA 2 W/DEVICE KIT: PACK | 0 refills | Status: AC

## 2018-11-28 MED ORDER — ONETOUCH ULTRASOFT LANCETS MISC: 3 refills | Status: AC

## 2018-11-28 MED ORDER — GLUCOSE BLOOD VI STRP: ORAL_STRIP | 3 refills | Status: DC

## 2018-11-28 NOTE — Telephone Encounter (Signed)
Sent to [employer].

## 2019-02-02 ENCOUNTER — Encounter: Payer: Self-pay | Admitting: Family Medicine

## 2019-02-06 ENCOUNTER — Other Ambulatory Visit: Payer: Self-pay

## 2019-02-06 ENCOUNTER — Encounter: Payer: Self-pay | Admitting: Family Medicine

## 2019-02-06 ENCOUNTER — Ambulatory Visit (INDEPENDENT_AMBULATORY_CARE_PROVIDER_SITE_OTHER): Payer: PPO | Admitting: Family Medicine

## 2019-02-06 VITALS — BP 138/82 | HR 79 | Temp 98.0°F | Wt 189.8 lb

## 2019-02-06 DIAGNOSIS — E119 Type 2 diabetes mellitus without complications: Secondary | ICD-10-CM

## 2019-02-06 DIAGNOSIS — L989 Disorder of the skin and subcutaneous tissue, unspecified: Secondary | ICD-10-CM

## 2019-02-06 LAB — POCT GLYCOSYLATED HEMOGLOBIN (HGB A1C): Hemoglobin A1C: 8 % — AB (ref 4.0–5.6)

## 2019-02-06 MED ORDER — SULFAMETHOXAZOLE-TRIMETHOPRIM 400-80 MG PO TABS: 1.0000 | ORAL_TABLET | Freq: Two times a day (BID) | ORAL | 0 refills | Status: DC

## 2019-02-06 NOTE — Progress Notes (Signed)
Pandemic considerations d/w pt.  No covid sx.    Lesion just to L of scrotum.  Noted to be worse in the last week or so, but was present prior.  No FCNAVD.  He had used corn starch on it.  Using neosporin on it. Prev ruptured and drained some creamy fluid, after "it had festered up."  He doesn't feel sick o/w.  No burning with urination.  Tender locally.    Diabetes:  No meds Hypoglycemic episodes: no Hyperglycemic episodes: no Feet problems: no Blood Sugars averaging: usually ~ 140-160s A1c pending.  He is working on diet.    Meds, vitals, and allergies reviewed.   ROS: Per HPI unless specifically indicated in ROS section   nad ~1cm lesion on the L groin, with mildly purulent vs serosanguinous drainage.  ttp but no spreading erythema.   Scrotum wnl, testicles not ttp.

## 2019-02-06 NOTE — Assessment & Plan Note (Signed)
See notes on A1c. ?

## 2019-02-06 NOTE — Patient Instructions (Signed)
Start septra, use warm compresses, update me as needed.  Go to the lab on the way out.  We'll contact you with your lab report. Take care.  Glad to see you.  Cancel the upcoming appointment.

## 2019-02-06 NOTE — Assessment & Plan Note (Signed)
Could have been a small cyst vs infected hair follicle initially.  At this point, still draining, no need for I&D.  Continue warm compresses as he has been doing.  Start septra.  Update me as needed. He agrees.

## 2019-02-11 ENCOUNTER — Encounter: Payer: Self-pay | Admitting: Family Medicine

## 2019-02-20 ENCOUNTER — Ambulatory Visit: Payer: PPO | Admitting: Family Medicine

## 2019-04-14 LAB — HM DIABETES EYE EXAM

## 2019-05-04 ENCOUNTER — Encounter: Payer: Self-pay | Admitting: Family Medicine

## 2019-05-08 ENCOUNTER — Encounter: Payer: Self-pay | Admitting: Family Medicine

## 2019-05-08 ENCOUNTER — Ambulatory Visit (INDEPENDENT_AMBULATORY_CARE_PROVIDER_SITE_OTHER): Payer: PPO | Admitting: Family Medicine

## 2019-05-08 ENCOUNTER — Other Ambulatory Visit: Payer: Self-pay

## 2019-05-08 VITALS — BP 128/78 | HR 76 | Temp 98.8°F | Ht 69.0 in | Wt 189.1 lb

## 2019-05-08 DIAGNOSIS — Z23 Encounter for immunization: Secondary | ICD-10-CM | POA: Diagnosis not present

## 2019-05-08 DIAGNOSIS — H9319 Tinnitus, unspecified ear: Secondary | ICD-10-CM

## 2019-05-08 DIAGNOSIS — E119 Type 2 diabetes mellitus without complications: Secondary | ICD-10-CM | POA: Diagnosis not present

## 2019-05-08 DIAGNOSIS — L989 Disorder of the skin and subcutaneous tissue, unspecified: Secondary | ICD-10-CM | POA: Diagnosis not present

## 2019-05-08 LAB — POCT GLYCOSYLATED HEMOGLOBIN (HGB A1C): Hemoglobin A1C: 8.5 % — AB (ref 4.0–5.6)

## 2019-05-08 MED ORDER — METFORMIN HCL 500 MG PO TABS: 500.0000 mg | ORAL_TABLET | Freq: Two times a day (BID) | ORAL | 3 refills | Status: DC

## 2019-05-08 NOTE — Patient Instructions (Addendum)
If the spot in your groin gets worse then let me know.  Keep the spot on your neck clean and covered.  It should blister and then heal over.  If it doesn't heal over smoothly, then let me know.  I can check it next time you come in for a visit.  Start metformin once a day.  If tolerated for about 2 weeks, then try taking it twice a day.  See what you can do with diet.  Keep moving.  Recheck in about 4 months, assuming that is feasible at the time.  Take care.  Glad to see you.

## 2019-05-08 NOTE — Progress Notes (Signed)
Diabetes:  No meds.   Hypoglycemic episodes: no Hyperglycemic episodes:no Feet problems:no Blood Sugars averaging: 170-180 recently.   eye exam within last year: yes A1c 8---->8.5 today.   He has ENT appointment pending for tinnitus.  I'll defer and await ENT notes.    1cm irritated flat area on the R side of the neck.  At prev [facility] site.  Had prev healed on exam.    Prev skin lesion in groin with occ minimal drainage expressed by patient.  It is clearly smaller than prev.  No pain.  "I feel good a majority of the time."  He is doing yard work.  Pandemic considerations d/w pt.    Meds, vitals, and allergies reviewed.   ROS: Per HPI unless specifically indicated in ROS section   GEN: nad, alert and oriented HEENT: mucous membranes moist, TM wnl B, minimal cerumen B.   NECK: supple w/o LA CV: rrr. PULM: ctab, no inc wob ABD: soft, +bs EXT: no edema SKIN: 1 cm irritated area w/o ulceration at prev BCC on R side of neck.  Treated x3 with liq N2 w/o complication.   L groin w/o redness or mass noted.  No drainage.

## 2019-05-11 DIAGNOSIS — H9319 Tinnitus, unspecified ear: Secondary | ICD-10-CM | POA: Insufficient documentation

## 2019-05-11 NOTE — Assessment & Plan Note (Signed)
He has ENT appointment pending for tinnitus.  I'll defer and await ENT notes.

## 2019-05-11 NOTE — Assessment & Plan Note (Signed)
2 separate issues. 1 cm irritated area w/o ulceration at prev BCC on R side of neck.  Treated x3 with liq N2 w/o complication.   See after visit summary.  Update me as needed.  If this were a recurrence of basal cell carcinoma then this should address the issue.  We can always retreat if needed.  Discussed with patient.  He agrees.  L groin w/o redness or mass noted.  No drainage.  Observe for now.  He agrees.

## 2019-05-11 NOTE — Assessment & Plan Note (Signed)
A1c 8---->8.5 today.   Start metformin daily.  If tolerated then increase to twice a day.  Recheck in about 4 months.  Diet discussed with patient.  See after visit summary.  He agrees.

## 2019-05-27 ENCOUNTER — Other Ambulatory Visit: Payer: Self-pay | Admitting: Physician Assistant

## 2019-05-27 ENCOUNTER — Other Ambulatory Visit (HOSPITAL_COMMUNITY): Payer: Self-pay | Admitting: Physician Assistant

## 2019-05-27 DIAGNOSIS — H90A22 Sensorineural hearing loss, unilateral, left ear, with restricted hearing on the contralateral side: Secondary | ICD-10-CM

## 2019-05-27 DIAGNOSIS — H9313 Tinnitus, bilateral: Secondary | ICD-10-CM | POA: Diagnosis not present

## 2019-05-27 DIAGNOSIS — H903 Sensorineural hearing loss, bilateral: Secondary | ICD-10-CM | POA: Diagnosis not present

## 2019-06-03 ENCOUNTER — Ambulatory Visit: Payer: PPO

## 2019-07-03 DIAGNOSIS — H903 Sensorineural hearing loss, bilateral: Secondary | ICD-10-CM | POA: Diagnosis not present

## 2019-08-07 ENCOUNTER — Other Ambulatory Visit: Payer: Self-pay | Admitting: Family Medicine

## 2019-09-08 ENCOUNTER — Other Ambulatory Visit: Payer: Self-pay

## 2019-09-08 ENCOUNTER — Encounter: Payer: Self-pay | Admitting: Family Medicine

## 2019-09-08 ENCOUNTER — Ambulatory Visit (INDEPENDENT_AMBULATORY_CARE_PROVIDER_SITE_OTHER): Payer: PPO | Admitting: Family Medicine

## 2019-09-08 VITALS — BP 142/76 | HR 76 | Temp 97.5°F | Ht 69.0 in | Wt 186.6 lb

## 2019-09-08 DIAGNOSIS — E119 Type 2 diabetes mellitus without complications: Secondary | ICD-10-CM | POA: Diagnosis not present

## 2019-09-08 LAB — POCT GLYCOSYLATED HEMOGLOBIN (HGB A1C): Hemoglobin A1C: 7.1 % — AB (ref 4.0–5.6)

## 2019-09-08 NOTE — Patient Instructions (Addendum)
Your A1c was good.  Thanks for your effort.  Recheck in about 4 months with labs prior to a yearly visit.  Take care.  Glad to see you.

## 2019-09-08 NOTE — Progress Notes (Signed)
This visit occurred during the SARS-CoV-2 public health emergency.  Safety protocols were in place, including screening questions prior to the visit, additional usage of staff PPE, and extensive cleaning of exam room while observing appropriate contact time as indicated for disinfecting solutions.  Diabetes:  Using medications without difficulties: yes Hypoglycemic episodes: no Hyperglycemic episodes:no Feet problems:no Blood Sugars averaging: usually ~110-140 eye exam within last year: yes  Meds, vitals, and allergies reviewed.  ROS: Per HPI unless specifically indicated in ROS section   GEN: nad, alert and oriented HEENT: ncat NECK: supple w/o LA CV: rrr. PULM: ctab, no inc wob ABD: soft, +bs EXT: no edema SKIN: well perfused.

## 2019-09-08 NOTE — Assessment & Plan Note (Signed)
A1c improved w/o ADE on med.  Continue as is.  Recheck in about 3-4 months.  See avs.  D/w pt.  He agrees.

## 2019-10-12 DIAGNOSIS — U071 COVID-19: Secondary | ICD-10-CM | POA: Diagnosis not present

## 2019-10-13 ENCOUNTER — Ambulatory Visit (INDEPENDENT_AMBULATORY_CARE_PROVIDER_SITE_OTHER): Payer: PPO | Admitting: Family Medicine

## 2019-10-13 ENCOUNTER — Telehealth: Payer: Self-pay | Admitting: *Deleted

## 2019-10-13 ENCOUNTER — Encounter: Payer: Self-pay | Admitting: Family Medicine

## 2019-10-13 VITALS — Temp 97.7°F | Ht 72.0 in | Wt 180.0 lb

## 2019-10-13 DIAGNOSIS — U071 COVID-19: Secondary | ICD-10-CM

## 2019-10-13 NOTE — Telephone Encounter (Signed)
Noted  

## 2019-10-13 NOTE — Telephone Encounter (Signed)
Patient's wife called stating that he tested positive for covid yesterday at [place]. Mrs. Desantos stated that he has a lot of head and chest congestion. Katharine Look stated that at [place] he was told to treat his symptoms with OTC mediations. Virtual visit was scheduled with Tor Netters NP today at 2:00 pm. ER precautions were given to Mrs. Tieken and she verbalized understanding.

## 2019-10-13 NOTE — Progress Notes (Signed)
Virtual Visit via Video Note  I connected with Thomas Todd on 10/13/19 at  2:00 PM EST by a video enabled telemedicine application and verified that I am speaking with the correct person using two identifiers.  Location: Patient: At home Provider: [place] Persons participating in virtual visit: provider, patient, wife, NP student Saralyn Pilar   I discussed the limitations of evaluation and management by telemedicine and the availability of in person appointments. The patient expressed understanding and agreed to proceed.  History of Present Illness: Chief Complaint  Patient presents with  . Covid    Pt tested positive for Covid on 10/12/19 at [facility] in [place] (rapid test). Pt has been hving cough with light "mint green" mucous, chest congestion and little body aches. Using Mucinex OTC for sx's. Pt Denies ever having a fever. Pt states that he did in the beginning have Sinuns pressure/headache but this has since resolved. Pt states that this symptoms are "off and on". No sore throat.  Wife also tested positive - same symptoms, denies fever.   This is a 69 yo male who presents today for virtual visit. Started having symptoms 6 days ago- cough, small amount sputum production, headache, body aches. No fevers. Went to [place] and had positive rapid test. Wife also positive. History of DM, last hgba1c 7.1. Little nasal drainage, some congestion. Relief with ibuprofen 600 mg., mucinex.     Observations/Objective: Patient is alert and answers questions appropriately. Respirations even and unlabored, normally conversive, no audible wheeze, witnessed cough. Color normal. Mood and affect appropriate.   Temp 97.7 F (36.5 C) (Temporal)   Ht 6' (1.829 m)   Wt 180 lb (81.6 kg)   BMI 24.41 kg/m  Wt Readings from Last 3 Encounters:  10/13/19 180 lb (81.6 kg)  09/08/19 186 lb 9 oz (84.6 kg)  05/08/19 189 lb 2 oz (85.8 kg)    Assessment and Plan: 1. COVID-19 - discussed  symptomatic treatment and ER precautions - MYCHART COVID-19 HOME MONITORING PROGRAM - Temperature monitoring; Future   Clarene Reamer, FNP-BC  [facility]  10/13/2019 2:32 PM   Follow Up Instructions:    I discussed the assessment and treatment plan with the patient. The patient was provided an opportunity to ask questions and all were answered. The patient agreed with the plan and demonstrated an understanding of the instructions.   The patient was advised to call back or seek an in-person evaluation if the symptoms worsen or if the condition fails to improve as anticipated.    Elby Beck, FNP

## 2019-11-03 ENCOUNTER — Other Ambulatory Visit: Payer: Self-pay | Admitting: Family Medicine

## 2019-11-03 DIAGNOSIS — E785 Hyperlipidemia, unspecified: Secondary | ICD-10-CM

## 2019-11-04 ENCOUNTER — Other Ambulatory Visit: Payer: Self-pay | Admitting: *Deleted

## 2019-11-04 MED ORDER — GLUCOSE BLOOD VI STRP: ORAL_STRIP | 3 refills | Status: DC

## 2019-12-01 ENCOUNTER — Other Ambulatory Visit: Payer: Self-pay | Admitting: Family Medicine

## 2019-12-01 DIAGNOSIS — J302 Other seasonal allergic rhinitis: Secondary | ICD-10-CM

## 2019-12-25 ENCOUNTER — Other Ambulatory Visit: Payer: Self-pay | Admitting: Family Medicine

## 2019-12-25 ENCOUNTER — Telehealth: Payer: Self-pay | Admitting: Family Medicine

## 2019-12-25 DIAGNOSIS — D518 Other vitamin B12 deficiency anemias: Secondary | ICD-10-CM

## 2019-12-25 DIAGNOSIS — Z125 Encounter for screening for malignant neoplasm of prostate: Secondary | ICD-10-CM

## 2019-12-25 DIAGNOSIS — E559 Vitamin D deficiency, unspecified: Secondary | ICD-10-CM

## 2019-12-25 DIAGNOSIS — E119 Type 2 diabetes mellitus without complications: Secondary | ICD-10-CM

## 2019-12-25 NOTE — Telephone Encounter (Signed)
Left message for patient to call back and schedule Medicare Annual Wellness Visit (AWV) either virtually or audio only.  Labs and CPE is already scheduled. Offered 4/12 or 4/13 on voicemail   Welcome to [facility] was 11/18/2018

## 2020-01-01 ENCOUNTER — Other Ambulatory Visit (INDEPENDENT_AMBULATORY_CARE_PROVIDER_SITE_OTHER): Payer: PPO

## 2020-01-01 ENCOUNTER — Ambulatory Visit (INDEPENDENT_AMBULATORY_CARE_PROVIDER_SITE_OTHER): Payer: PPO

## 2020-01-01 ENCOUNTER — Other Ambulatory Visit: Payer: Self-pay

## 2020-01-01 VITALS — Wt 186.5 lb

## 2020-01-01 DIAGNOSIS — Z0000 Encounter for general adult medical examination without abnormal findings: Secondary | ICD-10-CM | POA: Diagnosis not present

## 2020-01-01 DIAGNOSIS — Z125 Encounter for screening for malignant neoplasm of prostate: Secondary | ICD-10-CM

## 2020-01-01 DIAGNOSIS — E119 Type 2 diabetes mellitus without complications: Secondary | ICD-10-CM

## 2020-01-01 DIAGNOSIS — D518 Other vitamin B12 deficiency anemias: Secondary | ICD-10-CM | POA: Diagnosis not present

## 2020-01-01 DIAGNOSIS — E559 Vitamin D deficiency, unspecified: Secondary | ICD-10-CM

## 2020-01-01 LAB — COMPREHENSIVE METABOLIC PANEL
ALT: 19 U/L (ref 0–53)
AST: 14 U/L (ref 0–37)
Albumin: 4.2 g/dL (ref 3.5–5.2)
Alkaline Phosphatase: 37 U/L — ABNORMAL LOW (ref 39–117)
BUN: 13 mg/dL (ref 6–23)
CO2: 31 mEq/L (ref 19–32)
Calcium: 9.2 mg/dL (ref 8.4–10.5)
Chloride: 106 mEq/L (ref 96–112)
Creatinine, Ser: 0.78 mg/dL (ref 0.40–1.50)
GFR: 99.41 mL/min (ref >60.00)
Glucose, Bld: 129 mg/dL — ABNORMAL HIGH (ref 70–99)
Potassium: 3.7 mEq/L (ref 3.5–5.1)
Sodium: 144 mEq/L (ref 135–145)
Total Bilirubin: 0.4 mg/dL (ref 0.2–1.2)
Total Protein: 5.6 g/dL — ABNORMAL LOW (ref 6.0–8.3)

## 2020-01-01 LAB — CBC WITH DIFFERENTIAL/PLATELET
Basophils Absolute: 0 10*3/uL (ref 0.0–0.1)
Basophils Relative: 0.6 % (ref 0.0–3.0)
Eosinophils Absolute: 0.2 10*3/uL (ref 0.0–0.7)
Eosinophils Relative: 2.6 % (ref 0.0–5.0)
HCT: 37.2 % — ABNORMAL LOW (ref 39.0–52.0)
Hemoglobin: 12.6 g/dL — ABNORMAL LOW (ref 13.0–17.0)
Lymphocytes Relative: 46.4 % — ABNORMAL HIGH (ref 12.0–46.0)
Lymphs Abs: 3.6 10*3/uL (ref 0.7–4.0)
MCHC: 34 g/dL (ref 30.0–36.0)
MCV: 85.4 fl (ref 78.0–100.0)
Monocytes Absolute: 0.7 10*3/uL (ref 0.1–1.0)
Monocytes Relative: 8.6 % (ref 3.0–12.0)
Neutro Abs: 3.2 10*3/uL (ref 1.4–7.7)
Neutrophils Relative %: 41.8 % — ABNORMAL LOW (ref 43.0–77.0)
Platelets: 186 10*3/uL (ref 150.0–400.0)
RBC: 4.36 Mil/uL (ref 4.22–5.81)
RDW: 13.8 % (ref 11.5–15.5)
WBC: 7.8 10*3/uL (ref 4.0–10.5)

## 2020-01-01 LAB — LIPID PANEL
Cholesterol: 143 mg/dL (ref 0–200)
HDL: 43.9 mg/dL (ref >39.00)
LDL Cholesterol: 75 mg/dL (ref 0–99)
NonHDL: 99.31
Total CHOL/HDL Ratio: 3
Triglycerides: 124 mg/dL (ref 0.0–149.0)
VLDL: 24.8 mg/dL (ref 0.0–40.0)

## 2020-01-01 LAB — VITAMIN D 25 HYDROXY (VIT D DEFICIENCY, FRACTURES): VITD: 23.07 ng/mL — ABNORMAL LOW (ref 30.00–100.00)

## 2020-01-01 LAB — PSA, MEDICARE: PSA: 0.64 ng/ml (ref 0.10–4.00)

## 2020-01-01 LAB — HEMOGLOBIN A1C: Hgb A1c MFr Bld: 6.6 % — ABNORMAL HIGH (ref 4.6–6.5)

## 2020-01-01 LAB — VITAMIN B12: Vitamin B-12: 181 pg/mL — ABNORMAL LOW (ref 211–911)

## 2020-01-01 NOTE — Patient Instructions (Signed)
[name] , Thank you for taking time to come for your Medicare Wellness Visit. I appreciate your ongoing commitment to your health goals. Please review the following plan we discussed and let me know if I can assist you in the future.   Screening recommendations/referrals: Colonoscopy: Up to date, completed 05/18/2017 Recommended yearly ophthalmology/optometry visit for glaucoma screening and checkup Recommended yearly dental visit for hygiene and checkup  Vaccinations: Influenza vaccine: Up to date, completed 05/08/2019 Pneumococcal vaccine: Completed series Tdap vaccine: Up to date, completed 02/13/2012 Shingles vaccine: Completed series    Advanced directives: Advance directive discussed with you today. I have provided a copy for you to complete at home and have notarized. Once this is complete please bring a copy in to our office so we can scan it into your chart.  Conditions/risks identified: Diabetes, Hypertension, Hyperlipidemia  Next appointment: 01/08/2020 @ 8:30 am   Preventive Care 67 Years and Older, Male Preventive care refers to lifestyle choices and visits with your health care provider that can promote health and wellness. What does preventive care include?  A yearly physical exam. This is also called an annual well check.  Dental exams once or twice a year.  Routine eye exams. Ask your health care provider how often you should have your eyes checked.  Personal lifestyle choices, including:  Daily care of your teeth and gums.  Regular physical activity.  Eating a healthy diet.  Avoiding tobacco and drug use.  Limiting alcohol use.  Practicing safe sex.  Taking low doses of aspirin every day.  Taking vitamin and mineral supplements as recommended by your health care provider. What happens during an annual well check? The services and screenings done by your health care provider during your annual well check will depend on your age, overall health,  lifestyle risk factors, and family history of disease. Counseling  Your health care provider may ask you questions about your:  Alcohol use.  Tobacco use.  Drug use.  Emotional well-being.  Home and relationship well-being.  Sexual activity.  Eating habits.  History of falls.  Memory and ability to understand (cognition).  Work and work Statistician. Screening  You may have the following tests or measurements:  Height, weight, and BMI.  Blood pressure.  Lipid and cholesterol levels. These may be checked every 5 years, or more frequently if you are over 26 years old.  Skin check.  Lung cancer screening. You may have this screening every year starting at age 43 if you have a 30-pack-year history of smoking and currently smoke or have quit within the past 15 years.  Fecal occult blood test (FOBT) of the stool. You may have this test every year starting at age 8.  Flexible sigmoidoscopy or colonoscopy. You may have a sigmoidoscopy every 5 years or a colonoscopy every 10 years starting at age 56.  Prostate cancer screening. Recommendations will vary depending on your family history and other risks.  Hepatitis C blood test.  Hepatitis B blood test.  Sexually transmitted disease (STD) testing.  Diabetes screening. This is done by checking your blood sugar (glucose) after you have not eaten for a while (fasting). You may have this done every 1-3 years.  Abdominal aortic aneurysm (AAA) screening. You may need this if you are a current or former smoker.  Osteoporosis. You may be screened starting at age 1 if you are at high risk. Talk with your health care provider about your test results, treatment options, and if necessary, the need  for more tests. Vaccines  Your health care provider may recommend certain vaccines, such as:  Influenza vaccine. This is recommended every year.  Tetanus, diphtheria, and acellular pertussis (Tdap, Td) vaccine. You may need a Td booster  every 10 years.  Zoster vaccine. You may need this after age 37.  Pneumococcal 13-valent conjugate (PCV13) vaccine. One dose is recommended after age 74.  Pneumococcal polysaccharide (PPSV23) vaccine. One dose is recommended after age 71. Talk to your health care provider about which screenings and vaccines you need and how often you need them. This information is not intended to replace advice given to you by your health care provider. Make sure you discuss any questions you have with your health care provider. Document Released: 10/01/2015 Document Revised: 05/24/2016 Document Reviewed: 07/06/2015 Elsevier Interactive Patient Education  2017 [place] Prevention in the Home Falls can cause injuries. They can happen to people of all ages. There are many things you can do to make your home safe and to help prevent falls. What can I do on the outside of my home?  Regularly fix the edges of walkways and driveways and fix any cracks.  Remove anything that might make you trip as you walk through a door, such as a raised step or threshold.  Trim any bushes or trees on the path to your home.  Use bright outdoor lighting.  Clear any walking paths of anything that might make someone trip, such as rocks or tools.  Regularly check to see if handrails are loose or broken. Make sure that both sides of any steps have handrails.  Any raised decks and porches should have guardrails on the edges.  Have any leaves, snow, or ice cleared regularly.  Use sand or salt on walking paths during winter.  Clean up any spills in your garage right away. This includes oil or grease spills. What can I do in the bathroom?  Use night lights.  Install grab bars by the toilet and in the tub and shower. Do not use towel bars as grab bars.  Use non-skid mats or decals in the tub or shower.  If you need to sit down in the shower, use a plastic, non-slip stool.  Keep the floor dry. Clean up any  water that spills on the floor as soon as it happens.  Remove soap buildup in the tub or shower regularly.  Attach bath mats securely with double-sided non-slip rug tape.  Do not have throw rugs and other things on the floor that can make you trip. What can I do in the bedroom?  Use night lights.  Make sure that you have a light by your bed that is easy to reach.  Do not use any sheets or blankets that are too big for your bed. They should not hang down onto the floor.  Have a firm chair that has side arms. You can use this for support while you get dressed.  Do not have throw rugs and other things on the floor that can make you trip. What can I do in the kitchen?  Clean up any spills right away.  Avoid walking on wet floors.  Keep items that you use a lot in easy-to-reach places.  If you need to reach something above you, use a strong step stool that has a grab bar.  Keep electrical cords out of the way.  Do not use floor polish or wax that makes floors slippery. If you must use wax, use  non-skid floor wax.  Do not have throw rugs and other things on the floor that can make you trip. What can I do with my stairs?  Do not leave any items on the stairs.  Make sure that there are handrails on both sides of the stairs and use them. Fix handrails that are broken or loose. Make sure that handrails are as long as the stairways.  Check any carpeting to make sure that it is firmly attached to the stairs. Fix any carpet that is loose or worn.  Avoid having throw rugs at the top or bottom of the stairs. If you do have throw rugs, attach them to the floor with carpet tape.  Make sure that you have a light switch at the top of the stairs and the bottom of the stairs. If you do not have them, ask someone to add them for you. What else can I do to help prevent falls?  Wear shoes that:  Do not have high heels.  Have rubber bottoms.  Are comfortable and fit you well.  Are closed  at the toe. Do not wear sandals.  If you use a stepladder:  Make sure that it is fully opened. Do not climb a closed stepladder.  Make sure that both sides of the stepladder are locked into place.  Ask someone to hold it for you, if possible.  Clearly mark and make sure that you can see:  Any grab bars or handrails.  First and last steps.  Where the edge of each step is.  Use tools that help you move around (mobility aids) if they are needed. These include:  Canes.  Walkers.  Scooters.  Crutches.  Turn on the lights when you go into a dark area. Replace any light bulbs as soon as they burn out.  Set up your furniture so you have a clear path. Avoid moving your furniture around.  If any of your floors are uneven, fix them.  If there are any pets around you, be aware of where they are.  Review your medicines with your doctor. Some medicines can make you feel dizzy. This can increase your chance of falling. Ask your doctor what other things that you can do to help prevent falls. This information is not intended to replace advice given to you by your health care provider. Make sure you discuss any questions you have with your health care provider. Document Released: 07/01/2009 Document Revised: 02/10/2016 Document Reviewed: 10/09/2014 Elsevier Interactive Patient Education  2017 [employer].

## 2020-01-01 NOTE — Progress Notes (Signed)
PCP notes:  Health Maintenance: Foot exam- due   Abnormal Screenings: none   Patient concerns: Patient states he has to concentrate very hard to urinate. Wants to discuss this with provider.    Nurse concerns: none   Next PCP appt: 01/08/2020 @ 8:30 am

## 2020-01-01 NOTE — Progress Notes (Signed)
Subjective:   Thomas Todd is a 69 y.o. male who presents for Medicare Annual/Subsequent preventive examination.  Review of Systems: N/A   This visit is being conducted through telemedicine via telephone at the nurse health advisor's home address due to the COVID-19 pandemic. This patient has given me verbal consent via doximity to conduct this visit, patient states they are participating from their home address. Patient and myself are on the telephone call. There is no referral for this visit. Some vital signs may be absent or patient reported.    Patient identification: identified by name, DOB, and current address   Cardiac Risk Factors include: advanced age (>88mn, >>45women);male gender;diabetes mellitus;hypertension;dyslipidemia     Objective:    Vitals: Wt 186 lb 8 oz (84.6 kg)   BMI 25.29 kg/m   Body mass index is 25.29 kg/m.  Advanced Directives 01/01/2020 05/04/2017 03/17/2014  Does Patient Have a Medical Advance Directive? No Yes Patient does not have advance directive  [place]  Would patient like information on creating a medical advance directive? Yes (MAU/Ambulatory/Procedural Areas - Information given) - -    Tobacco Social History   Tobacco Use  Smoking Status Former Smoker  . Years: 40.00  . Quit date: 09/17/2009  . Years since quitting: 10.2  Smokeless Tobacco Never Used  Tobacco Comment   vapor     Counseling given: Not Answered Comment: vapor   Clinical Intake:  Pre-visit preparation completed: Yes  Pain : No/denies pain     Nutritional Risks: None Diabetes: Yes CBG done?: No Did pt. bring in CBG monitor from home?: No  How often do you need to have someone help you when you read instructions, pamphlets, or other written materials from your doctor or pharmacy?: 1 - Never What is the last grade level you completed in school?: 9th  Interpreter Needed?: No  Information entered by ::  CJohnson, LPN  Past Medical History:  Diagnosis Date  . Allergy   . Anemia   . Arthritis   . Diabetes mellitus without complication ([place]    no medications taken 05-04-17  . GERD (gastroesophageal reflux disease)   . Hyperlipidemia   . Hypertension   . LBP (low back pain)    with sciatica   . Pancreatitis    Pancreatitis etoh 08/1998   Past Surgical History:  Procedure Laterality Date  . COLONOSCOPY    . dental implant    . LAMINECTOMY  1993   L5/S1  . SEPTOPLASTY  1970   Family History  Problem Relation Age of Onset  . Hypertension Mother   . Diabetes Mother   . Hyperlipidemia Mother   . Hypothyroidism Mother   . Dementia Mother   . Alcohol abuse Father   . Heart disease Father   . Heart attack Father   . Diabetes Sister   . Colon cancer Neg Hx   . Prostate cancer Neg Hx   . Esophageal cancer Neg Hx   . Stomach cancer Neg Hx   . Rectal cancer Neg Hx    Social History   Socioeconomic History  . Marital status: Married    Spouse name: Not on file  . Number of children: Not on file  . Years of education: Not on file  . Highest education level: Not on file  Occupational History  . Not on file  Tobacco Use  . Smoking status: Former Smoker    Years: 40.00  Quit date: 09/17/2009    Years since quitting: 10.2  . Smokeless tobacco: Never Used  . Tobacco comment: vapor  Substance and Sexual Activity  . 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 Drug use: No  . Sexual activity: Not on file  Other Topics Concern  . Not on file  Social History Narrative   Sober since 1999   Prev did [employer], worked for [employer]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, retired as of 2015   Married 1975   Prev had 1 daughter, was chronically ill and died July 26, 2016   Social Determinants of Health   Financial Resource Strain: Low Risk   . Difficulty of Paying Living Expenses: Not hard at all  Food Insecurity: No Food Insecurity  . Worried About Charity fundraiser in the Last Year:  Never true  . Ran Out of Food in the Last Year: Never true  Transportation Needs: No Transportation Needs  . Lack of Transportation (Medical): No  . Lack of Transportation (Non-Medical): No  Physical Activity: Inactive  . Days of Exercise per Week: 0 days  . Minutes of Exercise per Session: 0 min  Stress: No Stress Concern Present  . Feeling of Stress : Not at all  Social Connections:   . Frequency of Communication with Friends and Family:   . Frequency of Social Gatherings with Friends and Family:   . Attends Religious Services:   . Active Member of Clubs or Organizations:   . Attends Archivist Meetings:   Marland Kitchen Marital Status:     Outpatient Encounter Medications as of 01/01/2020  Medication Sig  . Ascorbic Acid (VITAMIN C) 1000 MG tablet Take 1,000 mg by mouth daily.  Marland Kitchen aspirin EC 81 MG tablet Take 81 mg by mouth daily.  . Blood Glucose Monitoring Suppl (ONE TOUCH ULTRA 2) w/Device KIT Use as instructed to check blood sugar daily as needed.  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  . cholecalciferol (VITAMIN D) 1000 UNITS tablet Take 1,000 Units by mouth daily.    Marland Kitchen docusate sodium (COLACE) 100 MG capsule Take 100 mg by mouth 2 (two) times daily.  Marland Kitchen esomeprazole (NEXIUM) 40 MG capsule TAKE 1 CAPSULE BY MOUTH DAILY AT 12 NOON  . ezetimibe (ZETIA) 10 MG tablet TAKE 1 TABLET BY MOUTH ONCE DAILY  . fluticasone (FLONASE) 50 MCG/ACT nasal spray PLACE 1 SPRAY INTO BOTH NOSTRILS TWICE DAILY  . folic acid (FOLVITE) 354 MCG tablet Take 400 mcg by mouth daily.  Marland Kitchen glucose blood (ONE TOUCH ULTRA TEST) test strip Use as instructed to check blood sugar daily as needed.  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  . Lancets (ONETOUCH ULTRASOFT) lancets Use as instructed to check blood sugar daily as needed.  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  Marland Kitchen levocetirizine (XYZAL) 5 MG tablet TAKE 1 TABLET BY MOUTH ONCE EVERY EVENING FOR ALLERGIES AND RASH  . lisinopril-hydrochlorothiazide  (PRINZIDE,ZESTORETIC) 10-12.5 MG tablet Take 0.5 tablets by mouth daily.  . metFORMIN (GLUCOPHAGE) 500 MG tablet Take 1 tablet (500 mg total) by mouth 2 (two) times daily with a meal.  . Omega-3 Fatty Acids (FISH OIL PO) Take 2,400 mg by mouth daily.   . potassium chloride (KLOR-CON) 10 MEQ tablet TAKE 1 TABLET BY MOUTH 2 TIMES DAILY  . pravastatin (PRAVACHOL) 20 MG tablet TAKE 1 TABLET BY MOUTH DAILY  . vitamin B-12 (CYANOCOBALAMIN) 1000 MCG tablet Take 1 tablet (1,000 mcg total) by mouth daily.  No facility-administered encounter medications on file as of 01/01/2020.    Activities of Daily Living In your present state of health, do you have any difficulty performing the following activities: 01/01/2020  Hearing? Y  Comment wears hearing aids  Vision? N  Difficulty concentrating or making decisions? N  Walking or climbing stairs? N  Dressing or bathing? N  Doing errands, shopping? N  Preparing Food and eating ? N  Using the Toilet? N  In the past six months, have you accidently leaked urine? N  Do you have problems with loss of bowel control? N  Managing your Medications? N  Managing your Finances? N  Housekeeping or managing your Housekeeping? N  Some recent data might be hidden    Patient Care Team: Tonia Ghent, MD as PCP - General (Family Medicine)   Assessment:   This is a routine wellness examination for Calyn.  Exercise Activities and Dietary recommendations Current Exercise Habits: The patient does not participate in regular exercise at present, Exercise limited by: None identified  Goals    . Patient Stated     01/01/2020, I will maintain and continue medications as prescribed.        Fall Risk Fall Risk  01/01/2020 07/16/2018 11/07/2016  Falls in the past year? 0 No No  Number falls in past yr: 0 - -  Injury with Fall? 0 - -  Risk for fall due to : Medication side effect - -  Follow up Falls prevention discussed;Falls evaluation completed - -   Is the  patient's home free of loose throw rugs in walkways, pet beds, electrical cords, etc?   yes      Grab bars in the bathroom? no      Handrails on the stairs?   yes      Adequate lighting?   yes  Timed Get Up and Go Performed: N/A  Depression Screen PHQ 2/9 Scores 01/01/2020 11/13/2017 11/07/2016  PHQ - 2 Score 0 0 0  PHQ- 9 Score 0 - -    Cognitive Function MMSE - Mini Mental State Exam 01/01/2020  Orientation to time 5  Orientation to Place 5  Registration 3  Attention/ Calculation 5  Recall 3  Language- repeat 1       Mini Cog  Mini-Cog screen was completed. Maximum score is 22. A value of 0 denotes this part of the MMSE was not completed or the patient failed this part of the Mini-Cog screening.  Immunization History  Administered Date(s) Administered  . Influenza Split 07/26/2012  . Influenza Whole 11/11/2009  . Influenza,inj,Quad PF,6+ Mos 07/30/2013, 07/03/2014, 06/28/2015, 06/15/2016, 07/05/2017, 06/03/2018, 05/08/2019  . Pneumococcal Conjugate-13 07/16/2018  . Pneumococcal Polysaccharide-23 01/26/2015  . Td 03/17/2002  . Tdap 02/13/2012  . Zoster 09/03/2014  . Zoster Recombinat (Shingrix) 01/23/2018, 03/25/2018    Qualifies for Shingles Vaccine: Completed series   Screening Tests Health Maintenance  Topic Date Due  . FOOT EXAM  11/18/2019  . PNA vac Low Risk Adult (2 of 2 - PPSV23) 01/26/2020  . HEMOGLOBIN A1C  03/08/2020  . OPHTHALMOLOGY EXAM  04/13/2020  . INFLUENZA VACCINE  04/18/2020  . COLONOSCOPY  05/18/2020  . TETANUS/TDAP  02/12/2022  . Hepatitis C Screening  Completed   Cancer Screenings: Lung: Low Dose CT Chest recommended if Age 88-80 years, 30 pack-year currently smoking OR have quit w/in 15 years. Patient does not qualify. Colorectal: completed 05/18/2017  Additional Screenings:  Hepatitis C Screening: 04/25/2016      Plan:  Patient will maintain and continue medications as prescribed.   I have personally reviewed and noted the  following in the patient's chart:   . Medical and social history . Use of alcohol, tobacco or illicit drugs  . Current medications and supplements . Functional ability and status . Nutritional status . Physical activity . Advanced directives . List of other physicians . Hospitalizations, surgeries, and ER visits in previous 12 months . Vitals . Screenings to include cognitive, depression, and falls . Referrals and appointments  In addition, I have reviewed and discussed with patient certain preventive protocols, quality metrics, and best practice recommendations. A written personalized care plan for preventive services as well as general preventive health recommendations were provided to patient.     Ehren, Berisha, LPN  [id number]/88/9338

## 2020-01-08 ENCOUNTER — Other Ambulatory Visit: Payer: Self-pay

## 2020-01-08 ENCOUNTER — Encounter: Payer: Self-pay | Admitting: Family Medicine

## 2020-01-08 ENCOUNTER — Ambulatory Visit (INDEPENDENT_AMBULATORY_CARE_PROVIDER_SITE_OTHER): Payer: PPO | Admitting: Family Medicine

## 2020-01-08 VITALS — BP 156/84 | HR 85 | Temp 97.7°F | Ht 72.0 in | Wt 188.2 lb

## 2020-01-08 DIAGNOSIS — Z7189 Other specified counseling: Secondary | ICD-10-CM

## 2020-01-08 DIAGNOSIS — D518 Other vitamin B12 deficiency anemias: Secondary | ICD-10-CM

## 2020-01-08 DIAGNOSIS — I10 Essential (primary) hypertension: Secondary | ICD-10-CM

## 2020-01-08 DIAGNOSIS — J302 Other seasonal allergic rhinitis: Secondary | ICD-10-CM

## 2020-01-08 DIAGNOSIS — E785 Hyperlipidemia, unspecified: Secondary | ICD-10-CM

## 2020-01-08 DIAGNOSIS — E559 Vitamin D deficiency, unspecified: Secondary | ICD-10-CM | POA: Diagnosis not present

## 2020-01-08 DIAGNOSIS — N400 Enlarged prostate without lower urinary tract symptoms: Secondary | ICD-10-CM

## 2020-01-08 DIAGNOSIS — Z0000 Encounter for general adult medical examination without abnormal findings: Secondary | ICD-10-CM

## 2020-01-08 DIAGNOSIS — E119 Type 2 diabetes mellitus without complications: Secondary | ICD-10-CM

## 2020-01-08 MED ORDER — METFORMIN HCL 500 MG PO TABS: 500.0000 mg | ORAL_TABLET | Freq: Two times a day (BID) | ORAL | 3 refills | Status: DC

## 2020-01-08 MED ORDER — VITAMIN B-12 1000 MCG PO TABS: 2000.0000 ug | ORAL_TABLET | Freq: Every day | ORAL | Status: DC

## 2020-01-08 MED ORDER — LISINOPRIL-HYDROCHLOROTHIAZIDE 10-12.5 MG PO TABS: 0.5000 | ORAL_TABLET | Freq: Every day | ORAL | 3 refills | Status: DC

## 2020-01-08 MED ORDER — VITAMIN D-3 25 MCG (1000 UT) PO CAPS: 2000.0000 [IU] | ORAL_CAPSULE | Freq: Every day | ORAL | Status: AC

## 2020-01-08 MED ORDER — LEVOCETIRIZINE DIHYDROCHLORIDE 5 MG PO TABS: 5.0000 mg | ORAL_TABLET | Freq: Every evening | ORAL | 3 refills | Status: DC

## 2020-01-08 NOTE — Patient Instructions (Addendum)
I would get a covid vaccine when possible.   Recheck labs in about 4 months prior to a visit.  Take care.  Glad to see you. Take a higher dose of B12 and vitamin D.  Update me as needed.    Check your BP a few times out of clinic and update me if persistently >140/>90.  If you have any trouble with low sugars, then cut back on metformin and update me.

## 2020-01-08 NOTE — Progress Notes (Signed)
This visit occurred during the SARS-CoV-2 public health emergency.  Safety protocols were in place, including screening questions prior to the visit, additional usage of staff PPE, and extensive cleaning of exam room while observing appropriate contact time as indicated for disinfecting solutions.  Diabetes:  Using medications without difficulties: yes Hypoglycemic episodes:no Hyperglycemic episodes: no Feet problems: no tingling Blood Sugars averaging: usually ~ 80-140s eye exam within last year: yes Labs d/w pt.   Elevated Cholesterol: Using medications without problems: yes Muscle aches: no Diet compliance: yes Exercise: yes Still on pravastatin.   Prev foot cramps resolved.    Hypertension:    Using medication without problems or lightheadedness: yes Chest pain with exertion:no Edema:no Short of breath:no  Still on ACE/HCTZ at baseline.   Small irritated whitehead on the R side of neck, noted yesterday.  The lesion could be expressed easily and was covered with a Band-Aid.  No complications.  Vit D low.  Still on replacement.  Labs d/w pt.    B12 low.  D/w pt about restarting replacement.    He had covid with change in taste and smell but he recovered.   Urinary sx.  He has to "concentrate" to urinate.  He has slower stream chronically.  PSA still wnl.    Flu 2020 Shingles up-to-date PNA up-to-date Tetanus 2013 covid vaccine d/w pt.   Colonoscopy 2018, d/w pt.   Prostate cancer screening 2021 Advance directive-wife designated if patient were incapacitated.  PMH and SH reviewed  Meds, vitals, and allergies reviewed.   ROS: Per HPI unless specifically indicated in ROS section   GEN: nad, alert and oriented HEENT: ncat NECK: supple w/o LA CV: rrr. PULM: ctab, no inc wob ABD: soft, +bs EXT: no edema SKIN: no acute rash, lesion on R neck expressed and covered with a bandaid.  No spreading erythema.    Diabetic foot exam: Normal inspection No skin  breakdown No calluses  Normal DP pulses Normal sensation to light touch and monofilament Nails normal  See after visit summary.

## 2020-01-12 ENCOUNTER — Telehealth: Payer: Self-pay | Admitting: Family Medicine

## 2020-01-12 DIAGNOSIS — Z0000 Encounter for general adult medical examination without abnormal findings: Secondary | ICD-10-CM | POA: Insufficient documentation

## 2020-01-12 MED ORDER — TAMSULOSIN HCL 0.4 MG PO CAPS: 0.4000 mg | ORAL_CAPSULE | Freq: Every day | ORAL | 3 refills | Status: DC

## 2020-01-12 NOTE — Assessment & Plan Note (Signed)
Advance directive- wife designated if patient were incapacitated.  

## 2020-01-12 NOTE — Telephone Encounter (Signed)
Please update patient.  I did not explicitly mention this at the visit but his urinary symptoms are likely due to BPH.  This is not an alarming situation since his PSA is still normal but if he is having significant symptoms that are bothersome and if he needs treatment then let me know.  In situations like this I usually wait until it is bothersome enough for the patient to want to start treatment.  If he wants to defer and avoid extra medication then that is fine too.  Thanks.

## 2020-01-12 NOTE — Assessment & Plan Note (Signed)
He can take 2000 units of vitamin D daily and recheck a level later on.  He agrees.

## 2020-01-12 NOTE — Assessment & Plan Note (Signed)
B12 low.  D/w pt about restarting replacement.   We can recheck follow-up labs later this year.  See orders.

## 2020-01-12 NOTE — Assessment & Plan Note (Signed)
Flu 2020 Shingles up-to-date PNA up-to-date Tetanus 2013 covid vaccine d/w pt.   Colonoscopy 2018, d/w pt.   Prostate cancer screening 2021 Advance directive-wife designated if patient were incapacitated.

## 2020-01-12 NOTE — Telephone Encounter (Signed)
Contacted pt and advised. Pt would like to start treatment. Advised this office would f/u when msg was received from PCP. Pt verbalized understanding

## 2020-01-12 NOTE — Assessment & Plan Note (Addendum)
No change in meds at this point.  Still on ACE inhibitor and hydrochlorothiazide.  Continue as is.  He agrees.  Labs discussed with patient.  He can update me if blood pressures persistently elevated.

## 2020-01-12 NOTE — Assessment & Plan Note (Signed)
Previous foot cramps have resolved.  Still on pravastatin.  Continue as is.  Continue work on diet and exercise.  He agrees.

## 2020-01-12 NOTE — Assessment & Plan Note (Signed)
Urinary sx.  He has to "concentrate" to urinate.  He has slower stream chronically.  PSA still wnl.   See follow-up phone note.

## 2020-01-12 NOTE — Telephone Encounter (Signed)
Rx sent for Flomax.  Okay to try 1 tablet a day.  It may make him a little lightheaded but otherwise this medication is usually well tolerated.  If he does not tolerate it or if he does not have any effect then please let me know.  His urine stream should be better with this medication.  Thanks.

## 2020-01-12 NOTE — Addendum Note (Signed)
Addended by: Tonia Ghent on: 01/12/2020 09:19 PM   Modules accepted: Orders

## 2020-01-12 NOTE — Assessment & Plan Note (Signed)
Continue Metformin.  Labs discussed with patient.  Continue work on diet and exercise.  He agrees.  Recheck periodically.  He agrees.

## 2020-01-13 NOTE — Telephone Encounter (Signed)
Left detailed message on voicemail. DPR 

## 2020-02-28 DIAGNOSIS — Z20822 Contact with and (suspected) exposure to covid-19: Secondary | ICD-10-CM | POA: Diagnosis not present

## 2020-02-28 DIAGNOSIS — Z03818 Encounter for observation for suspected exposure to other biological agents ruled out: Secondary | ICD-10-CM | POA: Diagnosis not present

## 2020-05-03 ENCOUNTER — Other Ambulatory Visit: Payer: Self-pay

## 2020-05-03 ENCOUNTER — Other Ambulatory Visit (INDEPENDENT_AMBULATORY_CARE_PROVIDER_SITE_OTHER): Payer: PPO

## 2020-05-03 DIAGNOSIS — D518 Other vitamin B12 deficiency anemias: Secondary | ICD-10-CM

## 2020-05-03 DIAGNOSIS — E559 Vitamin D deficiency, unspecified: Secondary | ICD-10-CM | POA: Diagnosis not present

## 2020-05-03 DIAGNOSIS — E119 Type 2 diabetes mellitus without complications: Secondary | ICD-10-CM

## 2020-05-03 LAB — CBC WITH DIFFERENTIAL/PLATELET
Basophils Absolute: 0.1 10*3/uL (ref 0.0–0.1)
Basophils Relative: 0.6 % (ref 0.0–3.0)
Eosinophils Absolute: 0.2 10*3/uL (ref 0.0–0.7)
Eosinophils Relative: 2.5 % (ref 0.0–5.0)
HCT: 37.7 % — ABNORMAL LOW (ref 39.0–52.0)
Hemoglobin: 12.8 g/dL — ABNORMAL LOW (ref 13.0–17.0)
Lymphocytes Relative: 47.6 % — ABNORMAL HIGH (ref 12.0–46.0)
Lymphs Abs: 3.9 10*3/uL (ref 0.7–4.0)
MCHC: 34 g/dL (ref 30.0–36.0)
MCV: 84.7 fl (ref 78.0–100.0)
Monocytes Absolute: 0.8 10*3/uL (ref 0.1–1.0)
Monocytes Relative: 10 % (ref 3.0–12.0)
Neutro Abs: 3.2 10*3/uL (ref 1.4–7.7)
Neutrophils Relative %: 39.3 % — ABNORMAL LOW (ref 43.0–77.0)
Platelets: 183 10*3/uL (ref 150.0–400.0)
RBC: 4.45 Mil/uL (ref 4.22–5.81)
RDW: 13.6 % (ref 11.5–15.5)
WBC: 8.2 10*3/uL (ref 4.0–10.5)

## 2020-05-03 LAB — VITAMIN D 25 HYDROXY (VIT D DEFICIENCY, FRACTURES): VITD: 28.71 ng/mL — ABNORMAL LOW (ref 30.00–100.00)

## 2020-05-03 LAB — HEMOGLOBIN A1C: Hgb A1c MFr Bld: 8.1 % — ABNORMAL HIGH (ref 4.6–6.5)

## 2020-05-03 LAB — VITAMIN B12: Vitamin B-12: >1526 pg/mL — ABNORMAL HIGH (ref 211–911)

## 2020-05-10 ENCOUNTER — Ambulatory Visit (INDEPENDENT_AMBULATORY_CARE_PROVIDER_SITE_OTHER): Payer: PPO | Admitting: Family Medicine

## 2020-05-10 ENCOUNTER — Other Ambulatory Visit: Payer: Self-pay

## 2020-05-10 ENCOUNTER — Encounter: Payer: Self-pay | Admitting: Family Medicine

## 2020-05-10 VITALS — BP 136/78 | HR 90 | Temp 97.1°F | Ht 72.0 in | Wt 191.2 lb

## 2020-05-10 DIAGNOSIS — Z1211 Encounter for screening for malignant neoplasm of colon: Secondary | ICD-10-CM | POA: Diagnosis not present

## 2020-05-10 DIAGNOSIS — D518 Other vitamin B12 deficiency anemias: Secondary | ICD-10-CM | POA: Diagnosis not present

## 2020-05-10 DIAGNOSIS — E119 Type 2 diabetes mellitus without complications: Secondary | ICD-10-CM | POA: Diagnosis not present

## 2020-05-10 MED ORDER — METFORMIN HCL 500 MG PO TABS: ORAL_TABLET | ORAL | Status: DC

## 2020-05-10 MED ORDER — VITAMIN B-12 1000 MCG PO TABS: 1000.0000 ug | ORAL_TABLET | Freq: Every day | ORAL | Status: DC

## 2020-05-10 NOTE — Patient Instructions (Signed)
Cut B12 back to 1 tab a day (109mcg a day). Recheck labs in about 4 months.   Take care.  Glad to see you. I would try increasing metformin to 2 in the morning and 1 in the evening.

## 2020-05-10 NOTE — Progress Notes (Signed)
This visit occurred during the SARS-CoV-2 public health emergency.  Safety protocols were in place, including screening questions prior to the visit, additional usage of staff PPE, and extensive cleaning of exam room while observing appropriate contact time as indicated for disinfecting solutions.  Diabetes:  Using medications without difficulties: yes, metformin BID.   Hypoglycemic episodes:no Hyperglycemic episodes:no Feet problems: no Blood Sugars averaging: 140s or lower, occ higher.   eye exam within last year: f/u pending.  Labs d/w pt.    CBC d/w pt.  B12 high, hgb stable.  No bleeding.   He needed get set up with GI in [place].  D/w pt. referral ordered.  We talked about covid vaccine.  He'll check on getting that at the pharmacy.  Rationale d/w pt. detailed discussion with patient.  Meds, vitals, and allergies reviewed.   ROS: Per HPI unless specifically indicated in ROS section   GEN: nad, alert and oriented HEENT: ncat NECK: supple w/o LA CV: rrr. PULM: ctab, no inc wob ABD: soft, +bs EXT: no edema SKIN: no acute rash At least 30 minutes were devoted to patient care in this encounter (this can potentially include time spent reviewing the patient's file/history, interviewing and examining the patient, counseling/reviewing plan with patient, ordering referrals, ordering tests, reviewing relevant laboratory or x-ray data, and documenting the encounter).

## 2020-05-16 DIAGNOSIS — Z1211 Encounter for screening for malignant neoplasm of colon: Secondary | ICD-10-CM | POA: Insufficient documentation

## 2020-05-16 NOTE — Assessment & Plan Note (Signed)
Discussed options.  He will continue work on diet and exercise.  Discussed increasing metformin to 2 in the morning and 1 in the evening.  See after visit summary.

## 2020-05-16 NOTE — Assessment & Plan Note (Signed)
He needed get set up with GI in [place].  D/w pt. referral ordered.

## 2020-05-16 NOTE — Assessment & Plan Note (Signed)
CBC d/w pt.  B12 high, hgb stable.  No bleeding.  He can cut B12 back to 1000 mcg a day.

## 2020-05-19 ENCOUNTER — Other Ambulatory Visit: Payer: Self-pay

## 2020-05-19 ENCOUNTER — Telehealth (INDEPENDENT_AMBULATORY_CARE_PROVIDER_SITE_OTHER): Payer: Self-pay | Admitting: Gastroenterology

## 2020-05-19 DIAGNOSIS — Z86010 Personal history of colonic polyps: Secondary | ICD-10-CM

## 2020-05-19 MED ORDER — NA SULFATE-K SULFATE-MG SULF 17.5-3.13-1.6 GM/177ML PO SOLN: 1.0000 | Freq: Once | ORAL | 0 refills | Status: AC

## 2020-05-19 NOTE — Progress Notes (Signed)
Gastroenterology Pre-Procedure Review  Request Date: 06/07/20 Requesting Physician: Dr. Marius Ditch  PATIENT REVIEW QUESTIONS: The patient responded to the following health history questions as indicated:    1. Are you having any GI issues? no 2. 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 yes (Dr. Ardis Hughs removed 6 polyps in August 2018) 3. Do you have a family history of Colon Cancer or Polyps? no 4. Diabetes Mellitus? yes (type 2) 5. Joint replacements in the past 12 months?no 6. Major health problems in the past 3 months?COVID January 2021 7. Any artificial heart valves, MVP, or defibrillator?no    MEDICATIONS & ALLERGIES:    Patient reports the following regarding taking any anticoagulation/antiplatelet therapy:   Plavix, Coumadin, Eliquis, Xarelto, Lovenox, Pradaxa, Brilinta, or Effient? no Aspirin? yes (81 mg daily)  Patient confirms/reports the following medications:  Current Outpatient Medications  Medication Sig Dispense Refill  . Ascorbic Acid (VITAMIN C) 1000 MG tablet Take 1,000 mg by mouth daily.    Marland Kitchen aspirin EC 81 MG tablet Take 81 mg by mouth daily.    . Blood Glucose Monitoring Suppl (ONE TOUCH ULTRA 2) w/Device KIT Use as instructed to check blood sugar daily as needed.  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 1 each 0  . Cholecalciferol (VITAMIN D-3) 25 MCG (1000 UT) CAPS Take 2 capsules (2,000 Units total) by mouth daily.    Marland Kitchen docusate sodium (COLACE) 100 MG capsule Take 100 mg by mouth 2 (two) times daily.    Marland Kitchen esomeprazole (NEXIUM) 40 MG capsule TAKE 1 CAPSULE BY MOUTH DAILY AT 12 NOON 90 capsule 3  . ezetimibe (ZETIA) 10 MG tablet TAKE 1 TABLET BY MOUTH ONCE DAILY 90 tablet 3  . fluticasone (FLONASE) 50 MCG/ACT nasal spray PLACE 1 SPRAY INTO BOTH NOSTRILS TWICE DAILY 48 g 3  . folic acid (FOLVITE) 854 MCG tablet Take 400 mcg by mouth daily.    Marland Kitchen glucose blood (ONE TOUCH ULTRA TEST) test strip Use as instructed to check blood sugar daily as needed.  Diagnosis:  E11.9   Non insulin dependent. 100 each 3  . Lancets (ONETOUCH ULTRASOFT) lancets Use as instructed to check blood sugar daily as needed.  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 100 each 3  . levocetirizine (XYZAL) 5 MG tablet Take 1 tablet (5 mg total) by mouth every evening. 90 tablet 3  . lisinopril-hydrochlorothiazide (ZESTORETIC) 10-12.5 MG tablet Take 0.5 tablets by mouth daily. 45 tablet 3  . metFORMIN (GLUCOPHAGE) 500 MG tablet 2 in the AM and 1 in the PM.    . Omega-3 Fatty Acids (FISH OIL PO) Take 2,400 mg by mouth daily.     . potassium chloride (KLOR-CON) 10 MEQ tablet TAKE 1 TABLET BY MOUTH 2 TIMES DAILY 180 tablet 3  . pravastatin (PRAVACHOL) 20 MG tablet TAKE 1 TABLET BY MOUTH DAILY 90 tablet 3  . tamsulosin (FLOMAX) 0.4 MG CAPS capsule Take 1 capsule (0.4 mg total) by mouth daily. 90 capsule 3  . vitamin B-12 (CYANOCOBALAMIN) 1000 MCG tablet Take 1 tablet (1,000 mcg total) by mouth daily.     No current facility-administered medications for this visit.    Patient confirms/reports the following allergies:  Allergies  Allergen Reactions  . Atorvastatin     Myalgias    No orders of the defined types were placed in this encounter.   AUTHORIZATION INFORMATION Primary Insurance: 1D#: Group #:  Secondary Insurance: 1D#: Group #:  SCHEDULE INFORMATION: Date: 06/07/20 Time: Location:[facility]

## 2020-06-03 ENCOUNTER — Other Ambulatory Visit
Admission: RE | Admit: 2020-06-03 | Discharge: 2020-06-03 | Disposition: A | Discharge status: Home / Self Care | Payer: PPO | Source: Ambulatory Visit | Attending: Gastroenterology | Admitting: Gastroenterology

## 2020-06-03 ENCOUNTER — Other Ambulatory Visit: Payer: Self-pay

## 2020-06-03 DIAGNOSIS — Z20822 Contact with and (suspected) exposure to covid-19: Secondary | ICD-10-CM | POA: Diagnosis not present

## 2020-06-03 DIAGNOSIS — Z01812 Encounter for preprocedural laboratory examination: Secondary | ICD-10-CM | POA: Insufficient documentation

## 2020-06-04 ENCOUNTER — Other Ambulatory Visit: Payer: PPO

## 2020-06-04 LAB — SARS CORONAVIRUS 2 (TAT 6-24 HRS): SARS Coronavirus 2: NEGATIVE

## 2020-06-07 ENCOUNTER — Ambulatory Visit: Payer: PPO | Admitting: Certified Registered"

## 2020-06-07 ENCOUNTER — Encounter: Payer: Self-pay | Admitting: Gastroenterology

## 2020-06-07 ENCOUNTER — Encounter
Admission: RE | Disposition: A | Discharge status: Home / Self Care | Payer: Self-pay | Source: Home / Self Care | Attending: Gastroenterology

## 2020-06-07 ENCOUNTER — Ambulatory Visit
Admission: RE | Admit: 2020-06-07 | Discharge: 2020-06-07 | Disposition: A | Discharge status: Home / Self Care | Payer: PPO | Attending: Gastroenterology | Admitting: Gastroenterology

## 2020-06-07 DIAGNOSIS — K635 Polyp of colon: Secondary | ICD-10-CM | POA: Diagnosis not present

## 2020-06-07 DIAGNOSIS — K219 Gastro-esophageal reflux disease without esophagitis: Secondary | ICD-10-CM | POA: Diagnosis not present

## 2020-06-07 DIAGNOSIS — Z860100 Personal history of colon polyps, unspecified: Secondary | ICD-10-CM

## 2020-06-07 DIAGNOSIS — Z79899 Other long term (current) drug therapy: Secondary | ICD-10-CM | POA: Insufficient documentation

## 2020-06-07 DIAGNOSIS — E119 Type 2 diabetes mellitus without complications: Secondary | ICD-10-CM | POA: Diagnosis not present

## 2020-06-07 DIAGNOSIS — E785 Hyperlipidemia, unspecified: Secondary | ICD-10-CM | POA: Diagnosis not present

## 2020-06-07 DIAGNOSIS — Z7984 Long term (current) use of oral hypoglycemic drugs: Secondary | ICD-10-CM | POA: Diagnosis not present

## 2020-06-07 DIAGNOSIS — I10 Essential (primary) hypertension: Secondary | ICD-10-CM | POA: Diagnosis not present

## 2020-06-07 DIAGNOSIS — D124 Benign neoplasm of descending colon: Secondary | ICD-10-CM | POA: Diagnosis not present

## 2020-06-07 DIAGNOSIS — D128 Benign neoplasm of rectum: Secondary | ICD-10-CM | POA: Insufficient documentation

## 2020-06-07 DIAGNOSIS — Z8719 Personal history of other diseases of the digestive system: Secondary | ICD-10-CM | POA: Diagnosis not present

## 2020-06-07 DIAGNOSIS — Z1211 Encounter for screening for malignant neoplasm of colon: Secondary | ICD-10-CM | POA: Insufficient documentation

## 2020-06-07 DIAGNOSIS — K621 Rectal polyp: Secondary | ICD-10-CM

## 2020-06-07 DIAGNOSIS — M1990 Unspecified osteoarthritis, unspecified site: Secondary | ICD-10-CM | POA: Diagnosis not present

## 2020-06-07 DIAGNOSIS — Z87891 Personal history of nicotine dependence: Secondary | ICD-10-CM | POA: Diagnosis not present

## 2020-06-07 DIAGNOSIS — K644 Residual hemorrhoidal skin tags: Secondary | ICD-10-CM | POA: Insufficient documentation

## 2020-06-07 DIAGNOSIS — Z7982 Long term (current) use of aspirin: Secondary | ICD-10-CM | POA: Insufficient documentation

## 2020-06-07 HISTORY — DX: Dyspnea, unspecified: R06.00

## 2020-06-07 HISTORY — PX: COLONOSCOPY WITH PROPOFOL: SHX5780

## 2020-06-07 LAB — GLUCOSE, CAPILLARY: Glucose-Capillary: 118 mg/dL — ABNORMAL HIGH (ref 70–99)

## 2020-06-07 SURGERY — COLONOSCOPY WITH PROPOFOL
Anesthesia: General

## 2020-06-07 MED ORDER — SODIUM CHLORIDE 0.9 % IV SOLN: INTRAVENOUS | Status: DC

## 2020-06-07 MED ORDER — PROPOFOL 10 MG/ML IV BOLUS
INTRAVENOUS | Status: AC
  Filled 2020-06-07: qty 20

## 2020-06-07 MED ORDER — PROPOFOL 500 MG/50ML IV EMUL
INTRAVENOUS | Status: AC
  Filled 2020-06-07: qty 50

## 2020-06-07 MED ORDER — PROPOFOL 10 MG/ML IV BOLUS
INTRAVENOUS | Status: DC | PRN
  Administered 2020-06-07: 50 mg via INTRAVENOUS

## 2020-06-07 MED ORDER — PROPOFOL 500 MG/50ML IV EMUL
INTRAVENOUS | Status: DC | PRN
  Administered 2020-06-07: 150 ug/kg/min via INTRAVENOUS

## 2020-06-07 NOTE — Transfer of Care (Signed)
Immediate Anesthesia Transfer of Care Note  Patient: Thomas Todd  Procedure(s) Performed: COLONOSCOPY WITH PROPOFOL (N/A )  Patient Location: PACU and Endoscopy Unit  Anesthesia Type:General  Level of Consciousness: drowsy  Airway & Oxygen Therapy: Patient Spontanous Breathing  Post-op Assessment: Report given to RN  Post vital signs: stable  Last Vitals:  Vitals Value Taken Time  BP    Temp    Pulse    Resp    SpO2      Last Pain:  Vitals:   06/07/20 0812  TempSrc: Tympanic  PainSc: 0-No pain         Complications: No complications documented.

## 2020-06-07 NOTE — Progress Notes (Addendum)
   06/07/20 0750  Clinical Encounter Type  Visited With Patient and family together  Visit Type Initial;Spiritual support;Pre-op  Referral From Chaplain  Consult/Referral To Chaplain  While rounding [facility] waiting area, chaplain spoke to pt and his wife of 57 yrs. Pt was in a chirpy mood. Pt said prepping for procedure was the worst part of the entire process. His wife smiled when he said funny things. Chaplain told pt she would be praying for him. He said he was not ashamed of God. Chaplain asked if she could pray for him and he said yes. Chaplain knelt down between pt and his wife and prayed. After praying, chaplain wished pt well and left.

## 2020-06-07 NOTE — Op Note (Signed)
[facility] Patient Name: Thomas Todd Procedure Date: 06/07/2020 8:55 AM MRN: [id number] Account #: [id number] Date of Birth: 1951-04-07 Admit Type: Outpatient Age: 69 Room: [facility] ENDO ROOM 2 Gender: Male Note Status: Finalized Procedure:             Colonoscopy Indications:           Surveillance: Personal history of adenomatous polyps                         on last colonoscopy 3 years ago, Last colonoscopy:                         August 2018 Providers:             Lin Landsman MD, MD Referring MD:          Elveria Rising. Damita Dunnings, MD (Referring MD) Medicines:             Monitored Anesthesia Care Complications:         No immediate complications. Estimated blood loss: None. Procedure:             Pre-Anesthesia Assessment:                        - Prior to the procedure, a History and Physical was                         performed, and patient medications and allergies were                         reviewed. The patient is competent. The risks and                         benefits of the procedure and the sedation options and                         risks were discussed with the patient. All questions                         were answered and informed consent was obtained.                         Patient identification and proposed procedure were                         verified by the physician, the nurse, the                         anesthesiologist, the anesthetist and the technician                         in the pre-procedure area in the procedure room in the                         endoscopy suite. Mental Status Examination: alert and                         oriented. Airway Examination: normal oropharyngeal  airway and neck mobility. Respiratory Examination:                         clear to auscultation. CV Examination: normal.                         Prophylactic Antibiotics: The patient does not require                          prophylactic antibiotics. Prior Anticoagulants: The                         patient has taken no previous anticoagulant or                         antiplatelet agents. ASA Grade Assessment: II - A                         patient with mild systemic disease. After reviewing                         the risks and benefits, the patient was deemed in                         satisfactory condition to undergo the procedure. The                         anesthesia plan was to use monitored anesthesia care                         (MAC). Immediately prior to administration of                         medications, the patient was re-assessed for adequacy                         to receive sedatives. The heart rate, respiratory                         rate, oxygen saturations, blood pressure, adequacy of                         pulmonary ventilation, and response to care were                         monitored throughout the procedure. The physical                         status of the patient was re-assessed after the                         procedure.                        After obtaining informed consent, the colonoscope was                         passed under direct vision. Throughout the procedure,  the patient's blood pressure, pulse, and oxygen                         saturations were monitored continuously. The                         Colonoscope was introduced through the anus and                         advanced to the the cecum, identified by appendiceal                         orifice and ileocecal valve. The colonoscopy was                         performed without difficulty. The patient tolerated                         the procedure well. The quality of the bowel                         preparation was evaluated using the BBPS [facility] Bowel                         Preparation Scale) with scores of: Right Colon = 3,                         Transverse Colon  = 3 and Left Colon = 3 (entire mucosa                         seen well with no residual staining, small fragments                         of stool or opaque liquid). The total BBPS score                         equals 9. Findings:      The perianal and digital rectal examinations were normal. Pertinent       negatives include normal sphincter tone and no palpable rectal lesions.      A 5 mm polyp was found in the descending colon. The polyp was sessile.       The polyp was removed with a cold snare. Resection and retrieval were       complete.      Four sessile polyps were found in the rectum. The polyps were 4 to 7 mm       in size. These polyps were removed with a cold snare. Resection and       retrieval were complete.      And, hyperplastic polyps in rectum, not resected      Non-bleeding external hemorrhoids were found during retroflexion. The       hemorrhoids were medium-sized. Impression:            - One 5 mm polyp in the descending colon, removed with                         a cold snare. Resected and retrieved.                        -  Four 4 to 7 mm polyps in the rectum, removed with a                         cold snare. Resected and retrieved.                        - Non-bleeding external hemorrhoids. Recommendation:        - Discharge patient to home (with escort).                        - Resume previous diet today.                        - Continue present medications.                        - Await pathology results.                        - Repeat colonoscopy in 3 - 5 years for surveillance                         based on pathology results. Procedure Code(s):     --- Professional ---                        [phone], Colonoscopy, flexible; with removal of                         tumor(s), polyp(s), or other lesion(s) by snare                         technique Diagnosis Code(s):     --- Professional ---                        Z86.010, Personal history of colonic  polyps                        K63.5, Polyp of colon                        K62.1, Rectal polyp                        K64.4, Residual hemorrhoidal skin tags CPT copyright 2019 American Medical Association. All rights reserved. The codes documented in this report are preliminary and upon coder review may  be revised to meet current compliance requirements. Dr. Ulyess Mort Lin Landsman MD, MD 06/07/2020 9:32:30 AM This report has been signed electronically. Number of Addenda: 0 Note Initiated On: 06/07/2020 8:55 AM Scope Withdrawal Time: 0 hours 16 minutes 5 seconds  Total Procedure Duration: 0 hours 20 minutes 0 seconds  Estimated Blood Loss:  Estimated blood loss: none.      [facility]

## 2020-06-07 NOTE — Anesthesia Postprocedure Evaluation (Signed)
Anesthesia Post Note  Patient: Thomas Todd  Procedure(s) Performed: COLONOSCOPY WITH PROPOFOL (N/A )  Patient location during evaluation: Endoscopy Anesthesia Type: General Level of consciousness: awake and alert Pain management: pain level controlled Vital Signs Assessment: post-procedure vital signs reviewed and stable Respiratory status: spontaneous breathing, nonlabored ventilation, respiratory function stable and patient connected to nasal cannula oxygen Cardiovascular status: blood pressure returned to baseline and stable Postop Assessment: no apparent nausea or vomiting Anesthetic complications: no   No complications documented.   Last Vitals:  Vitals:   06/07/20 0955 06/07/20 1005  BP: (!) 138/93 (!) 149/87  Pulse: 63 60  Resp: 16 17  Temp:    SpO2: 99% 97%    Last Pain:  Vitals:   06/07/20 1005  TempSrc:   PainSc: 0-No pain                 Arita Miss

## 2020-06-07 NOTE — Anesthesia Preprocedure Evaluation (Signed)
Anesthesia Evaluation  Patient identified by MRN, date of birth, ID band Patient awake    Reviewed: Allergy & Precautions, NPO status , Patient's Chart, lab work & pertinent test results  History of Anesthesia Complications Negative for: history of anesthetic complications  Airway Mallampati: III  TM Distance: >3 FB Neck ROM: Full    Dental  (+) Teeth Intact, Implants   Pulmonary shortness of breath and with exertion, neg sleep apnea, neg COPD, Patient abstained from smoking.Not current smoker, former smoker,    Pulmonary exam normal breath sounds clear to auscultation       Cardiovascular Exercise Tolerance: Good METShypertension, (-) CAD and (-) Past MI (-) dysrhythmias  Rhythm:Regular Rate:Normal - Systolic murmurs    Neuro/Psych negative neurological ROS  negative psych ROS   GI/Hepatic GERD  Medicated,(+)     (-) substance abuse  ,   Endo/Other  diabetes  Renal/GU negative Renal ROS     Musculoskeletal   Abdominal   Peds  Hematology   Anesthesia Other Findings Past Medical History: No date: Allergy No date: Anemia No date: Arthritis No date: Diabetes mellitus without complication (HCC)     Comment:  no medications taken 05-04-17 No date: Dyspnea No date: GERD (gastroesophageal reflux disease) No date: Hyperlipidemia No date: Hypertension No date: LBP (low back pain)     Comment:  with sciatica  No date: Pancreatitis     Comment:  Pancreatitis etoh 08/1998  Reproductive/Obstetrics                             Anesthesia Physical Anesthesia Plan  ASA: II  Anesthesia Plan: General   Post-op Pain Management:    Induction: Intravenous  PONV Risk Score and Plan: 2 and Ondansetron, Propofol infusion and TIVA  Airway Management Planned: Nasal Cannula  Additional Equipment: None  Intra-op Plan:   Post-operative Plan:   Informed Consent: I have reviewed the patients  History and Physical, chart, labs and discussed the procedure including the risks, benefits and alternatives for the proposed anesthesia with the patient or authorized representative who has indicated his/her understanding and acceptance.     Dental advisory given  Plan Discussed with: CRNA and Surgeon  Anesthesia Plan Comments: (Discussed risks of anesthesia with patient, including possibility of difficulty with spontaneous ventilation under anesthesia necessitating airway intervention, PONV, and rare risks such as cardiac or respiratory or neurological events. Patient understands.)        Anesthesia Quick Evaluation

## 2020-06-07 NOTE — H&P (Signed)
Thomas Darby, MD [address]  [place]  [place], [place] [address]  Main: [phone]  Fax: [phone] Pager: [phone]  Primary Care Physician:  Tonia Ghent, MD Primary Gastroenterologist:  Dr. Cephas Todd  Pre-Procedure History & Physical: HPI:  Chandan Fly is a 69 y.o. male is here for an colonoscopy.   Past Medical History:  Diagnosis Date  . Allergy   . Anemia   . Arthritis   . Diabetes mellitus without complication ([place])    no medications taken 05-04-17  . Dyspnea   . GERD (gastroesophageal reflux disease)   . Hyperlipidemia   . Hypertension   . LBP (low back pain)    with sciatica   . Pancreatitis    Pancreatitis etoh 08/1998    Past Surgical History:  Procedure Laterality Date  . COLONOSCOPY    . dental implant    . LAMINECTOMY  1993   L5/S1  . SEPTOPLASTY  1970    Prior to Admission medications   Medication Sig Start Date End Date Taking? Authorizing Provider  lisinopril-hydrochlorothiazide (ZESTORETIC) 10-12.5 MG tablet Take 0.5 tablets by mouth daily. 01/08/20  Yes Tonia Ghent, MD  metFORMIN (GLUCOPHAGE) 500 MG tablet 2 in the AM and 1 in the PM. 05/10/20  Yes Tonia Ghent, MD  Ascorbic Acid (VITAMIN C) 1000 MG tablet Take 1,000 mg by mouth daily.    [provider]  aspirin EC 81 MG tablet Take 81 mg by mouth daily.    [provider]  Blood Glucose Monitoring Suppl (ONE TOUCH ULTRA 2) w/Device KIT Use as instructed to check blood sugar daily as needed.  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 11/28/18   Tonia Ghent, MD  Cholecalciferol (VITAMIN D-3) 25 MCG (1000 UT) CAPS Take 2 capsules (2,000 Units total) by mouth daily. 01/08/20   Tonia Ghent, MD  docusate sodium (COLACE) 100 MG capsule Take 100 mg by mouth 2 (two) times daily.    [provider]  esomeprazole (NEXIUM) 40 MG capsule TAKE 1 CAPSULE BY MOUTH DAILY AT 12 NOON 08/07/19   Tonia Ghent, MD  ezetimibe (ZETIA) 10 MG  tablet TAKE 1 TABLET BY MOUTH ONCE DAILY 11/03/19   Tonia Ghent, MD  fluticasone [facility]) 50 MCG/ACT nasal spray PLACE 1 SPRAY INTO BOTH NOSTRILS TWICE DAILY 12/01/19   Tonia Ghent, MD  folic acid (FOLVITE) 638 MCG tablet Take 400 mcg by mouth daily.    [provider]  glucose blood (ONE TOUCH ULTRA TEST) test strip Use as instructed to check blood sugar daily as needed.  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 11/04/19   Tonia Ghent, MD  Lancets [facility] ULTRASOFT) lancets Use as instructed to check blood sugar daily as needed.  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 11/28/18   Tonia Ghent, MD  levocetirizine [facility]) 5 MG tablet Take 1 tablet (5 mg total) by mouth every evening. 01/08/20   Tonia Ghent, MD  Omega-3 Fatty Acids (FISH OIL PO) Take 2,400 mg by mouth daily.     [provider]  potassium chloride (KLOR-CON) 10 MEQ tablet TAKE 1 TABLET BY MOUTH 2 TIMES DAILY 08/07/19   Tonia Ghent, MD  pravastatin (PRAVACHOL) 20 MG tablet TAKE 1 TABLET BY MOUTH DAILY 08/07/19   Tonia Ghent, MD  tamsulosin (FLOMAX) 0.4 MG CAPS capsule Take 1 capsule (0.4 mg total) by mouth daily. 01/12/20   Elsie Stain  S, MD  vitamin B-12 (CYANOCOBALAMIN) 1000 MCG tablet Take 1 tablet (1,000 mcg total) by mouth daily. 05/10/20   Tonia Ghent, MD    Allergies as of 05/19/2020 - Review Complete 05/10/2020  Allergen Reaction Noted  . Atorvastatin  02/16/2014    Family History  Problem Relation Age of Onset  . Hypertension Mother   . Diabetes Mother   . Hyperlipidemia Mother   . Hypothyroidism Mother   . Dementia Mother   . Alcohol abuse Father   . Heart disease Father   . Heart attack Father   . Diabetes Sister   . Colon cancer Neg Hx   . Prostate cancer Neg Hx   . Esophageal cancer Neg Hx   . Stomach cancer Neg Hx   . Rectal cancer Neg Hx     Social History   Socioeconomic History  . Marital status: Married    Spouse name: Not on file  .  Number of children: Not on file  . Years of education: Not on file  . Highest education level: Not on file  Occupational History  . Not on file  Tobacco Use  . Smoking status: Former Smoker    Years: 40.00    Quit date: 09/17/2009    Years since quitting: 10.7  . Smokeless tobacco: Never Used  . Tobacco comment: vapor  Vaping Use  . Vaping Use: Every day  . Devices: vaping only   Substance and Sexual Activity  . 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 Drug use: No  . Sexual activity: Not on file  Other Topics Concern  . Not on file  Social History Narrative   Sober since 1999   Prev did [employer], worked for [employer]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, retired as of 2015   Married 1975   Prev had 1 daughter, was chronically ill and died August 06, 2016   Social Determinants of Health   Financial Resource Strain: Low Risk   . Difficulty of Paying Living Expenses: Not hard at all  Food Insecurity: No Food Insecurity  . Worried About Charity fundraiser in the Last Year: Never true  . Ran Out of Food in the Last Year: Never true  Transportation Needs: No Transportation Needs  . Lack of Transportation (Medical): No  . Lack of Transportation (Non-Medical): No  Physical Activity: Inactive  . Days of Exercise per Week: 0 days  . Minutes of Exercise per Session: 0 min  Stress: No Stress Concern Present  . Feeling of Stress : Not at all  Social Connections:   . Frequency of Communication with Friends and Family: Not on file  . Frequency of Social Gatherings with Friends and Family: Not on file  . Attends Religious Services: Not on file  . Active Member of Clubs or Organizations: Not on file  . Attends Archivist Meetings: Not on file  . Marital Status: Not on file  Intimate Partner Violence: Not At Risk  . Fear of Current or Ex-Partner: No  . Emotionally Abused: No  . Physically Abused: No  . Sexually Abused: No    Review of Systems: See HPI, otherwise negative  ROS  Physical Exam: BP (!) 163/104   Pulse 73   Temp (!) 97.4 F (36.3 C) (Tympanic)   Resp 18   Ht 5' 10"  (1.778 m)   Wt 83.9 kg   SpO2 99%   BMI 26.54 kg/m  General:   Alert,  pleasant and cooperative  in NAD Head:  Normocephalic and atraumatic. Neck:  Supple; no masses or thyromegaly. Lungs:  Clear throughout to auscultation.    Heart:  Regular rate and rhythm. Abdomen:  Soft, nontender and nondistended. Normal bowel sounds, without guarding, and without rebound.   Neurologic:  Alert and  oriented x4;  grossly normal neurologically.  Impression/Plan: Jlen Wintle is here for an colonoscopy to be performed for personal history of colon polyps  Risks, benefits, limitations, and alternatives regarding  colonoscopy have been reviewed with the patient.  Questions have been answered.  All parties agreeable.   Sherri Sear, MD  06/07/2020, 8:22 AM

## 2020-06-08 LAB — SURGICAL PATHOLOGY

## 2020-06-09 ENCOUNTER — Encounter: Payer: Self-pay | Admitting: Gastroenterology

## 2020-07-03 ENCOUNTER — Other Ambulatory Visit: Payer: Self-pay | Admitting: Family Medicine

## 2020-07-05 ENCOUNTER — Other Ambulatory Visit: Payer: Self-pay | Admitting: Family Medicine

## 2020-07-05 NOTE — Telephone Encounter (Signed)
Last fill 05/10/20 Last OV 05/10/20

## 2020-07-07 ENCOUNTER — Telehealth: Payer: Self-pay | Admitting: Family Medicine

## 2020-07-07 MED ORDER — METFORMIN HCL 500 MG PO TABS
ORAL_TABLET | ORAL | 1 refills | Status: DC
Start: 2020-07-07 — End: 2020-09-20

## 2020-07-07 NOTE — Telephone Encounter (Signed)
I resent the prescription.  Please thank the pharmacy for checking on this.    Based on my previous notes, the plan will to increase metformin to 2 in the morning and 1 in the evening.  Thanks.

## 2020-07-07 NOTE — Telephone Encounter (Signed)
[place], called for clarification on a rx. Metformin 270 tablets  3 x a day. How do you want it taken?  Patient said the dosage increased.  The quantity was increased, but not the dosage.

## 2020-07-29 ENCOUNTER — Telehealth: Payer: Self-pay | Admitting: Family Medicine

## 2020-07-29 NOTE — Progress Notes (Signed)
  Chronic Care Management   Outreach Note  07/29/2020 Name: Thomas Todd MRN: [id number] DOB: December 17, 1950  Referred by: Tonia Ghent, MD Reason for referral : Chronic Care Management   An unsuccessful telephone outreach was attempted today. The patient was referred to the pharmacist for assistance with care management and care coordination.   Follow Up Plan:   [name]  Upstream Scheduler

## 2020-07-30 ENCOUNTER — Telehealth: Payer: Self-pay | Admitting: Family Medicine

## 2020-07-30 NOTE — Chronic Care Management (AMB) (Signed)
  Chronic Care Management   Note  07/30/2020 Name: Thomas Todd MRN: [id number] DOB: 08-May-1951  Thomas Todd is a 69 y.o. year old male who is a primary care patient of Tonia Ghent, MD. I reached out to Thomas Todd by phone today in response to a referral sent by Thomas Todd's PCP, Tonia Ghent, MD.   Thomas Todd was given information about Chronic Care Management services today including:  1. CCM service includes personalized support from designated clinical staff supervised by his physician, including individualized plan of care and coordination with other care providers 2. 24/7 contact phone numbers for assistance for urgent and routine care needs. 3. Service will only be billed when office clinical staff spend 20 minutes or more in a month to coordinate care. 4. Only one practitioner may furnish and bill the service in a calendar month. 5. The patient may stop CCM services at any time (effective at the end of the month) by phone call to the office staff.   Patient agreed to services and verbal consent obtained.   Follow up plan:   Thomas Todd

## 2020-08-09 ENCOUNTER — Other Ambulatory Visit: Payer: Self-pay | Admitting: Family Medicine

## 2020-08-09 NOTE — Telephone Encounter (Signed)
Pharmacy requests refill on: Potassium Chloride ER 10 mEQ  LAST REFILL: 08/07/2019 LAST OV: 05/10/2020 NEXT OV: 09/13/2020 PHARMACY: [place]

## 2020-08-29 ENCOUNTER — Other Ambulatory Visit: Payer: Self-pay | Admitting: Family Medicine

## 2020-08-29 DIAGNOSIS — E559 Vitamin D deficiency, unspecified: Secondary | ICD-10-CM

## 2020-08-29 DIAGNOSIS — D518 Other vitamin B12 deficiency anemias: Secondary | ICD-10-CM

## 2020-08-29 DIAGNOSIS — E119 Type 2 diabetes mellitus without complications: Secondary | ICD-10-CM

## 2020-09-07 ENCOUNTER — Telehealth: Payer: Self-pay

## 2020-09-07 NOTE — Chronic Care Management (AMB) (Addendum)
Chronic Care Management Pharmacy Assistant   Name: Thomas Todd  MRN: [id number] DOB: June 23, 1951  Reason for Encounter: Disease State  Patient Questions:  1.  Have you seen any other providers since your last visit? Yes 06/07/20- Dr. Sherri Todd- Gastroenterology. Colonoscopy.    2.  Any changes in your medicines or health? Yes 07/07/20 Dr. Damita Todd increased Metformin to 2 tablets in the morning and 1 in the evening.   PCP : Thomas Ghent, MD  Allergies:   Allergies  Allergen Reactions   Atorvastatin     Myalgias    Medications: Outpatient Encounter Medications as of 09/07/2020  Medication Sig   Ascorbic Acid (VITAMIN C) 1000 MG tablet Take 1,000 mg by mouth daily.   aspirin EC 81 MG tablet Take 81 mg by mouth daily.   Blood Glucose Monitoring Suppl (ONE TOUCH ULTRA 2) w/Device KIT Use as instructed to check blood sugar daily as needed.  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   Cholecalciferol (VITAMIN D-3) 25 MCG (1000 UT) CAPS Take 2 capsules (2,000 Units total) by mouth daily.   docusate sodium (COLACE) 100 MG capsule Take 100 mg by mouth 2 (two) times daily.   esomeprazole (NEXIUM) 40 MG capsule TAKE 1 CAPSULE BY MOUTH DAILY AT 12 NOON   ezetimibe (ZETIA) 10 MG tablet TAKE 1 TABLET BY MOUTH ONCE DAILY   fluticasone (FLONASE) 50 MCG/ACT nasal spray PLACE 1 SPRAY INTO BOTH NOSTRILS TWICE DAILY   folic acid (FOLVITE) 102 MCG tablet Take 400 mcg by mouth daily.   glucose blood (ONE TOUCH ULTRA TEST) test strip Use as instructed to check blood sugar daily as needed.  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   Lancets (ONETOUCH ULTRASOFT) lancets Use as instructed to check blood sugar daily as needed.  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   levocetirizine (XYZAL) 5 MG tablet Take 1 tablet (5 mg total) by mouth every evening.   lisinopril-hydrochlorothiazide (ZESTORETIC) 10-12.5 MG tablet Take 0.5 tablets by mouth daily.   metFORMIN (GLUCOPHAGE) 500 MG tablet  Take 2 tablets in the morning and 1 tablet in the evening.   Omega-3 Fatty Acids (FISH OIL PO) Take 2,400 mg by mouth daily.    potassium chloride (KLOR-CON) 10 MEQ tablet TAKE 1 TABLET BY MOUTH 2 TIMES DAILY   pravastatin (PRAVACHOL) 20 MG tablet TAKE 1 TABLET BY MOUTH DAILY   tamsulosin (FLOMAX) 0.4 MG CAPS capsule Take 1 capsule (0.4 mg total) by mouth daily.   vitamin B-12 (CYANOCOBALAMIN) 1000 MCG tablet Take 1 tablet (1,000 mcg total) by mouth daily.   No facility-administered encounter medications on file as of 09/07/2020.    Current Diagnosis: Patient Active Problem List   Diagnosis Date Noted   History of colonic polyps    Colon cancer screening 05/16/2020   Healthcare maintenance 01/12/2020   Tinnitus 05/11/2019   Skin lesion 07/19/2018   Trigger finger 07/19/2018   Family history of heart disease 07/19/2018   Syncope 03/17/2018   Advance care planning 08/07/2014   Radicular pain 08/21/2012   Medicare welcome exam 02/13/2012   ANEMIA, VITAMIN B12 DEFICIENCY 07/15/2009   Vitamin D deficiency 04/14/2009   BACK PAIN 08/16/2007   TESTOSTERONE DEFICIENCY 03/04/2007   Hyperlipidemia 03/04/2007   ERECTILE DYSFUNCTION 03/04/2007   HYPERTENSION, BENIGN ESSENTIAL 03/04/2007   GERD 03/04/2007   Diabetes mellitus without complication ([name]) 72/53/6644   BPH (benign prostatic hyperplasia) 03/01/2007   TOBACCO ABUSE, HX OF 03/01/2007   Have  you seen any other providers since your last visit? Yes  06/07/20- Dr. Sherri Todd- Gastroenterology. Colonoscopy.   Any changes in your medications or health? Yes  07/07/20 Dr. Damita Todd increased Metformin to 2 tablets in the morning and 1 in the evening.   Any side effects from any medications?  Denies any side effects to medications.  Do you have an symptoms or problems not managed by your medications? No  Any concerns about your health right now? No, not at this time.  Has your provider asked that you check blood pressure, blood sugar,  or follow special diet at home? He states he is supposed to check his blood sugar and supposed to follow a low sugar diet but does admit to having a hard time doing so. He is not checking his blood pressure.   Do you get any type of exercise on a regular basis? Denies any formal exercise but does state he is active around the house.  Can you think of a goal you would like to reach for your health? He does not know of any goals at this time.   Do you have any problems getting your medications?  No  Is there anything that you would like to discuss during the appointment? Not at this time.  Patient reminded to have all medications and supplements available for review with Thomas Todd, Pharm. D, at their telephone visit on 09/15/20.  States he lost his 23 year old daughter 4 years ago after having endoscopy.   Follow-Up:  Pharmacist Review   Thomas Todd, CPP notified  Thomas Todd, [place] Pharmacy Assistant [phone]

## 2020-09-09 ENCOUNTER — Other Ambulatory Visit: Payer: Self-pay

## 2020-09-09 ENCOUNTER — Other Ambulatory Visit (INDEPENDENT_AMBULATORY_CARE_PROVIDER_SITE_OTHER): Payer: PPO

## 2020-09-09 DIAGNOSIS — D518 Other vitamin B12 deficiency anemias: Secondary | ICD-10-CM | POA: Diagnosis not present

## 2020-09-09 DIAGNOSIS — E559 Vitamin D deficiency, unspecified: Secondary | ICD-10-CM

## 2020-09-09 DIAGNOSIS — E119 Type 2 diabetes mellitus without complications: Secondary | ICD-10-CM

## 2020-09-09 LAB — COMPREHENSIVE METABOLIC PANEL
ALT: 43 U/L (ref 0–53)
AST: 27 U/L (ref 0–37)
Albumin: 4.5 g/dL (ref 3.5–5.2)
Alkaline Phosphatase: 37 U/L — ABNORMAL LOW (ref 39–117)
BUN: 13 mg/dL (ref 6–23)
CO2: 31 mEq/L (ref 19–32)
Calcium: 9.7 mg/dL (ref 8.4–10.5)
Chloride: 102 mEq/L (ref 96–112)
Creatinine, Ser: 0.85 mg/dL (ref 0.40–1.50)
GFR: 90.06 mL/min (ref >60.00)
Glucose, Bld: 147 mg/dL — ABNORMAL HIGH (ref 70–99)
Potassium: 4.1 mEq/L (ref 3.5–5.1)
Sodium: 140 mEq/L (ref 135–145)
Total Bilirubin: 0.8 mg/dL (ref 0.2–1.2)
Total Protein: 6.5 g/dL (ref 6.0–8.3)

## 2020-09-09 LAB — VITAMIN B12: Vitamin B-12: 1003 pg/mL — ABNORMAL HIGH (ref 211–911)

## 2020-09-09 LAB — CBC WITH DIFFERENTIAL/PLATELET
Basophils Absolute: 0 10*3/uL (ref 0.0–0.1)
Basophils Relative: 0.5 % (ref 0.0–3.0)
Eosinophils Absolute: 0.2 10*3/uL (ref 0.0–0.7)
Eosinophils Relative: 2.5 % (ref 0.0–5.0)
HCT: 41 % (ref 39.0–52.0)
Hemoglobin: 13.8 g/dL (ref 13.0–17.0)
Lymphocytes Relative: 44.9 % (ref 12.0–46.0)
Lymphs Abs: 3.6 10*3/uL (ref 0.7–4.0)
MCHC: 33.6 g/dL (ref 30.0–36.0)
MCV: 84.3 fl (ref 78.0–100.0)
Monocytes Absolute: 0.8 10*3/uL (ref 0.1–1.0)
Monocytes Relative: 9.3 % (ref 3.0–12.0)
Neutro Abs: 3.5 10*3/uL (ref 1.4–7.7)
Neutrophils Relative %: 42.8 % — ABNORMAL LOW (ref 43.0–77.0)
Platelets: 198 10*3/uL (ref 150.0–400.0)
RBC: 4.87 Mil/uL (ref 4.22–5.81)
RDW: 13.8 % (ref 11.5–15.5)
WBC: 8.1 10*3/uL (ref 4.0–10.5)

## 2020-09-09 LAB — LIPID PANEL
Cholesterol: 184 mg/dL (ref 0–200)
HDL: 47.2 mg/dL (ref >39.00)
NonHDL: 136.58
Total CHOL/HDL Ratio: 4
Triglycerides: 213 mg/dL — ABNORMAL HIGH (ref 0.0–149.0)
VLDL: 42.6 mg/dL — ABNORMAL HIGH (ref 0.0–40.0)

## 2020-09-09 LAB — VITAMIN D 25 HYDROXY (VIT D DEFICIENCY, FRACTURES): VITD: 36.93 ng/mL (ref 30.00–100.00)

## 2020-09-09 LAB — HEMOGLOBIN A1C: Hgb A1c MFr Bld: 7.5 % — ABNORMAL HIGH (ref 4.6–6.5)

## 2020-09-09 LAB — LDL CHOLESTEROL, DIRECT: Direct LDL: 113 mg/dL

## 2020-09-09 LAB — TSH: TSH: 2.3 u[IU]/mL (ref 0.35–4.50)

## 2020-09-13 ENCOUNTER — Ambulatory Visit: Payer: PPO | Admitting: Family Medicine

## 2020-09-15 ENCOUNTER — Other Ambulatory Visit: Payer: Self-pay

## 2020-09-15 ENCOUNTER — Ambulatory Visit: Payer: PPO

## 2020-09-15 DIAGNOSIS — E785 Hyperlipidemia, unspecified: Secondary | ICD-10-CM

## 2020-09-15 DIAGNOSIS — E119 Type 2 diabetes mellitus without complications: Secondary | ICD-10-CM

## 2020-09-15 DIAGNOSIS — N400 Enlarged prostate without lower urinary tract symptoms: Secondary | ICD-10-CM

## 2020-09-15 NOTE — Chronic Care Management (AMB) (Signed)
Chronic Care Management Pharmacy  Name: Thomas Todd  MRN: [id number] DOB: September 07, 1951   Chief Complaint/ HPI  Thomas Todd,  69 y.o. , male presents for their Initial CCM visit with the clinical pharmacist via telephone. Reviewed initial questions from 09/07/20 per CMA. Pt denies concerns.  He states he is supposed to check his blood sugar and supposed to follow a low sugar diet but does admit to having a hard time doing so. He is not checking his blood pressure.   PCP : Tonia Ghent, MD  Their chronic conditions include: HTN, GERD, DM, BPH, VIT D deficiency, HLD, anemia, ED  Patient concerns: Trigger finger, thinks it's arthritis. Would like to know if there is anything he can take to loosen his joints. We discussed lifestyle changes including cool compress, massage, rest. Pt denies any pain.   Office Visits:  05/10/20: Damita Dunnings - referral GI ordered; B12 high, hgb stable, cut back on B12 to 1000 mcg daily; DM uncontrolled, increase metformin to 2 AM 1 PM  Consult Visit:   06/07/20: Colonoscopy   Allergies  Allergen Reactions   Atorvastatin     Myalgias   Medications: Outpatient Encounter Medications as of 09/15/2020  Medication Sig   Ascorbic Acid (VITAMIN C) 1000 MG tablet Take 1,000 mg by mouth daily.   aspirin EC 81 MG tablet Take 81 mg by mouth daily.   Blood Glucose Monitoring Suppl (ONE TOUCH ULTRA 2) w/Device KIT Use as instructed to check blood sugar daily as needed.  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   Cholecalciferol (VITAMIN D-3) 25 MCG (1000 UT) CAPS Take 2 capsules (2,000 Units total) by mouth daily.   docusate sodium (COLACE) 100 MG capsule Take 100 mg by mouth 2 (two) times daily.   esomeprazole (NEXIUM) 40 MG capsule TAKE 1 CAPSULE BY MOUTH DAILY AT 12 NOON   ezetimibe (ZETIA) 10 MG tablet TAKE 1 TABLET BY MOUTH ONCE DAILY   fluticasone (FLONASE) 50 MCG/ACT nasal spray PLACE 1 SPRAY INTO BOTH NOSTRILS TWICE DAILY   folic acid  (FOLVITE) 882 MCG tablet Take 400 mcg by mouth daily.   glucose blood (ONE TOUCH ULTRA TEST) test strip Use as instructed to check blood sugar daily as needed.  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   Lancets (ONETOUCH ULTRASOFT) lancets Use as instructed to check blood sugar daily as needed.  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   levocetirizine (XYZAL) 5 MG tablet Take 1 tablet (5 mg total) by mouth every evening.   lisinopril-hydrochlorothiazide (ZESTORETIC) 10-12.5 MG tablet Take 0.5 tablets by mouth daily.   metFORMIN (GLUCOPHAGE) 500 MG tablet Take 2 tablets in the morning and 1 tablet in the evening.   Omega-3 Fatty Acids (FISH OIL PO) Take 2,400 mg by mouth daily.   potassium chloride (KLOR-CON) 10 MEQ tablet TAKE 1 TABLET BY MOUTH 2 TIMES DAILY   pravastatin (PRAVACHOL) 20 MG tablet TAKE 1 TABLET BY MOUTH DAILY   tamsulosin (FLOMAX) 0.4 MG CAPS capsule Take 1 capsule (0.4 mg total) by mouth daily.   vitamin B-12 (CYANOCOBALAMIN) 1000 MCG tablet Take 1 tablet (1,000 mcg total) by mouth daily.   No facility-administered encounter medications on file as of 09/15/2020.    Current Diagnosis/Assessment:  SDOH Interventions   Flowsheet Row Most Recent Value  SDOH Interventions   Financial Strain Interventions Intervention Not Indicated     Goals Addressed            This  Visit's Progress    Pharmacy Care Plan       CARE PLAN ENTRY (see longitudinal plan of care for additional care plan information)  Current Barriers:   Chronic Disease Management support, education, and care coordination needs related to Hyperlipidemia, Diabetes, and BPH   Hyperlipidemia Lab Results  Component Value Date/Time   LDLCALC 75 01/01/2020 07:34 AM   LDLDIRECT 113.0 09/09/2020 08:28 AM    Pharmacist Clinical Goal(s): o Over the next 30 days, patient will work with PharmD and providers to achieve LDL goal < 100  Current regimen:   Pravastatin 20 mg - 1 tablet  daily  Zetia 10 mg - 1 tablet daily   Interventions: o Recommend increasing pravastatin to 40 mg daily - consult PCP  Patient self care activities - Over the next 30 days, patient will: o Continue daily adherence to medications  Diabetes Lab Results  Component Value Date/Time   HGBA1C 7.5 (H) 09/09/2020 08:28 AM   HGBA1C 8.1 (H) 05/03/2020 07:53 AM    Pharmacist Clinical Goal(s): o Over the next 30 days, patient will work with PharmD and providers to achieve A1c goal <7%  Current regimen:   Metformin 500 mg - 1 tablet AM, 2 tablets in the evening   Interventions: o Recommend increasing metformin to 2 tablets twice daily - consult PCP  Patient self care activities - Over the next 30 days, patient will: o Check blood sugar once daily, document, and provide at future appointments o Contact provider with any episodes of hypoglycemia  BPH  Pharmacist Clinical Goal(s) o Over the next 30 days, patient will work with PharmD and providers to improve frequent urination  Current regimen:  o Tamsulosin 0.4 mg - 1 capsule daily  Interventions: o Reviewed medication efficacy - pt denies any benefit, recommend discontinuation of Flomax  o Will let Dr. Damita Dunnings know of patient interest in Cialis  Patient self care activities - Over the next 30 days, patient will: o Continue current medications and discuss with Dr. Damita Dunnings your interest in Cialis for BPH and ED symptoms.   Initial goal documentation       Hypertension   CMP Latest Ref Rng & Units 09/09/2020 01/01/2020 11/05/2018  Glucose 70 - 99 mg/dL 147(H) 129(H) 149(H)  BUN 6 - 23 mg/dL _0 Creatinine 0.40 - 1.50 mg/dL 0.85 0.78 0.88  Sodium 135 - 145 mEq/L 140 144 140  Potassium 3.5 - 5.1 mEq/L 4.1 3.7 4.9  Chloride 96 - 112 mEq/L 102 106 103  CO2 19 - 32 mEq/L _1 Calcium 8.4 - 10.5 mg/dL 9.7 9.2 9.7  Total Protein 6.0 - 8.3 g/dL 6.5 5.6(L) 6.3  Total Bilirubin 0.2 - 1.2 mg/dL 0.8 0.4 0.9  Alkaline Phos 39 -  117 U/L 37(L) 37(L) 40  AST 0 - 37 U/L _2 ALT 0 - 53 U/L 43 19 37   Office blood pressures are: BP Readings from Last 3 Encounters:  06/07/20 (!) 149/87  05/10/20 136/78  01/08/20 (!) 156/84   BP goal < 140/90 mmHg Patient has failed these meds in the past: none reported  Patient checks BP at home: has an automatic arm cuff, checks often  Patient home BP readings are ranging: 134/78, 96, 138/80, 98, 139/82, 101, 127/72, 89  Patient is currently controlled on the following medications:   Lisinopril/HCTZ 10/12.5 mg - 1/2 tablet daily   Potassium chloride 10 mEq - 1 tablet BID   We discussed: BP  within goal at home, HR elevated. Denies any palpitations. Reports some increased stress lately may have his HR up.  Plan: Continue current medications  Hyperlipidemia   LDL goal < 100  Last lipids Lab Results  Component Value Date   CHOL 184 09/09/2020   HDL 47.20 09/09/2020   LDLCALC 75 01/01/2020   LDLDIRECT 113.0 09/09/2020   TRIG 213.0 (H) 09/09/2020   CHOLHDL 4 09/09/2020   Hepatic Function Latest Ref Rng & Units 09/09/2020 01/01/2020 11/05/2018  Total Protein 6.0 - 8.3 g/dL 6.5 5.6(L) 6.3  Albumin 3.5 - 5.2 g/dL 4.5 4.2 4.5  AST 0 - 37 U/L _0 ALT 0 - 53 U/L 43 19 37  Alk Phosphatase 39 - 117 U/L 37(L) 37(L) 40  Total Bilirubin 0.2 - 1.2 mg/dL 0.8 0.4 0.9  Bilirubin, Direct 0.0 - 0.3 mg/dL - - 0.1    The 10-year ASCVD risk score Mikey Bussing DC Jr., et al., 2013) is: 37.1%   Values used to calculate the score:     Age: 31 years     Sex: Male     Is Non-Hispanic African American: No     Diabetic: Yes     Tobacco smoker: No     Systolic Blood Pressure: 476 mmHg     Is BP treated: Yes     HDL Cholesterol: 47.2 mg/dL     Total Cholesterol: 184 mg/dL   Patient has failed these meds in past: atorvastatin - myalgia Patient is currently controlled on the following medications:   Pravastatin 20 mg - 1 tablet daily  Zetia 10 mg - 1 tablet daily   We discussed:   Discussed elevated LDL from previous labs to most recent. Pt is open to dose change or retrial of higher intensity statin. Recommend increasing pravastatin to 40 mg daily.  Plan: Recommend increasing pravastatin to 40 mg daily.  Diabetes   Lab Results  Component Value Date   CREATININE 0.85 09/09/2020   BUN 13 09/09/2020   GFR 90.06 09/09/2020   GFRNONAA 86.77 09/09/2010   GFRAA 113 04/03/2008   NA 140 09/09/2020   K 4.1 09/09/2020   CALCIUM 9.7 09/09/2020   CO2 31 09/09/2020   Recent Relevant Labs: Lab Results  Component Value Date/Time   HGBA1C 7.5 (H) 09/09/2020 08:28 AM   HGBA1C 8.1 (H) 05/03/2020 07:53 AM   MICROALBUR 2.5 (H) 09/09/2010 08:27 AM   MICROALBUR 0.2 04/09/2009 08:39 AM    Checking BG: daily in the morning most days Average - 150-170s 12/29 - 149  12/28 - 187  12/27 - 134 12/26 - 196 12/25 - 158 12/23 - 132 12/22 - 185 12/21 -  278 (dessert)  Denies any BG less than 70 Patient has failed these meds in past: none  Patient is currently uncontrolled on the following medications:   Metformin 500 mg - 1 tablet AM, 2 tablets in the evening   Lab Results  Component Value Date/Time   HMDIABEYEEXA No Retinopathy 04/14/2019 12:00 AM    Pt reports he really needs to adjust his lifestyle. He likes to have sweets every now and then and may over do it. Same things with meals. Tries to limit portions. Always doing chores around house but not walking treadmill. Weight fluctuates from 180-189 lbs. Reports trying more than he was a year ago. Drinks: Diet soda, uses sweet in low in coffee. Denies sweet tea or juices. No alcohol. Denies cigarettes. Previously used 3 PPD. Using 3 mg nicotine vape  all day now. We discussed A1c and fasting BG readings above goal - pt is open to increasing metformin to 2 tablets twice daily.  Plan: Recommend increasing metformin to 2 tablets BID.   GERD   Patient has failed these meds in past: none reported Patient is currently  controlled on the following medications:   Esomeprazole 40 mg DR - 1 tablet daily before breakfast  We discussed: Patient is satisfied with current regimen and denies issues. Denies any breakthrough symptoms.  Plan: Continue current therapy  BPH   PSA  Date Value Ref Range Status  01/01/2020 0.64 0.10 - 4.00 ng/ml Final    Comment:    Test performed using Access Hybritech PSA Assay, a parmagnetic partical, chemiluminecent immunoassay.  11/05/2018 0.56 0.10 - 4.00 ng/ml Final    Comment:    Test performed using Access Hybritech PSA Assay, a parmagnetic partical, chemiluminecent immunoassay.  05/03/2017 0.68 0.10 - 4.00 ng/mL Final    Comment:    Test performed using Access Hybritech PSA Assay, a parmagnetic partical, chemiluminecent immunoassay.   Patient has failed these meds in past: none Patient is currently uncontrolled on the following medications:   Tamsulosin 0.4 mg - 1 capsule daily   We discussed:  Pt doesn't feel like Flomax is working for him. Started 01/12/2020. Reports he read online about Cialis for ED and BPH symptoms. He tried Viagra in the past and had a headache but would like to try Cialis. Reports the following BPH symptoms - frequent urination during the day, waking up at least once at night, and incomplete emptying. He does sit down to urinate. We discussed finasteride. Pt reports he has not tried this. Will relay pt desire to discuss Cialis with PCP.  Plan: Recommend discontinuing tamsulosin and considering alternative therapy - Cialis.   Medication Management   Wife organizes his medication into pillbox  Misc meds/OTCs: Confirmed all of the below   Vitamin C 1000 mg - 1 daily  Aspirin 81 mg - 1 daily  Vitamin D3 25 mcg - 1 daily  Docusate 100 mg - 1 daily  Flonase nasal spray - 1 daily  Folic acid 440 mg - 1 tablet daily  Xyzal 5 mg - 1 daily  Vitamin B12 1000 mcg - 1 daily   Fish Oil 1200 mg - 1 capsule BID  Patient's preferred pharmacy  is:  [facility], [place] [address] [place] [address] Phone: [phone] Fax: [phone]  Uses pill box? Yes Pt endorses 100% compliance  Plan  Continue current medication management strategy  Follow up: 3 month telephone visit (12/14/20 at 8:30 AM); CMA call in 1 month to review BG log and tolerance of any med changes  [name], PharmD Clinical Pharmacist [facility] at [facility] [phone]

## 2020-09-16 ENCOUNTER — Telehealth: Payer: Self-pay

## 2020-09-16 NOTE — Telephone Encounter (Signed)
PCP consult regarding CCM 09/15/20:  Reviewed and discussed the following recommendations with patient. He has an appt with PCP on Monday, 09/20/20.  1. Recommend increasing pravastatin to 40 mg due to LDL increase to 113 and very high ASCVD risk score. Pt is agreeable to this change.  Lab Results  Component Value Date   CHOL 184 09/09/2020   HDL 47.20 09/09/2020   LDLCALC 75 01/01/2020   LDLDIRECT 113.0 09/09/2020   TRIG 213.0 (H) 09/09/2020   CHOLHDL 4 09/09/2020   2.  Recommend increasing metformin 500 mg to 2 tablets BID. Pt agreeable to this change and would like to see his fasting BG come down. Kidney function is normal.  Recent fasting BG: 12/29 - 149  12/28 - 187  12/27 - 134 12/26 - 196 12/25 - 158 12/23 - 132 12/22 - 185 12/21 -  278 (dessert)  Lab Results  Component Value Date   CREATININE 0.85 09/09/2020   BUN 13 09/09/2020   GFR 90.06 09/09/2020   GFRNONAA 86.77 09/09/2010   GFRAA 113 04/03/2008   NA 140 09/09/2020   K 4.1 09/09/2020   CALCIUM 9.7 09/09/2020   CO2 31 09/09/2020   Lab Results  Component Value Date/Time   HGBA1C 7.5 (H) 09/09/2020 08:28 AM   HGBA1C 8.1 (H) 05/03/2020 07:53 AM   3. Pt is interesting in discussing Cialis for BPH and ED. He saw something online about it. Reports Flomax is not working (started April 2021). I told him he could discuss this with you at his appt next week.  Thanks,  Phil Dopp, PharmD Clinical Pharmacist [facility] at [facility] [phone]

## 2020-09-16 NOTE — Patient Instructions (Addendum)
Dear Thomas Todd,  Below is a summary of the goals we discussed during our follow up appointment on September 16, 2020. Please contact me anytime with questions or concerns.   Visit Information  Goals Addressed            This Visit's Progress   . Pharmacy Care Plan       CARE PLAN ENTRY (see longitudinal plan of care for additional care plan information)  Current Barriers:  . Chronic Disease Management support, education, and care coordination needs related to Hyperlipidemia, Diabetes, and BPH   Hyperlipidemia Lab Results  Component Value Date/Time   LDLCALC 75 01/01/2020 07:34 AM   LDLDIRECT 113.0 09/09/2020 08:28 AM   . Pharmacist Clinical Goal(s): o Over the next 30 days, patient will work with PharmD and providers to achieve LDL goal < 100 . Current regimen:  . Pravastatin 20 mg - 1 tablet daily . Zetia 10 mg - 1 tablet daily  . Interventions: o Recommend increasing pravastatin to 40 mg daily - consult PCP . Patient self care activities - Over the next 30 days, patient will: o Continue daily adherence to medications  Diabetes Lab Results  Component Value Date/Time   HGBA1C 7.5 (H) 09/09/2020 08:28 AM   HGBA1C 8.1 (H) 05/03/2020 07:53 AM   . Pharmacist Clinical Goal(s): o Over the next 30 days, patient will work with PharmD and providers to achieve A1c goal <7% . Current regimen:   Metformin 500 mg - 1 tablet AM, 2 tablets in the evening  . Interventions: o Recommend increasing metformin to 2 tablets twice daily - consult PCP . Patient self care activities - Over the next 30 days, patient will: o Check blood sugar once daily, document, and provide at future appointments o Contact provider with any episodes of hypoglycemia  BPH . Pharmacist Clinical Goal(s) o Over the next 30 days, patient will work with PharmD and providers to improve frequent urination . Current regimen:  o Tamsulosin 0.4 mg - 1 capsule daily . Interventions: o Reviewed medication  efficacy - pt denies any benefit, recommend discontinuation of Flomax  o Will let Dr. Damita Dunnings know of patient interest in Cialis . Patient self care activities - Over the next 30 days, patient will: o Continue current medications and discuss with Dr. Damita Dunnings your interest in Cialis for BPH and ED symptoms.   Initial goal documentation        Thomas Todd was given information about Chronic Care Management services today including:  1. CCM service includes personalized support from designated clinical staff supervised by his physician, including individualized plan of care and coordination with other care providers 2. 24/7 contact phone numbers for assistance for urgent and routine care needs. 3. Standard insurance, coinsurance, copays and deductibles apply for chronic care management only during months in which we provide at least 20 minutes of these services. Most insurances cover these services at 100%, however patients may be responsible for any copay, coinsurance and/or deductible if applicable. This service may help you avoid the need for more expensive face-to-face services. 4. Only one practitioner may furnish and bill the service in a calendar month. 5. The patient may stop CCM services at any time (effective at the end of the month) by phone call to the office staff.  Patient agreed to services and verbal consent obtained.   The patient verbalized understanding of instructions, educational materials, and care plan provided today and agreed to receive a mailed copy of patient instructions, educational  materials, and care plan.  Telephone follow up appointment with pharmacy team member scheduled for: 12/14/2020 at 8:30 AM (phone call); A team member will check in at the end of January to review blood glucose log and tolerance of any medication changes.  [name], PharmD Clinical Pharmacist [facility] at [facility] [phone]   Diabetes Mellitus and  Exercise Exercising regularly is important for your overall health, especially when you have diabetes (diabetes mellitus). Exercising is not only about losing weight. It has many other health benefits, such as increasing muscle strength and bone density and reducing body fat and stress. This leads to improved fitness, flexibility, and endurance, all of which result in better overall health. Exercise has additional benefits for people with diabetes, including:  Reducing appetite.  Helping to lower and control blood glucose.  Lowering blood pressure.  Helping to control amounts of fatty substances (lipids) in the blood, such as cholesterol and triglycerides.  Helping the body to respond better to insulin (improving insulin sensitivity).  Reducing how much insulin the body needs.  Decreasing the risk for heart disease by: ? Lowering cholesterol and triglyceride levels. ? Increasing the levels of good cholesterol. ? Lowering blood glucose levels. What is my activity plan? Your health care provider or certified diabetes educator can help you make a plan for the type and frequency of exercise (activity plan) that works for you. Make sure that you:  Do at least 150 minutes of moderate-intensity or vigorous-intensity exercise each week. This could be brisk walking, biking, or water aerobics. ? Do stretching and strength exercises, such as yoga or weightlifting, at least 2 times a week. ? Spread out your activity over at least 3 days of the week.  Get some form of physical activity every day. ? Do not go more than 2 days in a row without some kind of physical activity. ? Avoid being inactive for more than 30 minutes at a time. Take frequent breaks to walk or stretch.  Choose a type of exercise or activity that you enjoy, and set realistic goals.  Start slowly, and gradually increase the intensity of your exercise over time. What do I need to know about managing my diabetes?   Check your  blood glucose before and after exercising. ? If your blood glucose is 240 mg/dL (13.3 mmol/L) or higher before you exercise, check your urine for ketones. If you have ketones in your urine, do not exercise until your blood glucose returns to normal. ? If your blood glucose is 100 mg/dL (5.6 mmol/L) or lower, eat a snack containing 15-20 grams of carbohydrate. Check your blood glucose 15 minutes after the snack to make sure that your level is above 100 mg/dL (5.6 mmol/L) before you start your exercise.  Know the symptoms of low blood glucose (hypoglycemia) and how to treat it. Your risk for hypoglycemia increases during and after exercise. Common symptoms of hypoglycemia can include: ? Hunger. ? Anxiety. ? Sweating and feeling clammy. ? Confusion. ? Dizziness or feeling light-headed. ? Increased heart rate or palpitations. ? Blurry vision. ? Tingling or numbness around the mouth, lips, or tongue. ? Tremors or shakes. ? Irritability.  Keep a rapid-acting carbohydrate snack available before, during, and after exercise to help prevent or treat hypoglycemia.  Avoid injecting insulin into areas of the body that are going to be exercised. For example, avoid injecting insulin into: ? The arms, when playing tennis. ? The legs, when jogging.  Keep records of your exercise habits.  Doing this can help you and your health care provider adjust your diabetes management plan as needed. Write down: ? Food that you eat before and after you exercise. ? Blood glucose levels before and after you exercise. ? The type and amount of exercise you have done. ? When your insulin is expected to peak, if you use insulin. Avoid exercising at times when your insulin is peaking.  When you start a new exercise or activity, work with your health care provider to make sure the activity is safe for you, and to adjust your insulin, medicines, or food intake as needed.  Drink plenty of water while you exercise to prevent  dehydration or heat stroke. Drink enough fluid to keep your urine clear or pale yellow. Summary  Exercising regularly is important for your overall health, especially when you have diabetes (diabetes mellitus).  Exercising has many health benefits, such as increasing muscle strength and bone density and reducing body fat and stress.  Your health care provider or certified diabetes educator can help you make a plan for the type and frequency of exercise (activity plan) that works for you.  When you start a new exercise or activity, work with your health care provider to make sure the activity is safe for you, and to adjust your insulin, medicines, or food intake as needed. This information is not intended to replace advice given to you by your health care provider. Make sure you discuss any questions you have with your health care provider. Document Revised: 03/29/2017 Document Reviewed: 02/14/2016 Elsevier Patient Education  2020 [employer].

## 2020-09-19 DIAGNOSIS — R5383 Other fatigue: Secondary | ICD-10-CM | POA: Diagnosis not present

## 2020-09-19 DIAGNOSIS — Z03818 Encounter for observation for suspected exposure to other biological agents ruled out: Secondary | ICD-10-CM | POA: Diagnosis not present

## 2020-09-19 DIAGNOSIS — R110 Nausea: Secondary | ICD-10-CM | POA: Diagnosis not present

## 2020-09-19 DIAGNOSIS — K529 Noninfective gastroenteritis and colitis, unspecified: Secondary | ICD-10-CM | POA: Diagnosis not present

## 2020-09-19 NOTE — Telephone Encounter (Signed)
Noted. Thanks. Will d/w pt at OV.  ?

## 2020-09-20 ENCOUNTER — Telehealth: Payer: Self-pay

## 2020-09-20 ENCOUNTER — Encounter: Payer: Self-pay | Admitting: Family Medicine

## 2020-09-20 ENCOUNTER — Telehealth (INDEPENDENT_AMBULATORY_CARE_PROVIDER_SITE_OTHER): Payer: PPO | Admitting: Family Medicine

## 2020-09-20 DIAGNOSIS — E785 Hyperlipidemia, unspecified: Secondary | ICD-10-CM

## 2020-09-20 DIAGNOSIS — E119 Type 2 diabetes mellitus without complications: Secondary | ICD-10-CM

## 2020-09-20 DIAGNOSIS — N400 Enlarged prostate without lower urinary tract symptoms: Secondary | ICD-10-CM

## 2020-09-20 MED ORDER — METFORMIN HCL 500 MG PO TABS: ORAL_TABLET | ORAL | 3 refills | Status: DC

## 2020-09-20 MED ORDER — TADALAFIL 5 MG PO TABS: 5.0000 mg | ORAL_TABLET | Freq: Every day | ORAL | 5 refills | Status: DC

## 2020-09-20 MED ORDER — PRAVASTATIN SODIUM 20 MG PO TABS: 40.0000 mg | ORAL_TABLET | Freq: Every day | ORAL | Status: DC

## 2020-09-20 NOTE — Progress Notes (Signed)
Interactive audio and video telecommunications were attempted between this provider and patient, however failed, due to patient having technical difficulties OR patient did not have access to video capability.  We continued and completed visit with audio only.   Virtual Visit via Telephone Note  I connected with patient on 09/20/20  at 10:57 AM  by telephone and verified that I am speaking with the correct person using two identifiers.  Location of patient:  Home.  Location of [facility] Name of referring provider (if blank then none associated): Names per persons and role in encounter:  MD: Ferd Hibbs, Patient: name listed above.    I discussed the limitations, risks, security and privacy concerns of performing an evaluation and management service by telephone and the availability of in person appointments. I also discussed with the patient that there may be a patient responsible charge related to this service. The patient expressed understanding and agreed to proceed.  CC: follow up  History of Present Illness:   Discussed lipid panel and taking pravastatin 20 mg a day.  He has tolerated this so far.  Discussed trying to increase to 40 mg.  See below.  Lower urinary tract symptoms.  Flomax did not help.  Discussed Cialis start and stopping flomax.  Routine cautions given to patient regarding Cialis.  Discussed diabetes medication options.  Recommend increasing metformin 500 mg to 2 tablets BID. Pt agreeable to this change and would like to see his fasting BG come down. Kidney function is normal.  Recent labs discussed with patient.   Recent fasting BG:  12/29 - 149  12/28 - 187  12/27 - 134  12/26 - 196  12/25 - 158  12/23 - 132  12/22 - 185  12/21 - 278 (dessert)    Component Value Date   CREATININE 0.85 09/09/2020    Component Value Date/Time   HGBA1C 7.5 (H) 09/09/2020 08:28 AM   HGBA1C 8.1 (H) 05/03/2020 07:53 AM   He is working on diet, d/w pt.  He  has been more active.    Observations/Objective: nad Speech wnl.    Assessment and Plan:  His cough is clearly better.  No fevers.  He tested neg for covid.  He is clearly better in the meantime.  He had covid vaccine.  After cough resolved, discussed making the following changes.    Lower urinary tract symptoms/BPH.   Stop flomax.   Start cialis.   rx sent to pharmacy.    Diabetes.Marland Kitchen  He can then try to increase metformin to 2 tabs twice a day.    Hyperlipidemia.  Then try to increase pravastatin to 40mg .  Routine cautions given to patient.  He will let me know if he is having muscle aches.  Recheck in 3 months.  Update me as needed.    Follow Up Instructions: see above.    I discussed the assessment and treatment plan with the patient. The patient was provided an opportunity to ask questions and all were answered. The patient agreed with the plan and demonstrated an understanding of the instructions.   The patient was advised to call back or seek an in-person evaluation if the symptoms worsen or if the condition fails to improve as anticipated.  I provided 24 minutes of non-face-to-face time during this encounter.  , MD

## 2020-09-20 NOTE — Telephone Encounter (Signed)
pts wife (DPR signed) said pt last diarrhea one wk;prod cough with clear phlegm; pt has SOB upon exertion that started 1 wk ago.No vomiting; pt had rapid [facility] flu [facility]19 that was neg. Pt said was dx with stomach flu. No other covid symptoms and no fever. Pt changed 4 mth FU today to video visit.

## 2020-09-20 NOTE — Telephone Encounter (Signed)
[facility] Night - Client TELEPHONE ADVICE RECORD [facility] Patient Name: Thomas Todd Gender: Male DOB: [birth year] Age: 70 Y 3 M 20 D Return Phone Number: [phone] (Primary) Address: City/State/Zip: [place] [address] Client [facility] Night - Client Client Site [facility] - Night Physician Raechel Ache - MD Contact Type Call Who Is Calling Patient / Member / Family / Caregiver Call Type Triage / Clinical Caller Name [place] Relationship To Patient Spouse Return Phone Number [phone] (Primary) Chief Complaint Fatigue (>THREE MONTHS) Reason for Call Symptomatic / Request for Health Information Initial Comment Caller states both her and her husband have appts tomorrow. They have both been diagnosed with stomach flu and is wanting to make sure it is okay for them to come in. He has fatigue, nausea, and diarrhea Nurse Assessment Nurse: Melanee Spry, RN, Beth Date/Time (Eastern Time): 09/19/2020 3:00:42 PM Confirm and document reason for call. If symptomatic, describe symptoms. ---2/2 Caller states both her and her husband have appts tomorrow. They have both been diagnosed with stomach flu and is wanting to make sure it is okay for them to come in. He has fatigue, nausea, and diarrhea (none today) Has not vomited. Denies fever. Does the patient have any new or worsening symptoms? ---Yes Will a triage be completed? ---Yes Related visit to physician within the last 2 weeks? ---No Does the PT have any chronic conditions? (i.e. diabetes, asthma, this includes High risk factors for pregnancy, etc.) ---Yes List chronic conditions. ---DM, HTN Is this a behavioral health or substance abuse call? ---No Guidelines Guideline Title Affirmed Question Affirmed Notes Nurse Date/Time (Eastern Time) Nausea Nausea lasts > 1 week Newhart, RN, [facility] 09/19/2020 3:08:38 PM Disp. Time Lamount Cohen Time) Disposition Final  User 09/19/2020 3:10:06 PM SEE PCP WITHIN 3 DAYS Yes Newhart, RN, Beth PLEASE NOTE: All timestamps contained within this report are represented as [place] Standard Time. CONFIDENTIALTY NOTICE: This fax transmission is intended only for the addressee. It contains information that is legally privileged, confidential or otherwise protected from use or disclosure. If you are not the intended recipient, you are strictly prohibited from reviewing, disclosing, copying using or disseminating any of this information or taking any action in reliance on or regarding this information. If you have received this fax in error, please notify us immediately by telephone so that we can arrange for its return to [place]. Phone: [phone], Toll-Free: [phone], Fax: [phone] Page: 2 of 2 Call Id: [id number] Caller Disagree/Comply Comply Caller Understands Yes PreDisposition Home Care Care Advice Given Per Guideline SEE PCP WITHIN 3 DAYS: * You need to be seen within 2 or 3 days. CLEAR FLUIDS: * Take clear fluids in small amounts until the nausea is resolved for 8 hours: * Sip water or rehydration liquid (Gatorade or Powerade) * Start with saltine crackers, white bread, rice, mashed potatoes, cereal, apple sauce etc. CALL BACK IF: * You become worse CARE ADVICE given per Nausea (Adult) guideline. Referrals REFERRED TO PCP OFFICE

## 2020-09-28 NOTE — Assessment & Plan Note (Signed)
  Hyperlipidemia.  Then try to increase pravastatin to 40mg .  Routine cautions given to patient.  He will let me know if he is having muscle aches.  Recheck in 3 months.  Update me as needed.

## 2020-09-28 NOTE — Assessment & Plan Note (Signed)
   Diabetes.Marland Kitchen  He can then try to increase metformin to 2 tabs twice a day.

## 2020-09-28 NOTE — Assessment & Plan Note (Signed)
Stop flomax.   Start cialis.   rx sent to pharmacy.

## 2020-10-01 ENCOUNTER — Encounter: Payer: Self-pay | Admitting: Family Medicine

## 2020-10-23 ENCOUNTER — Encounter: Payer: Self-pay | Admitting: Family Medicine

## 2020-10-26 ENCOUNTER — Telehealth: Payer: Self-pay | Admitting: *Deleted

## 2020-10-26 ENCOUNTER — Emergency Department
Admission: EM | Admit: 2020-10-26 | Discharge: 2020-10-26 | Disposition: A | Discharge status: Home / Self Care | Payer: PPO | Attending: Emergency Medicine | Admitting: Emergency Medicine

## 2020-10-26 ENCOUNTER — Encounter: Payer: Self-pay | Admitting: Emergency Medicine

## 2020-10-26 ENCOUNTER — Emergency Department: Payer: PPO

## 2020-10-26 DIAGNOSIS — Z87891 Personal history of nicotine dependence: Secondary | ICD-10-CM | POA: Diagnosis not present

## 2020-10-26 DIAGNOSIS — R112 Nausea with vomiting, unspecified: Secondary | ICD-10-CM

## 2020-10-26 DIAGNOSIS — Z794 Long term (current) use of insulin: Secondary | ICD-10-CM | POA: Insufficient documentation

## 2020-10-26 DIAGNOSIS — Z85038 Personal history of other malignant neoplasm of large intestine: Secondary | ICD-10-CM | POA: Insufficient documentation

## 2020-10-26 DIAGNOSIS — S022XXA Fracture of nasal bones, initial encounter for closed fracture: Secondary | ICD-10-CM | POA: Diagnosis not present

## 2020-10-26 DIAGNOSIS — Z79899 Other long term (current) drug therapy: Secondary | ICD-10-CM | POA: Insufficient documentation

## 2020-10-26 DIAGNOSIS — S0990XA Unspecified injury of head, initial encounter: Secondary | ICD-10-CM | POA: Diagnosis not present

## 2020-10-26 DIAGNOSIS — Z043 Encounter for examination and observation following other accident: Secondary | ICD-10-CM | POA: Diagnosis not present

## 2020-10-26 DIAGNOSIS — R220 Localized swelling, mass and lump, head: Secondary | ICD-10-CM | POA: Diagnosis not present

## 2020-10-26 DIAGNOSIS — W19XXXD Unspecified fall, subsequent encounter: Secondary | ICD-10-CM | POA: Insufficient documentation

## 2020-10-26 DIAGNOSIS — E119 Type 2 diabetes mellitus without complications: Secondary | ICD-10-CM | POA: Diagnosis not present

## 2020-10-26 DIAGNOSIS — Z7982 Long term (current) use of aspirin: Secondary | ICD-10-CM | POA: Insufficient documentation

## 2020-10-26 DIAGNOSIS — S022XXK Fracture of nasal bones, subsequent encounter for fracture with nonunion: Secondary | ICD-10-CM | POA: Diagnosis not present

## 2020-10-26 DIAGNOSIS — I10 Essential (primary) hypertension: Secondary | ICD-10-CM | POA: Diagnosis not present

## 2020-10-26 DIAGNOSIS — Z7984 Long term (current) use of oral hypoglycemic drugs: Secondary | ICD-10-CM | POA: Insufficient documentation

## 2020-10-26 DIAGNOSIS — R55 Syncope and collapse: Secondary | ICD-10-CM | POA: Insufficient documentation

## 2020-10-26 DIAGNOSIS — S199XXA Unspecified injury of neck, initial encounter: Secondary | ICD-10-CM | POA: Diagnosis not present

## 2020-10-26 LAB — URINALYSIS, COMPLETE (UACMP) WITH MICROSCOPIC
Bacteria, UA: NONE SEEN
Bilirubin Urine: NEGATIVE
Glucose, UA: NEGATIVE mg/dL
Hgb urine dipstick: NEGATIVE
Ketones, ur: NEGATIVE mg/dL
Leukocytes,Ua: NEGATIVE
Nitrite: NEGATIVE
Protein, ur: NEGATIVE mg/dL
Specific Gravity, Urine: 1.01 (ref 1.005–1.030)
pH: 6 (ref 5.0–8.0)

## 2020-10-26 LAB — BASIC METABOLIC PANEL
Anion gap: 12 (ref 5–15)
BUN: 9 mg/dL (ref 8–23)
CO2: 25 mmol/L (ref 22–32)
Calcium: 9.9 mg/dL (ref 8.9–10.3)
Chloride: 102 mmol/L (ref 98–111)
Creatinine, Ser: 0.76 mg/dL (ref 0.61–1.24)
GFR, Estimated: >60 mL/min (ref >60)
Glucose, Bld: 168 mg/dL — ABNORMAL HIGH (ref 70–99)
Potassium: 3.9 mmol/L (ref 3.5–5.1)
Sodium: 139 mmol/L (ref 135–145)

## 2020-10-26 LAB — CBC
HCT: 36.5 % — ABNORMAL LOW (ref 39.0–52.0)
Hemoglobin: 12.4 g/dL — ABNORMAL LOW (ref 13.0–17.0)
MCH: 28.3 pg (ref 26.0–34.0)
MCHC: 34 g/dL (ref 30.0–36.0)
MCV: 83.3 fL (ref 80.0–100.0)
Platelets: 205 10*3/uL (ref 150–400)
RBC: 4.38 MIL/uL (ref 4.22–5.81)
RDW: 13 % (ref 11.5–15.5)
WBC: 12.4 10*3/uL — ABNORMAL HIGH (ref 4.0–10.5)
nRBC: 0 % (ref 0.0–0.2)

## 2020-10-26 LAB — TROPONIN I (HIGH SENSITIVITY): Troponin I (High Sensitivity): 9 ng/L (ref <18)

## 2020-10-26 MED ORDER — LACTATED RINGERS IV BOLUS
1000.0000 mL | Freq: Once | INTRAVENOUS | Status: AC
  Administered 2020-10-26: 1000 mL via INTRAVENOUS

## 2020-10-26 MED ORDER — ONDANSETRON HCL 4 MG/2ML IJ SOLN
4.0000 mg | Freq: Once | INTRAMUSCULAR | Status: AC
  Administered 2020-10-26: 4 mg via INTRAVENOUS
  Filled 2020-10-26: qty 2

## 2020-10-26 NOTE — ED Triage Notes (Addendum)
Pt in after syncopal fall at 0300 this am, sent by his doctor. Pt states he went to bed feeling normal, and sharp lower abdominal pain woke him up suddenly, w/urgency to go to have diarrhea. Felt dizzy on walk to bathroom, and syncopized before reaching commode. Has bruising to R eyebrow, no thinners. Denies any HA, neck or other pain today. Did have emesis episode after event last night. BP 175/99 in triage. Of note pt is diabetic, unsure of his sugar last night during event

## 2020-10-26 NOTE — Telephone Encounter (Signed)
Agreed.  Thanks. Will await ER notes.

## 2020-10-26 NOTE — ED Provider Notes (Signed)
[facility] Emergency Department Provider Note   ____________________________________________   Event Date/Time   First MD Initiated Contact with Patient 10/26/20 1234     (approximate)  I have reviewed the triage vital signs and the nursing notes.   HISTORY  Chief Complaint Loss of Consciousness and Head Injury    HPI Thomas Todd is a 70 y.o. male with past medical history of hypertension, hyperlipidemia, diabetes, and GERD who presents to the ED complaining of syncope.  Patient reports that he woke up around 3:00 this morning feeling nauseous with significant cramping around his lower abdomen and rectum.  He went to the bathroom, where he vomited multiple times and felt like he was going to have diarrhea, but never passed any stool.  He then began to feel very lightheaded and sweaty, eventually passed out.  He states he had one similar episode about 2 years ago, when he was seen by cardiology and thought to have vasovagal etiology of his syncope.  He denies any chest pain or shortness of breath with the episode today.  His abdominal pain has since resolved but he continues to have some mild nausea.  He is otherwise felt well recently with no fevers or cough.        Past Medical History:  Diagnosis Date  . Allergy   . Anemia   . Arthritis   . Diabetes mellitus without complication ([place])    no medications taken 05-04-17  . Dyspnea   . GERD (gastroesophageal reflux disease)   . Hyperlipidemia   . Hypertension   . LBP (low back pain)    with sciatica   . Pancreatitis    Pancreatitis etoh 08/1998    Patient Active Problem List   Diagnosis Date Noted  . History of colonic polyps   . Colon cancer screening 05/16/2020  . Healthcare maintenance 01/12/2020  . Tinnitus 05/11/2019  . Skin lesion 07/19/2018  . Trigger finger 07/19/2018  . Family history of heart disease 07/19/2018  . Syncope 03/17/2018  . Advance care planning 08/07/2014  .  Radicular pain 08/21/2012  . Medicare welcome exam 02/13/2012  . ANEMIA, VITAMIN B12 DEFICIENCY 07/15/2009  . Vitamin D deficiency 04/14/2009  . BACK PAIN 08/16/2007  . TESTOSTERONE DEFICIENCY 03/04/2007  . Hyperlipidemia 03/04/2007  . ERECTILE DYSFUNCTION 03/04/2007  . HYPERTENSION, BENIGN ESSENTIAL 03/04/2007  . GERD 03/04/2007  . Diabetes mellitus without complication ([place]) 99/37/1696  . BPH (benign prostatic hyperplasia) 03/01/2007  . TOBACCO ABUSE, HX OF 03/01/2007    Past Surgical History:  Procedure Laterality Date  . COLONOSCOPY    . COLONOSCOPY WITH PROPOFOL N/A 06/07/2020   Procedure: COLONOSCOPY WITH PROPOFOL;  Surgeon: Lin Landsman, MD;  Location[facility] ENDOSCOPY;  Service: Gastroenterology;  Laterality: N/A;  . dental implant    . LAMINECTOMY  1993   L5/S1  . SEPTOPLASTY  1970    Prior to Admission medications   Medication Sig Start Date End Date Taking? Authorizing Provider  Ascorbic Acid (VITAMIN C) 1000 MG tablet Take 1,000 mg by mouth daily.    [provider]  aspirin EC 81 MG tablet Take 81 mg by mouth daily.    [provider]  Blood Glucose Monitoring Suppl (ONE TOUCH ULTRA 2) w/Device KIT Use as instructed to check blood sugar daily as needed.  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 11/28/18   Tonia Ghent, MD  Cholecalciferol (VITAMIN D-3) 25 MCG (1000 UT) CAPS Take 2 capsules (2,000 Units total)  by mouth daily. 01/08/20   Tonia Ghent, MD  docusate sodium (COLACE) 100 MG capsule Take 100 mg by mouth 2 (two) times daily.    [provider]  esomeprazole (NEXIUM) 40 MG capsule TAKE 1 CAPSULE BY MOUTH DAILY AT 12 NOON 08/07/19   Tonia Ghent, MD  ezetimibe (ZETIA) 10 MG tablet TAKE 1 TABLET BY MOUTH ONCE DAILY 11/03/19   Tonia Ghent, MD  fluticasone [facility]) 50 MCG/ACT nasal spray PLACE 1 SPRAY INTO BOTH NOSTRILS TWICE DAILY 12/01/19   Tonia Ghent, MD  folic acid (FOLVITE) 662 MCG tablet Take 400 mcg by  mouth daily.    [provider]  glucose blood (ONE TOUCH ULTRA TEST) test strip Use as instructed to check blood sugar daily as needed.  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 11/04/19   Tonia Ghent, MD  Lancets [facility] ULTRASOFT) lancets Use as instructed to check blood sugar daily as needed.  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 11/28/18   Tonia Ghent, MD  levocetirizine [facility]) 5 MG tablet Take 1 tablet (5 mg total) by mouth every evening. 01/08/20   Tonia Ghent, MD  lisinopril-hydrochlorothiazide (ZESTORETIC) 10-12.5 MG tablet Take 0.5 tablets by mouth daily. 01/08/20   Tonia Ghent, MD  metFORMIN (GLUCOPHAGE) 500 MG tablet Take 2 tablets in the morning and in the evening. 09/20/20   Tonia Ghent, MD  Omega-3 Fatty Acids (FISH OIL PO) Take 2,400 mg by mouth daily.    [provider]  potassium chloride (KLOR-CON) 10 MEQ tablet TAKE 1 TABLET BY MOUTH 2 TIMES DAILY 08/09/20   Tonia Ghent, MD  pravastatin (PRAVACHOL) 20 MG tablet Take 2 tablets (40 mg total) by mouth daily. 09/20/20   Tonia Ghent, MD  tadalafil (CIALIS) 5 MG tablet Take 1 tablet (5 mg total) by mouth daily. For BPH 09/20/20   Tonia Ghent, MD  vitamin B-12 (CYANOCOBALAMIN) 1000 MCG tablet Take 1 tablet (1,000 mcg total) by mouth daily. 05/10/20   Tonia Ghent, MD    Allergies Atorvastatin  Family History  Problem Relation Age of Onset  . Hypertension Mother   . Diabetes Mother   . Hyperlipidemia Mother   . Hypothyroidism Mother   . Dementia Mother   . Alcohol abuse Father   . Heart disease Father   . Heart attack Father   . Diabetes Sister   . Colon cancer Neg Hx   . Prostate cancer Neg Hx   . Esophageal cancer Neg Hx   . Stomach cancer Neg Hx   . Rectal cancer Neg Hx     Social History Social History   Tobacco Use  . Smoking status: Former Smoker    Years: 40.00    Quit date: 09/17/2009    Years since quitting: 11.1  . Smokeless tobacco: Never Used   . Tobacco comment: vapor  Vaping Use  . Vaping Use: Every day  . Devices: vaping only   Substance Use Topics  . 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 Drug use: No    Review of Systems  Constitutional: No fever/chills Eyes: No visual changes. ENT: No sore throat. Cardiovascular: Denies chest pain.  Positive for syncope. Respiratory: Denies shortness of breath. Gastrointestinal: Positive for abdominal pain, nausea, and vomiting.  No diarrhea.  No constipation. Genitourinary: Negative for dysuria. Musculoskeletal: Negative for back pain. Skin: Negative for rash. Neurological: Negative for headaches, focal weakness or  numbness.  ____________________________________________   PHYSICAL EXAM:  VITAL SIGNS: ED Triage Vitals  Enc Vitals Group     BP 10/26/20 1110 (!) 175/99     Pulse Rate 10/26/20 1110 95     Resp 10/26/20 1110 18     Temp 10/26/20 1110 97.9 F (36.6 C)     Temp Source 10/26/20 1110 Oral     SpO2 10/26/20 1110 97 %     Weight 10/26/20 1110 188 lb 4.4 oz (85.4 kg)     Height --      Head Circumference --      Peak Flow --      Pain Score 10/26/20 1229 0     Pain Loc --      Pain Edu? --      Excl. in [place]? --     Constitutional: Alert and oriented. Eyes: Conjunctivae are normal.  Pupils equal round and reactive to light bilaterally. Head: Ecchymosis over right eyebrow and eyelid.  No facial bony tenderness or step-offs. Nose: No congestion/rhinnorhea. Mouth/Throat: Mucous membranes are moist. Neck: Normal ROM, no midline cervical spine tenderness. Cardiovascular: Normal rate, regular rhythm. Grossly normal heart sounds.  2+ radial pulses bilaterally. Respiratory: Normal respiratory effort.  No retractions. Lungs CTAB. Gastrointestinal: Soft and nontender. No distention. Genitourinary: deferred Musculoskeletal: No lower extremity tenderness nor edema.  Tenderness to palpation over midline lumbar spine. Neurologic:  Normal speech and language.  No gross focal neurologic deficits are appreciated. Skin:  Skin is warm, dry and intact. No rash noted. Psychiatric: Mood and affect are normal. Speech and behavior are normal.  ____________________________________________   LABS (all labs ordered are listed, but only abnormal results are displayed)  Labs Reviewed  BASIC METABOLIC PANEL - Abnormal; Notable for the following components:      Result Value   Glucose, Bld 168 (*)    All other components within normal limits  CBC - Abnormal; Notable for the following components:   WBC 12.4 (*)    Hemoglobin 12.4 (*)    HCT 36.5 (*)    All other components within normal limits  URINALYSIS, COMPLETE (UACMP) WITH MICROSCOPIC - Abnormal; Notable for the following components:   Color, Urine YELLOW (*)    APPearance CLEAR (*)    All other components within normal limits  CBG MONITORING, ED  TROPONIN I (HIGH SENSITIVITY)   ____________________________________________  EKG  ED ECG REPORT I, Blake Divine, the attending physician, personally viewed and interpreted this ECG.   Date: 10/26/2020  EKG Time: 11:11  Rate: 93  Rhythm: normal sinus rhythm  Axis: Normal  Intervals:none  ST&T Change: None   PROCEDURES  Procedure(s) performed (including Critical Care):  Procedures   ____________________________________________   INITIAL IMPRESSION / ASSESSMENT AND PLAN / ED COURSE       70 year old male with past medical history of hypertension, hyperlipidemia, diabetes, and GERD who presents to the ED following episode of nausea and vomiting with abdominal cramping, after which he became very lightheaded and passed out.  Symptoms sound consistent with vasovagal episode and patient had one similar episode in the past with unremarkable syncopal episode afterwards.  EKG today shows no evidence of arrhythmia or ischemia, labs thus far unremarkable.  We will add on troponin and observe on cardiac monitor.  We will also check CT head and  C-spine given his fall.  He also complains of some low back pain, which we will further assess with x-ray.  Lumbar spine x-ray reviewed by me and shows  no acute fracture, negative for acute process per radiology.  CT head and cervical spine are negative for acute process.  Patient does appear to have a nasal fracture, which she states is old.  No arrhythmias noted on cardiac monitor and he has remained asymptomatic here in the ED with negative troponin.  He is appropriate for discharge home with PCP and cardiology follow-up, was counseled to return to the ED for new worsening symptoms.  Patient agrees with plan.      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   Blake Divine, MD 10/26/20 1450

## 2020-10-26 NOTE — ED Notes (Signed)
Patient transported to CT 

## 2020-10-26 NOTE — Telephone Encounter (Signed)
[place] [facility] - Client TELEPHONE ADVICE RECORD [facility] Patient Name: Thomas Todd Gender: Male DOB: [birth year] Age: 70 Y 67 M 26 D Return Phone Number: [phone] (Primary), [phone] (Secondary) Address: City/State/Zip: [place] [address] Client [place] [facility] - Client Client Site [place] Physician Renford Dills - MD Contact Type Call Who Is Calling Patient / Member / Family / Caregiver Call Type Triage / Clinical Caller Name Rainey Kahrs Relationship To Patient Spouse Return Phone Number [phone] (Primary) Chief Complaint FAINTING or [place] Reason for Call Symptomatic / Request for [place] states he is having stomach pains, vomiting, passing out with hitting his head, swollen black eye, back pain and headache. Translation No Nurse Assessment Nurse: Wynetta Emery, RN, Baker Janus Date/Time Eilene Ghazi Time): 10/26/2020 9:46:29 AM Confirm and document reason for call. If symptomatic, describe symptoms. ---Jenny Reichmann started getting sick at 3am this morning; stomach pains, vomiting x 2 episodes, he passed out with hitting his head, (fell straight back and hit head) also notes he has swollen black eye, back pain and headache. 97.6 Does the patient have any new or worsening symptoms? ---Yes Will a triage be completed? ---Yes Related visit to physician within the last 2 weeks? ---Yes Does the PT have any chronic conditions? (i.e. diabetes, asthma, this includes High risk factors for pregnancy, etc.) ---Yes List chronic conditions. ---HTN Hyperlipidemia Diabetes Is this a behavioral health or substance abuse call? ---No Guidelines Guideline Title Affirmed Question Affirmed Notes Nurse Date/Time (Eastern Time) Head Injury [1] Knocked out (unconscious) < 1 minute AND [2] now fine Wynetta Emery, Pension scheme manager 10/26/2020 9:50:00 AM Disp. Time Eilene Ghazi Time) Disposition Final  User 10/26/2020 9:44:19 AM Send to Urgent Queue Peggye Fothergill 10/26/2020 9:53:16 AM Go to ED Now (or PCP triage) Yes Wynetta Emery, RN, Juleen [place] NOTE: All timestamps contained within this report are represented as [place] Standard Time. CONFIDENTIALTY NOTICE: This fax transmission is intended only for the addressee. It contains information that is legally privileged, confidential or otherwise protected from use or disclosure. If you are not the intended recipient, you are strictly prohibited from reviewing, disclosing, copying using or disseminating any of this information or taking any action in reliance on or regarding this information. If you have received this fax in error, please notify us immediately by telephone so that we can arrange for its return to [place]. Phone: [phone], Toll-Free: [phone], Fax: [phone] Page: 2 of 2 Call Id: [id number] [place] Disagree/Comply Comply Caller Understands Yes PreDisposition Call Doctor Care Advice Given Per Guideline GO TO ED NOW (OR PCP TRIAGE): * It is better and safer if another adult drives instead of you. * Put a cold pack or an ice bag (wrapped in a moist towel) on the area for 20 minutes. * Repeat in 1 hour, then as needed. * This will help decrease pain, swelling, and bruising. * Caution: avoid frostbite. CARE ADVICE given per Head Injury (Adult) guideline. * Then apply pressure with a sterile gauze for 10 minutes to stop any bleeding. * If there is a scrape or cut, wash it off with soap and water. Comments User: Michele Rockers, RN Date/Time Eilene Ghazi Time): 10/26/2020 9:58:20 AM RN NOTE; Patient does not want to go to ED wants to be seen by his Md Dr Damita Dunnings. needs seen due to falling hitting head after passing out swollen face with black eye vomited x 2 -- needs seen today. Referrals REFERRED TO PCP OFFICE Warm transfer to  backline

## 2020-10-26 NOTE — Telephone Encounter (Signed)
Patient's wife called the office and was transferred to Access Nurse because he had passed out. Baker Janus from Access Nurse called stating that she had advised patient to go to the ER and he declined. Spoke to patient's wife (on [place]) and was advised that her husband passed out sometime this morning between 2:00-3:00 am. Patient's wife stated that he was having severe stomach pain, passed out and hit his head. Patient's wife stated that he has a black eye, face is swollen and a slight headache. Advised patient's wife that he needs to go to the ER to be evaluated and may need a scan of his head. Patient's wife stated that she is not sure that she can get her husband to go. Advised patient's wife that I will be glad to talk to the patient and advise him directly. Patient got on the phone and stated that he passed out and  thinks it was from the severe stomach pain. Patient stated that he started vomiting after he hit his head. Patient stated after passing out his head was slopped over and he could hardly raise his head up. Patient was advised that for his safety and well being he needs to go to the ER and be evaluated. Patient agreed to go and thanked me for the advice. Patient requested that I call the ER at [facility] and let them know he was on his way. Called and spoke to [facility] triage nurse and gave her information on the patient.

## 2020-10-26 NOTE — Telephone Encounter (Signed)
Please see note below access nurse note; R Laws LPN has already talked with pt and pt agreed to go to ED.Sending note to Dr Damita Dunnings.

## 2020-10-26 NOTE — ED Notes (Signed)
MD at bedside. 

## 2020-10-27 ENCOUNTER — Other Ambulatory Visit: Payer: Self-pay | Admitting: Family Medicine

## 2020-10-27 ENCOUNTER — Telehealth: Payer: Self-pay

## 2020-10-27 DIAGNOSIS — E785 Hyperlipidemia, unspecified: Secondary | ICD-10-CM

## 2020-10-27 NOTE — Telephone Encounter (Signed)
Monday may still be reasonable.  It really depends on how he is feeling.  He likely had a vagal episode that caused him to pass out.  I was more concerned about getting his head scan done since he had bumped his head.  Since his work-up in the emergency room was negative he would not have to come in urgently.    We do need to consider options about him having this vagal episode.  I would avoid straining or heavy lifting in the meantime.  If he is lightheaded at all then we need to decrease his blood pressure medication.  It is reasonable to have the patient consider seeing cardiology, given the recent events.  We can talk about that when he comes in.  If he is clearly feeling worse in the meantime, then we need to get him seen sooner rather than later.  Thanks.

## 2020-10-27 NOTE — Telephone Encounter (Signed)
[name] - Client Nonclinical Telephone Record AccessNurse Client [facility] Night - Client Client Site [place] Physician Renford Dills - MD Contact Type Call Who Is Calling Patient / Member / Family / Caregiver Caller Name Thomas Todd Caller Phone Number [phone] Patient Name Thomas Todd Patient DOB [birth year] Call Type Message Only Information Provided Reason for Call Request to Schedule Office Appointment Initial Comment caller states her husband needs an appt, the RN sent him to the ER yesterday and they want him to see his PCP asap Additional Comment message sent, office hours provided, declined triage Disp. Time Disposition Final User 10/27/2020 7:56:45 AM General Information Provided Yes Birdena Jubilee Call Closed By: Birdena Jubilee Transaction Date/Time: 10/27/2020 7:54:10 AM (ET)

## 2020-10-27 NOTE — Telephone Encounter (Signed)
Spoke with wife about message below and she is okay for patient to wait for mondays appt. Advised to call back if patient feels worse in the meantime.

## 2020-10-27 NOTE — Telephone Encounter (Signed)
I was able to schedule patient for Monday 11/01/20 . Patients wife states that he has a really bad black eye with a bad knot on it. Patient in the background kept saying "he didn't know what the urgency was he had appt in April. " ER suggested he be seen asap. Didn't know if you wanted to get him in before Monday? Please advise. EM

## 2020-10-27 NOTE — Telephone Encounter (Signed)
Pharmacy requests refill on: Esomeprazole 40 mg   LAST REFILL: 08/07/2019 (Q-90, R-3) LAST OV: 09/20/2020 NEXT OV: 11/01/2020 PHARMACY: [facility] requests refill on: Ezetimibe 10 mg   LAST REFILL: 11/03/2019 (Q-90, R-3) LAST OV: 09/20/2020 NEXT OV: 11/01/2020 PHARMACY: [facility]

## 2020-11-01 ENCOUNTER — Other Ambulatory Visit: Payer: Self-pay

## 2020-11-01 ENCOUNTER — Encounter: Payer: Self-pay | Admitting: Family Medicine

## 2020-11-01 ENCOUNTER — Ambulatory Visit (INDEPENDENT_AMBULATORY_CARE_PROVIDER_SITE_OTHER): Payer: PPO | Admitting: Family Medicine

## 2020-11-01 VITALS — BP 144/78 | HR 77 | Temp 97.2°F | Ht 69.0 in | Wt 189.0 lb

## 2020-11-01 DIAGNOSIS — E785 Hyperlipidemia, unspecified: Secondary | ICD-10-CM | POA: Diagnosis not present

## 2020-11-01 DIAGNOSIS — Z23 Encounter for immunization: Secondary | ICD-10-CM | POA: Diagnosis not present

## 2020-11-01 DIAGNOSIS — R55 Syncope and collapse: Secondary | ICD-10-CM | POA: Diagnosis not present

## 2020-11-01 DIAGNOSIS — N400 Enlarged prostate without lower urinary tract symptoms: Secondary | ICD-10-CM

## 2020-11-01 MED ORDER — PRAVASTATIN SODIUM 20 MG PO TABS: ORAL_TABLET | ORAL | Status: DC

## 2020-11-01 NOTE — Progress Notes (Signed)
This visit occurred during the SARS-CoV-2 public health emergency.  Safety protocols were in place, including screening questions prior to the visit, additional usage of staff PPE, and extensive cleaning of exam room while observing appropriate contact time as indicated for disinfecting solutions.  Middle of the night, got up to go to BR.  Was in the bathroom, passed out.  Had prodromal sx.  Woke up on the floor.  Nausea persisted after the event.  Gagging after the event.  Was fatigued.  Didn't lose control of bladder.  Then vomited after getting up.  After that, he was fatigued but not hurting.  No CP.  He called in, we advised ER eval, he went to ER.  ER eval/course d/w pt.    H/o vasovagal sx in the past.  Had seen Dr. Rockey Situ in the past.    Flu shot today.    He is off pravastatin, see prev mychart notes.  He is holding it do to scalp changes.  He is on cialis and that helped his stream.    Sugar has been ~110-130.  107 this AM.  No lows.    He doesn't have typical orthostatic sx.  He didn't have SZ activity or B/B incontinence.   Meds, vitals, and allergies reviewed.   ROS: Per HPI unless specifically indicated in ROS section   GEN: nad, alert and oriented HEENT: ncat except for resolving bruising around the right orbit. NECK: supple w/o LA CV: rrr PULM: ctab, no inc wob ABD: soft, +bs EXT: no edema SKIN: Well-perfused.  35 minutes were devoted to patient care in this encounter (this includes time spent reviewing the patient's file/history, interviewing and examining the patient, counseling/reviewing plan with patient).

## 2020-11-01 NOTE — Patient Instructions (Signed)
Don't change your meds for now but make sure to drink enough water to keep your urine clear.  I'll check with cardiology in the meantime.  Take care.  Glad to see you.

## 2020-11-02 ENCOUNTER — Other Ambulatory Visit: Payer: Self-pay | Admitting: Family Medicine

## 2020-11-02 DIAGNOSIS — J302 Other seasonal allergic rhinitis: Secondary | ICD-10-CM

## 2020-11-03 NOTE — Assessment & Plan Note (Signed)
He is holding his statin due to previous Changes.  He will update me after few more weeks.

## 2020-11-03 NOTE — Assessment & Plan Note (Signed)
Cialis did help his stream and make sense to continue as is for now.  I do not think this was the main causal agent for the event described above.

## 2020-11-03 NOTE — Assessment & Plan Note (Addendum)
He did not have typical seizure symptoms.  He is not having orthostatic symptoms.  Emergency room work-up discussed with patient.  I will update cardiology but I do not think it makes sense to change his medications at this point.  I want him to stay well-hydrated and we need to be careful about making sure he does not get hypotensive from his medications; his blood pressure is not low today here in clinic and is not orthostatic.  We talked about vasovagal pathophysiology and this appears to fit his recent event.  I need update from cardiology because am not sure what else we can do at this point to prevent these episodes or decrease his risk going forward.

## 2020-11-17 ENCOUNTER — Telehealth: Payer: Self-pay

## 2020-11-17 NOTE — Chronic Care Management (AMB) (Addendum)
Chronic Care Management Pharmacy Assistant   Name: Thomas Todd  MRN: [id number] DOB: 07/22/1951  Reason for Encounter: Disease State- Diabetes  Patient Question:  1.  Have you seen any other providers or had any medication changes since your last visit with [name], Pharm. D? Yes  11/01/20- Dr. Damita Dunnings- PCP- held pravastatin due to scalp changes 10/26/20- ED visit- Loss of consciousness and head injury 09/20/20- Dr. Damita Dunnings- Video Visit- Started Cialis as needed, increased metformin from 3 daily to 4 tablets daily.  09/19/20- Norton Pastel- NP- urgent care  PCP : Tonia Ghent, MD  Allergies:   Allergies  Allergen Reactions   Atorvastatin     Myalgias    Medications: Outpatient Encounter Medications as of 11/17/2020  Medication Sig   Ascorbic Acid (VITAMIN C) 1000 MG tablet Take 1,000 mg by mouth daily.   aspirin EC 81 MG tablet Take 81 mg by mouth daily.   Blood Glucose Monitoring Suppl (ONE TOUCH ULTRA 2) w/Device KIT Use as instructed to check blood sugar daily as needed.  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   Cholecalciferol (VITAMIN D-3) 25 MCG (1000 UT) CAPS Take 2 capsules (2,000 Units total) by mouth daily.   docusate sodium (COLACE) 100 MG capsule Take 100 mg by mouth 2 (two) times daily.   esomeprazole (NEXIUM) 40 MG capsule TAKE 1 CAPSULE BY MOUTH DAILY AT 12 NOON   ezetimibe (ZETIA) 10 MG tablet TAKE 1 TABLET BY MOUTH ONCE DAILY   fluticasone (FLONASE) 50 MCG/ACT nasal spray PLACE 1 SPRAY INTO BOTH NOSTRILS TWICE DAILY   folic acid (FOLVITE) 086 MCG tablet Take 400 mcg by mouth daily.   glucose blood (ONE TOUCH ULTRA TEST) test strip Use as instructed to check blood sugar daily as needed.  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   Lancets (ONETOUCH ULTRASOFT) lancets Use as instructed to check blood sugar daily as needed.  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   levocetirizine (XYZAL) 5 MG tablet Take 1 tablet (5 mg total) by mouth every  evening.   lisinopril-hydrochlorothiazide (ZESTORETIC) 10-12.5 MG tablet Take 0.5 tablets by mouth daily.   metFORMIN (GLUCOPHAGE) 500 MG tablet Take 2 tablets in the morning and in the evening.   Omega-3 Fatty Acids (FISH OIL PO) Take 2,400 mg by mouth daily.   potassium chloride (KLOR-CON) 10 MEQ tablet TAKE 1 TABLET BY MOUTH 2 TIMES DAILY   pravastatin (PRAVACHOL) 20 MG tablet TAKE 1 TABLET BY MOUTH DAILY   tadalafil (CIALIS) 5 MG tablet Take 1 tablet (5 mg total) by mouth daily. For BPH   vitamin B-12 (CYANOCOBALAMIN) 1000 MCG tablet Take 1 tablet (1,000 mcg total) by mouth daily.   No facility-administered encounter medications on file as of 11/17/2020.    Current Diagnosis: Patient Active Problem List   Diagnosis Date Noted   History of colonic polyps    Colon cancer screening 05/16/2020   Healthcare maintenance 01/12/2020   Tinnitus 05/11/2019   Skin lesion 07/19/2018   Trigger finger 07/19/2018   Family history of heart disease 07/19/2018   Syncope 03/17/2018   Advance care planning 08/07/2014   Radicular pain 08/21/2012   Medicare welcome exam 02/13/2012   ANEMIA, VITAMIN B12 DEFICIENCY 07/15/2009   Vitamin D deficiency 04/14/2009   BACK PAIN 08/16/2007   TESTOSTERONE DEFICIENCY 03/04/2007   Hyperlipidemia 03/04/2007   ERECTILE DYSFUNCTION 03/04/2007   HYPERTENSION, BENIGN ESSENTIAL 03/04/2007   GERD 03/04/2007   Diabetes mellitus without complication ([facility]) 76/19/5093  BPH (benign prostatic hyperplasia) 03/01/2007   TOBACCO ABUSE, HX OF 03/01/2007    Recent Relevant Labs: Lab Results  Component Value Date/Time   HGBA1C 7.5 (H) 09/09/2020 08:28 AM   HGBA1C 8.1 (H) 05/03/2020 07:53 AM   MICROALBUR 2.5 (H) 09/09/2010 08:27 AM   MICROALBUR 0.2 04/09/2009 08:39 AM    Kidney Function Lab Results  Component Value Date/Time   CREATININE 0.76 10/26/2020 11:13 AM   CREATININE 0.85 09/09/2020 08:28 AM   GFR 90.06 09/09/2020 08:28 AM   GFRNONAA >60 10/26/2020 11:13 AM    GFRAA 113 04/03/2008 09:55 AM    Current antihyperglycemic regimen:  Metformin 500 mg - 2 tablets AM, 2 tablets in the evening  Patient confirmed he is taking the above medication at the listed dose.  What recent interventions/DTPs have been made to improve glycemic control:  Increased metformin to 4 tablets daily  Have there been any recent hospitalizations or ED visits since last visit with CPP? Yes  10/26/20- ED visit- Loss of consciousness and head injury  Patient denies hypoglycemic symptoms, including Pale, Sweaty, Shaky, Hungry, Nervous/irritable and Vision changes  Patient denies hyperglycemic symptoms, including blurry vision, excessive thirst, fatigue, polyuria and weakness  How often are you checking your blood sugar? once daily  What are your blood sugars ranging?  Fasting:  11/18/20- 101 11/17/20- 107 11/16/20- 100 11/15/20- 105 Before meals: NA After meals: NA Bedtime: NA  On insulin? No  During the week, how often does your blood glucose drop below 70? Never  Are you checking your feet daily/regularly? Yes- denies any wounds or sores  Adherence Review: Is the patient currently on a STATIN medication? No- currently holding pravastatin per Dr. Damita Dunnings Is the patient currently on ACE/ARB medication? Yes Does the patient have >5 day gap between last estimated fill dates? CPP to review  Patient states he is doing very well. States his blood sugar has come down and he is very happy with where it is now. Notes a blood pressure of 127/73 on 11/17/20.   Reminded patient of telephone appointment with Sharyn Lull 12/14/20 at 8:30. Asked him to have any blood pressure and blood sugar readings available along with his medications.   Follow-Up:  Pharmacist Review  [name], CPP notified  Margaretmary Dys, [place] Assistant [phone]  I have reviewed the care management and care coordination activities outlined in this encounter and I am certifying that  I agree with the content of this note. No further action required.  [name], PharmD Clinical Pharmacist [facility] at [facility] [phone]   Total time spent for month: 28

## 2020-11-23 ENCOUNTER — Telehealth: Payer: Self-pay | Admitting: Family Medicine

## 2020-11-23 NOTE — Telephone Encounter (Signed)
LVM for pt to rtn my call to r/s appt with nha on 01/03/21.

## 2020-12-06 ENCOUNTER — Other Ambulatory Visit: Payer: Self-pay

## 2020-12-06 MED ORDER — GLUCOSE BLOOD VI STRP: ORAL_STRIP | 3 refills | Status: DC

## 2020-12-14 ENCOUNTER — Telehealth: Payer: Self-pay

## 2020-12-14 ENCOUNTER — Ambulatory Visit (INDEPENDENT_AMBULATORY_CARE_PROVIDER_SITE_OTHER): Payer: PPO

## 2020-12-14 ENCOUNTER — Other Ambulatory Visit: Payer: Self-pay

## 2020-12-14 DIAGNOSIS — I10 Essential (primary) hypertension: Secondary | ICD-10-CM

## 2020-12-14 DIAGNOSIS — R55 Syncope and collapse: Secondary | ICD-10-CM

## 2020-12-14 DIAGNOSIS — E119 Type 2 diabetes mellitus without complications: Secondary | ICD-10-CM

## 2020-12-14 DIAGNOSIS — E785 Hyperlipidemia, unspecified: Secondary | ICD-10-CM

## 2020-12-14 NOTE — Telephone Encounter (Signed)
Pt reports he is waiting to hear back from PCP regarding cardiology consult. I believe this was discussed on 11/01/20 ED follow up for vasovagal symptoms.   [name], PharmD Clinical Pharmacist [facility] at [facility] [phone]

## 2020-12-14 NOTE — Patient Instructions (Signed)
Dear Thomas Todd,  Below is a summary of the goals we discussed during our follow up appointment on December 14, 2020. Please contact me anytime with questions or concerns.   Visit Information  Patient Care Plan: CCM Pharmacy Care Plan    Problem Identified: [facility] AMB "PATIENT-SPECIFIC PROBLEM"     Long-Range Goal: Disease Management   Start Date: 12/14/2020  Priority: High  Note:    Current Barriers:  . Home BP elevated today . Potential adverse drug reaction to pravastatin resulting in dose reduction . None other identified  Pharmacist Clinical Goal(s):  Marland Kitchen Patient will contact provider office for questions/concerns as evidenced notation of same in electronic health record through collaboration with PharmD and provider.   Interventions: . 1:1 collaboration with Tonia Ghent, MD regarding development and update of comprehensive plan of care as evidenced by provider attestation and co-signature . Inter-disciplinary care team collaboration (see longitudinal plan of care) . Comprehensive medication review performed; medication list updated in electronic medical record  Hypertension (BP goal <140/90) Query-Controlled - isolated elevated home BP today, most home readings within goal -Current treatment: 1. Lisinopril/HCTZ 10/12.5 mg - 1/2 tablet daily  2. Potassium chloride 10 mEq - 1 tablet BID  -Medications previously tried: none  -Checks home BP weekly -Current home readings: 148/82 (during visit today),127/73, 132/77, 129/72, 136/77 -Denies hypotensive/hypertensive symptoms -Educated on Importance of home blood pressure monitoring; -Counseled to monitor BP at home weekly, document, and provide log at future appointments -Recommended to continue current medication; Call if home BP consistently above 150/90.   Hyperlipidemia: (LDL goal < 100) -Not ideally controlled - Pravastatin increased to 40 mg at last visit (12/21) then placed on hold Feb 2022 due to scalp changes. Pt  is now tolerating twice weekly dosing without issues. Zetia remained unchanged. LDL above goal. -Current treatment: . Pravastatin 20 mg - 1/2 tablet every Monday and Friday  . Zetia 10 mg - 1 tablet daily  -Medications previously tried: atorvastatin - myalgia  -Reports he has been on 1/2 table pravastatin two day/week about 3 weeks. Feeling good on this so far. He is working on increasing the dose slowly with PCP. He reports he did not notice much change in scalp irritation/hair loss when holding pravastatin. -Current dietary patterns: denies changes - tries to limit portions, see initial visit -Current exercise habits: yard maintenance, none formal  -Educated on Cholesterol goals;  -Recommended to continue current medication; Increase slowly - try increasing pravastatin to 1 tablet twice weekly.   Diabetes (A1c goal <7%) Query-Controlled - tolerating metformin dose increase well, fasting BG controlled, pending updated A1c next month -Current medications: 3. Metformin 500 mg - 2 tablets AM, 2 tablets in the evening (increased 09/17/20) -Medications previously tried: none  -Current home glucose readings - checking daily  . fasting glucose:  o 12/13/20 - 115 o 12/12/20 - 103 o 12/11/20 - 103 o 12/10/20 -116    11/18/20- 101  11/17/20- 107  11/16/20- 100  11/15/20- 105 . post prandial glucose: none  -Denies hypoglycemic/hyperglycemic symptoms -Recommended to continue current medication  Pt also reports doing well off Flomax and on Cialis 5 mg daily for BPH.  Patient Goals/Self-Care Activities . Patient will:  - take medications as prescribed check glucose daily, document, and provide at future appointments check blood pressure weekly, document, and provide at future appointments  Follow Up Plan: The care management team will reach out to the patient again over the next 90 days.  CMA call for BP  and BG log review in April.   Medication Assistance: None required.  Patient affirms current  coverage meets needs.       The patient verbalized understanding of instructions, educational materials, and care plan provided today and agreed to receive a mailed copy of patient instructions, educational materials, and care plan.   [name], PharmD Clinical Pharmacist [facility] at [facility] [phone]  How to Take Your Blood Pressure Blood pressure measures how strongly your blood is pressing against the walls of your arteries. Arteries are blood vessels that carry blood from your heart throughout your body. You can take your blood pressure at home with a machine. You may need to check your blood pressure at home:  To check if you have high blood pressure (hypertension).  To check your blood pressure over time.  To make sure your blood pressure medicine is working. Supplies needed:  Blood pressure machine, or monitor.  Dining room chair to sit in.  Table or desk.  Small notebook.  Pencil or pen. How to prepare Avoid these things for 30 minutes before checking your blood pressure:  Having drinks with caffeine in them, such as coffee or tea.  Drinking alcohol.  Eating.  Smoking.  Exercising. Do these things five minutes before checking your blood pressure:  Go to the bathroom and pee (urinate).  Sit in a dining chair. Do not sit in a soft couch or an armchair.  Be quiet. Do not talk. How to take your blood pressure Follow the instructions that came with your machine. If you have a digital blood pressure monitor, these may be the instructions: 1. Sit up straight. 2. Place your feet on the floor. Do not cross your ankles or legs. 3. Rest your left arm at the level of your heart. You may rest it on a table, desk, or chair. 4. Pull up your shirt sleeve. 5. Wrap the blood pressure cuff around the upper part of your left arm. The cuff should be 1 inch (2.5 cm) above your elbow. It is best to wrap the cuff around bare skin. 6. Fit the cuff  snugly around your arm. You should be able to place only one finger between the cuff and your arm. 7. Place the cord so that it rests in the bend of your elbow. 8. Press the power button. 9. Sit quietly while the cuff fills with air and loses air. 10. Write down the numbers on the screen. 11. Wait 2-3 minutes and then repeat steps 1-10.   What do the numbers mean? Two numbers make up your blood pressure. The first number is called systolic pressure. The second is called diastolic pressure. An example of a blood pressure reading is "120 over 80" (or 120/80). If you are an adult and do not have a medical condition, use this guide to find out if your blood pressure is normal: Normal  First number: below 120.  Second number: below 80. Elevated  First number: 120-129.  Second number: below 80. Hypertension stage 1  First number: 130-139.  Second number: 80-89. Hypertension stage 2  First number: 140 or above.  Second number: 65 or above. Your blood pressure is above normal even if only the top or bottom number is above normal. Follow these instructions at home:  Check your blood pressure as often as your doctor tells you to.  Check your blood pressure at the same time every day.  Take your monitor to your next doctor's appointment. Your doctor  will: ? Make sure you are using it correctly. ? Make sure it is working right.  Make sure you understand what your blood pressure numbers should be.  Tell your doctor if your medicine is causing side effects.  Keep all follow-up visits as told by your doctor. This is important. General tips:  You will need a blood pressure machine, or monitor. Your doctor can suggest a monitor. You can buy one at a drugstore or online. When choosing one: ? Choose one with an arm cuff. ? Choose one that wraps around your upper arm. Only one finger should fit between your arm and the cuff. ? Do not choose one that measures your blood pressure from  your wrist or finger. Where to find more information American Heart Association: www.heart.org Contact a doctor if:  Your blood pressure keeps being high. Get help right away if:  Your first blood pressure number is higher than 180.  Your second blood pressure number is higher than 120. Summary  Check your blood pressure at the same time every day.  Avoid caffeine, alcohol, smoking, and exercise for 30 minutes before checking your blood pressure.  Make sure you understand what your blood pressure numbers should be. This information is not intended to replace advice given to you by your health care provider. Make sure you discuss any questions you have with your health care provider. Document Revised: 08/29/2019 Document Reviewed: 08/29/2019 Elsevier Patient Education  2021 [employer].

## 2020-12-14 NOTE — Progress Notes (Addendum)
Chronic Care Management Pharmacy Note  12/14/2020 Name:  Thomas Todd MRN:  [id number] DOB:  19-May-1951  Subjective: Thomas Todd is an 70 y.o. year old male who is a primary patient of Damita Dunnings, Elveria Rising, MD.  The CCM team was consulted for assistance with disease management and care coordination needs.    Engaged with patient by telephone for follow up visit in response to provider referral for pharmacy case management and/or care coordination services. Pt reports doing well with no further symptoms of syncope since last PCP visit on 11/01/20.   Consent to Services:  The patient was given information about Chronic Care Management services, agreed to services, and gave verbal consent prior to initiation of services.  Please see initial visit note for detailed documentation.   Patient Care Team: Tonia Ghent, MD as PCP - General (Family Medicine) [name], [facility] as Pharmacist (Pharmacist)  Recent office visits: 11/01/20- Dr. Damita Dunnings- PCP- ED follow up, pt holding pravastatin due to scalp changes, PCP will check with cardiology 09/20/20- Dr. Damita Dunnings- Video Visit- Started Cialis 5 mg daily, increased metformin from 3 daily to 4 tablets daily, increase pravastatin to 40 mg daily  Recent consult visits: None since last [facility] visits: 10/26/20 - ED visit- Rpisode of nausea and vomiting with abdominal cramping, after which he became very lightheaded and passed out.  Symptoms sound consistent with vasovagal episode and patient had one similar episode in the past with unremarkable syncopal episode afterwards. Discharge home with PCP and cardiology follow up.   Objective:  Lab Results  Component Value Date   CREATININE 0.76 10/26/2020   BUN 9 10/26/2020   GFR 90.06 09/09/2020   GFRNONAA >60 10/26/2020   GFRAA 113 04/03/2008   NA 139 10/26/2020   K 3.9 10/26/2020   CALCIUM 9.9 10/26/2020   CO2 25 10/26/2020   GLUCOSE 168 (H) 10/26/2020    Lab Results  Component  Value Date/Time   HGBA1C 7.5 (H) 09/09/2020 08:28 AM   HGBA1C 8.1 (H) 05/03/2020 07:53 AM   GFR 90.06 09/09/2020 08:28 AM   GFR 99.41 01/01/2020 07:34 AM   MICROALBUR 2.5 (H) 09/09/2010 08:27 AM   MICROALBUR 0.2 04/09/2009 08:39 AM    Last diabetic Eye exam:  Lab Results  Component Value Date/Time   HMDIABEYEEXA No Retinopathy 04/14/2019 12:00 AM    Last diabetic Foot exam:  01/08/20 PCP normal  Lab Results  Component Value Date   CHOL 184 09/09/2020   HDL 47.20 09/09/2020   LDLCALC 75 01/01/2020   LDLDIRECT 113.0 09/09/2020   TRIG 213.0 (H) 09/09/2020   CHOLHDL 4 09/09/2020    Hepatic Function Latest Ref Rng & Units 09/09/2020 01/01/2020 11/05/2018  Total Protein 6.0 - 8.3 g/dL 6.5 5.6(L) 6.3  Albumin 3.5 - 5.2 g/dL 4.5 4.2 4.5  AST 0 - 37 U/L 27 14 17   ALT 0 - 53 U/L 43 19 37  Alk Phosphatase 39 - 117 U/L 37(L) 37(L) 40  Total Bilirubin 0.2 - 1.2 mg/dL 0.8 0.4 0.9  Bilirubin, Direct 0.0 - 0.3 mg/dL - - 0.1    Lab Results  Component Value Date/Time   TSH 2.30 09/09/2020 08:28 AM   TSH 0.85 03/15/2018 09:01 AM    CBC Latest Ref Rng & Units 10/26/2020 09/09/2020 05/03/2020  WBC 4.0 - 10.5 K/uL 12.4(H) 8.1 8.2  Hemoglobin 13.0 - 17.0 g/dL 12.4(L) 13.8 12.8(L)  Hematocrit 39.0 - 52.0 % 36.5(L) 41.0 37.7(L)  Platelets 150 - 400 K/uL 205  198.0 183.0    Lab Results  Component Value Date/Time   VD25OH 36.93 09/09/2020 08:28 AM   VD25OH 28.71 (L) 05/03/2020 07:53 AM    Clinical ASCVD: Yes  The 10-year ASCVD risk score Mikey Bussing DC Jr., et al., 2013) is: 35.3%   Values used to calculate the score:     Age: 67 years     Sex: Male     Is Non-Hispanic African American: No     Diabetic: Yes     Tobacco smoker: No     Systolic Blood Pressure: 008 mmHg     Is BP treated: Yes     HDL Cholesterol: 47.2 mg/dL     Total Cholesterol: 184 mg/dL    Depression screen [facility] 2/9 09/20/2020 01/01/2020 11/13/2017  Decreased Interest 0 0 0  Down, Depressed, Hopeless 0 0 0  PHQ - 2 Score 0 0  0  Altered sleeping - 0 -  Tired, decreased energy - 0 -  Change in appetite - 0 -  Feeling bad or failure about yourself  - 0 -  Trouble concentrating - 0 -  Moving slowly or fidgety/restless - 0 -  Suicidal thoughts - 0 -  PHQ-9 Score - 0 -  Difficult doing work/chores - Not difficult at all -    Social History   Tobacco Use  Smoking Status Former Smoker  . Years: 40.00  . Quit date: 09/17/2009  . Years since quitting: 11.2  Smokeless Tobacco Never Used  Tobacco Comment   vapor   BP Readings from Last 3 Encounters:  11/01/20 (!) 144/78  10/26/20 137/82  09/20/20 139/85   Pulse Readings from Last 3 Encounters:  11/01/20 77  10/26/20 74  06/07/20 60   Wt Readings from Last 3 Encounters:  11/01/20 189 lb (85.7 kg)  10/26/20 188 lb 4.4 oz (85.4 kg)  09/20/20 188 lb 3.2 oz (85.4 kg)   BMI Readings from Last 3 Encounters:  11/01/20 27.91 kg/m  10/26/20 27.80 kg/m  09/20/20 27.79 kg/m    Assessment/Interventions: Review of patient past medical history, allergies, medications, health status, including review of consultants reports, laboratory and other test data, was performed as part of comprehensive evaluation and provision of chronic care management services.   SDOH:  (Social Determinants of Health) assessments and interventions performed: Yes  SDOH Screenings   Alcohol Screen: Low Risk   . Last Alcohol Screening Score (AUDIT): 0  Depression (PHQ2-9): Low Risk   . PHQ-2 Score: 0  Financial Resource Strain: Low Risk   . Difficulty of Paying Living Expenses: Not very hard  Food Insecurity: No Food Insecurity  . Worried About Charity fundraiser in the Last Year: Never true  . Ran Out of Food in the Last Year: Never true  Housing: Low Risk   . Last Housing Risk Score: 0  Physical Activity: Inactive  . Days of Exercise per Week: 0 days  . Minutes of Exercise per Session: 0 min  Social Connections: Not on file  Stress: No Stress Concern Present  . Feeling  of Stress : Not at all  Tobacco Use: Medium Risk  . Smoking Tobacco Use: Former Smoker  . Smokeless Tobacco Use: Never Used  Transportation Needs: No Transportation Needs  . Lack of Transportation (Medical): No  . Lack of Transportation (Non-Medical): No    CCM Care Plan  Allergies  Allergen Reactions  . Atorvastatin     Myalgias    Medications Reviewed Today    Reviewed by  [name], [facility] (Pharmacist) on 12/14/20 at [phone]  Med List Status: <None>  Medication Order Taking? Sig Documenting Provider Last Dose Status Informant  Ascorbic Acid (VITAMIN C) 1000 MG tablet [id number] Yes Take 1,000 mg by mouth daily. [provider] Taking Active   aspirin EC 81 MG tablet [id number] Yes Take 81 mg by mouth daily. [provider] Taking Active   Blood Glucose Monitoring Suppl (ONE TOUCH ULTRA 2) w/Device KIT [id number] Yes Use as instructed to check blood sugar daily as needed.  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 Tonia Ghent, MD Taking Active   Cholecalciferol (VITAMIN D-3) 25 MCG (1000 UT) CAPS [id number] Yes Take 2 capsules (2,000 Units total) by mouth daily. Tonia Ghent, MD Taking Active   docusate sodium (COLACE) 100 MG capsule [id number] Yes Take 100 mg by mouth 2 (two) times daily. [provider] Taking Active   esomeprazole (NEXIUM) 40 MG capsule [id number] Yes TAKE 1 CAPSULE BY MOUTH DAILY AT 12 NOON Tonia Ghent, MD Taking Active   ezetimibe (ZETIA) 10 MG tablet [id number] Yes TAKE 1 TABLET BY MOUTH ONCE DAILY Tonia Ghent, MD Taking Active   fluticasone [facility]) 50 MCG/ACT nasal spray [id number] Yes PLACE 1 SPRAY INTO BOTH NOSTRILS TWICE DAILY Tonia Ghent, MD Taking Active   folic acid (FOLVITE) 400 MCG tablet [id number] Yes Take 400 mcg by mouth daily. [provider] Taking Active   glucose blood (ONE TOUCH ULTRA TEST) test strip [id number] Yes Use as instructed to check blood sugar daily as needed.  Diagnosis:  E11.9  Non  insulin dependent. Tonia Ghent, MD Taking Active   Lancets [facility] ULTRASOFT) lancets [id number] Yes Use as instructed to check blood sugar daily as needed.  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 Tonia Ghent, MD Taking Active   levocetirizine [facility]) 5 MG tablet [id number] Yes Take 1 tablet (5 mg total) by mouth every evening. Tonia Ghent, MD Taking Active   lisinopril-hydrochlorothiazide (ZESTORETIC) 10-12.5 MG tablet [id number] Yes Take 0.5 tablets by mouth daily. Tonia Ghent, MD Taking Active   metFORMIN (GLUCOPHAGE) 500 MG tablet [id number] Yes Take 2 tablets in the morning and in the evening. Tonia Ghent, MD Taking Active   Omega-3 Fatty Acids (FISH OIL PO) [id number] Yes Take 2,400 mg by mouth daily. [provider] Taking Active   potassium chloride (KLOR-CON) 10 MEQ tablet [id number] Yes TAKE 1 TABLET BY MOUTH 2 TIMES DAILY Tonia Ghent, MD Taking Active   pravastatin (PRAVACHOL) 20 MG tablet [id number] Yes TAKE 1 TABLET BY MOUTH DAILY  Patient taking differently: TAKE 1 TABLET BY MOUTH DAILY (Current dose - 1 tablet on Monday and Friday only)   Tonia Ghent, MD Taking Active Self  tadalafil (CIALIS) 5 MG tablet [id number] Yes Take 1 tablet (5 mg total) by mouth daily. For BPH Tonia Ghent, MD Taking Active   vitamin B-12 (CYANOCOBALAMIN) 1000 MCG tablet [id number] Yes Take 1 tablet (1,000 mcg total) by mouth daily. Tonia Ghent, MD Taking Active           Patient Active Problem List   Diagnosis Date Noted  . History of colonic polyps   . Colon cancer screening 05/16/2020  . Healthcare maintenance 01/12/2020  . Tinnitus 05/11/2019  . Skin lesion 07/19/2018  . Trigger finger 07/19/2018  . Family history of heart disease 07/19/2018  . Syncope 03/17/2018  . Advance care planning 08/07/2014  . Radicular pain 08/21/2012  .  Medicare welcome exam 02/13/2012  . ANEMIA, VITAMIN B12 DEFICIENCY 07/15/2009  . Vitamin D deficiency 04/14/2009  .  BACK PAIN 08/16/2007  . TESTOSTERONE DEFICIENCY 03/04/2007  . Hyperlipidemia 03/04/2007  . ERECTILE DYSFUNCTION 03/04/2007  . HYPERTENSION, BENIGN ESSENTIAL 03/04/2007  . GERD 03/04/2007  . Diabetes mellitus without complication ([place]) 94/17/4081  . BPH (benign prostatic hyperplasia) 03/01/2007  . TOBACCO ABUSE, HX OF 03/01/2007    Immunization History  Administered Date(s) Administered  . Fluad Quad(high Dose 65+) 11/01/2020  . Influenza Split 07/26/2012  . Influenza Whole 11/11/2009  . Influenza,inj,Quad PF,6+ Mos 07/30/2013, 07/03/2014, 06/28/2015, 06/15/2016, 07/05/2017, 06/03/2018, 05/08/2019  . PFIZER(Purple Top)SARS-COV-2 Vaccination 05/24/2020, 06/14/2020  . Pneumococcal Conjugate-13 07/16/2018  . Pneumococcal Polysaccharide-23 01/26/2015  . Td 03/17/2002  . Tdap 02/13/2012  . Zoster 09/03/2014  . Zoster Recombinat (Shingrix) 01/23/2018, 03/25/2018    Conditions to be addressed/monitored:  Hypertension, Hyperlipidemia and Diabetes  Care Plan : [place]  Updates made by [name], [facility] since 12/14/2020 12:00 AM    Problem: CHL AMB "PATIENT-SPECIFIC PROBLEM"     Long-Range Goal: Disease Management   Start Date: 12/14/2020  Priority: High  Note:    Current Barriers:  . Home BP elevated today . Potential adverse drug reaction to pravastatin resulting in dose reduction . None other identified  Pharmacist Clinical Goal(s):  Marland Kitchen Patient will contact provider office for questions/concerns as evidenced notation of same in electronic health record through collaboration with PharmD and provider.   Interventions: . 1:1 collaboration with Tonia Ghent, MD regarding development and update of comprehensive plan of care as evidenced by provider attestation and co-signature . Inter-disciplinary care team collaboration (see longitudinal plan of care) . Comprehensive medication review performed; medication list updated in electronic medical  record  Hypertension (BP goal <140/90) Query-Controlled - isolated elevated home BP today, most home readings within goal -Current treatment: 1. Lisinopril/HCTZ 10/12.5 mg - 1/2 tablet daily  2. Potassium chloride 10 mEq - 1 tablet BID  -Medications previously tried: none  -Checks home BP weekly -Current home readings: 148/82 (during visit today),127/73, 132/77, 129/72, 136/77 -Denies hypotensive/hypertensive symptoms -Educated on Importance of home blood pressure monitoring; -Counseled to monitor BP at home weekly, document, and provide log at future appointments -Recommended to continue current medication; Call if home BP consistently above 150/90.   Hyperlipidemia: (LDL goal < 100) -Not ideally controlled - Pravastatin increased to 40 mg at last visit (12/21) then placed on hold Feb 2022 due to scalp changes. Pt is now tolerating twice weekly dosing without issues. Zetia remained unchanged. LDL above goal. -Current treatment: . Pravastatin 20 mg - 1/2 tablet every Monday and Friday  . Zetia 10 mg - 1 tablet daily  -Medications previously tried: atorvastatin - myalgia  -Reports he has been on 1/2 table pravastatin two day/week about 3 weeks. Feeling good on this so far. He is working on increasing the dose slowly with PCP. He reports he did not notice much change in scalp irritation/hair loss when holding pravastatin. -Current dietary patterns: denies changes - tries to limit portions, see initial visit -Current exercise habits: yard maintenance, none formal  -Educated on Cholesterol goals;  -Recommended to continue current medication; Increase slowly - try increasing pravastatin to 1 tablet twice weekly.   Diabetes (A1c goal <7%) Query-Controlled - tolerating metformin dose increase well, fasting BG controlled, pending updated A1c next month -Current medications: 3. Metformin 500 mg - 2 tablets AM, 2 tablets in the evening (increased 09/17/20) -Medications previously tried:  none   -Current home glucose readings - checking daily  . fasting glucose:  o 12/13/20 - 115 o 12/12/20 - 103 o 12/11/20 - 103 o 12/10/20 -116    11/18/20- 101  11/17/20- 107  11/16/20- 100  11/15/20- 105 . post prandial glucose: none  -Denies hypoglycemic/hyperglycemic symptoms -Recommended to continue current medication  Pt also reports doing well off Flomax and on Cialis 5 mg daily for BPH.  Patient Goals/Self-Care Activities . Patient will:  - take medications as prescribed check glucose daily, document, and provide at future appointments check blood pressure weekly, document, and provide at future appointments  Follow Up Plan: The care management team will reach out to the patient again over the next 90 days.  CMA call for BP and BG log review in April.   Medication Assistance: None required.  Patient affirms current coverage meets needs.       Patient's preferred pharmacy is:  [facility], [address] [place] [address] Phone: [phone] Fax: [phone]  Uses pill box? Yes Pt endorses 100% compliance  Care Plan and Follow Up Patient Decision:  Patient agrees to Care Plan and Follow-up.  [name], PharmD Clinical Pharmacist [facility] [phone]   Encounter details: CCM Time Spent      Value Time User   Time spent with patient (minutes)  60 12/14/2020  9:42 AM [name], [facility]   Time spent performing Chart review  30 12/14/2020  9:42 AM [name], [facility]   Total time (minutes)  90 12/14/2020  9:42 AM [name], RPH     Moderate to High Complex Decision Making      Value Time User   Moderate to High complex decision making  Yes 12/14/2020  9:42 AM [name], [facility]     CCM Services: This encounter meets complex CCM services and moderate to high decision making.  Prior to outreach and patient consent for Chronic Care Management, I referred this patient for  services after reviewing the nominated patient list or from a personal encounter with the patient.  I have personally reviewed this encounter including the documentation in this note and have collaborated with the care management provider regarding care management and care coordination activities to include development and update of the comprehensive care plan. I am certifying that I agree with the content of this note and encounter as supervising physician. Elsie Stain

## 2020-12-15 NOTE — Telephone Encounter (Signed)
I went back and checked in his record.  I thought I had sent information to cardiology after his most recent office visit.  I cannot find in the EMR where I did send a message.  At this point I can only presume that I failed to send the message.  I apologize.   I did put in the referral now and I will route this message to cardiology.  I have verified this time that it is in the EMR and I thank all involved.   Again I apologize to the patient.

## 2020-12-17 NOTE — Telephone Encounter (Signed)
Patient has an appt with Dr. Rockey Situ on 12/29/20 at 8:40 am.

## 2020-12-28 NOTE — Progress Notes (Signed)
Cardiology Office Note  Date:  12/29/2020   ID:  Thomas Todd, Thomas Todd [birth year], MRN [id number]  PCP:  Tonia Ghent, MD   Chief Complaint  Patient presents with  . Vasovagal syncope     Ref by Dr. Damita Dunnings for vasovagal syncope. Patient c/o dizziness with two spells of vasovagal syncope. Medications reviewed by the patient verbally.     HPI:  [name] is a 70 general with past medical history of Former smoker, quit 9  Quit smoking,quit 70 yo Diabetes Hba1c 7.4 Calcium score 160 Who presents for follow-up of prior episode of syncope, diabetes  Last seen in clinic by myself August 2019  Recent episode of syncope October 26, 2020 Was nauseous, and cramping in his abdomen and rectum, vomited multiple times, felt like he was benign diarrhea but never passed stool Felt lightheaded, diaphoretic, eventually passed out  In the ER blood pressure 825 systolic  No near syncope or syncope Was cutting back on pravastatin  Active at baseline Always active on the house, mowing  Prior syncope reviewed 2019 Was GI related, felt secondary to vasovagal syncope  Baseline note shortness of breath no chest pain  Orthostatics negative on today's visit  EKG personally reviewed by myself on todays visit Shows NSR with rate 64 bpm no significant ST or T-wave changes   PMH:   has a past medical history of Allergy, Anemia, Arthritis, Diabetes mellitus without complication ([place]), Dyspnea, GERD (gastroesophageal reflux disease), Hyperlipidemia, Hypertension, LBP (low back pain), and Pancreatitis.  PSH:    Past Surgical History:  Procedure Laterality Date  . COLONOSCOPY    . COLONOSCOPY WITH PROPOFOL N/A 06/07/2020   Procedure: COLONOSCOPY WITH PROPOFOL;  Surgeon: Lin Landsman, MD;  Location: [facility] ENDOSCOPY;  Service: Gastroenterology;  Laterality: N/A;  . dental implant    . LAMINECTOMY  1993   L5/S1  . SEPTOPLASTY  1970    Current Outpatient Medications  Medication  Sig Dispense Refill  . Ascorbic Acid (VITAMIN C) 1000 MG tablet Take 1,000 mg by mouth daily.    Marland Kitchen aspirin EC 81 MG tablet Take 81 mg by mouth daily.    . Blood Glucose Monitoring Suppl (ONE TOUCH ULTRA 2) w/Device KIT Use as instructed to check blood sugar daily as needed.  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 1 each 0  . Cholecalciferol (VITAMIN D-3) 25 MCG (1000 UT) CAPS Take 2 capsules (2,000 Units total) by mouth daily.    Marland Kitchen docusate sodium (COLACE) 100 MG capsule Take 100 mg by mouth 2 (two) times daily.    Marland Kitchen esomeprazole (NEXIUM) 40 MG capsule TAKE 1 CAPSULE BY MOUTH DAILY AT 12 NOON 90 capsule 1  . ezetimibe (ZETIA) 10 MG tablet TAKE 1 TABLET BY MOUTH ONCE DAILY 90 tablet 1  . fluticasone (FLONASE) 50 MCG/ACT nasal spray PLACE 1 SPRAY INTO BOTH NOSTRILS TWICE DAILY 48 g 3  . folic acid (FOLVITE) 003 MCG tablet Take 400 mcg by mouth daily.    Marland Kitchen glucose blood (ONE TOUCH ULTRA TEST) test strip Use as instructed to check blood sugar daily as needed.  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 100 each 3  . Lancets (ONETOUCH ULTRASOFT) lancets Use as instructed to check blood sugar daily as needed.  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 100 each 3  . levocetirizine (XYZAL) 5 MG tablet Take 1 tablet (5 mg total) by mouth every evening. 90 tablet 3  . lisinopril-hydrochlorothiazide (ZESTORETIC) 10-12.5 MG tablet Take  0.5 tablets by mouth daily. 45 tablet 3  . metFORMIN (GLUCOPHAGE) 500 MG tablet Take 2 tablets in the morning and in the evening. 360 tablet 3  . Omega-3 Fatty Acids (FISH OIL PO) Take 2,400 mg by mouth daily.    . potassium chloride (KLOR-CON) 10 MEQ tablet TAKE 1 TABLET BY MOUTH 2 TIMES DAILY 180 tablet 1  . pravastatin (PRAVACHOL) 20 MG tablet TAKE 1 TABLET BY MOUTH DAILY (Patient taking differently: TAKE 1 TABLET BY MOUTH DAILY (Current dose - 1 tablet on Monday and Friday only)) 90 tablet 1  . tadalafil (CIALIS) 5 MG tablet Take 1 tablet (5 mg total) by mouth daily. For  BPH 30 tablet 5  . vitamin B-12 (CYANOCOBALAMIN) 1000 MCG tablet Take 1 tablet (1,000 mcg total) by mouth daily.     No current facility-administered medications for this visit.    Allergies:   Atorvastatin   Social History:  The patient  reports that he quit smoking about 11 years ago. He quit after 40.00 years of use. He has never used smokeless tobacco. He reports that he does not drink alcohol and does not use drugs.   Family History:   family history includes Alcohol abuse in his father; Dementia in his mother; Diabetes in his mother and sister; Heart attack in his father; Heart disease in his father; Hyperlipidemia in his mother; Hypertension in his mother; Hypothyroidism in his mother.    Review of Systems: Review of Systems  Constitutional: Negative.   Respiratory: Negative.   Cardiovascular: Negative.   Gastrointestinal: Negative.   Musculoskeletal: Negative.   Neurological: Positive for loss of consciousness.  Psychiatric/Behavioral: Negative.   All other systems reviewed and are negative.   PHYSICAL EXAM: VS:  BP 140/80 (BP Location: Left Arm, Patient Position: Sitting, Cuff Size: Normal)   Pulse 64   Ht 5' 9"  (1.753 m)   Wt 187 lb (84.8 kg)   SpO2 99%   BMI 27.62 kg/m  , BMI Body mass index is 27.62 kg/m. Constitutional:  oriented to person, place, and time. No distress.  HENT:  Head: Grossly normal Eyes:  no discharge. No scleral icterus.  Neck: No JVD, no carotid bruits  Cardiovascular: Regular rate and rhythm, no murmurs appreciated Pulmonary/Chest: Clear to auscultation bilaterally, no wheezes or rails Abdominal: Soft.  no distension.  no tenderness.  Musculoskeletal: Normal range of motion Neurological:  normal muscle tone. Coordination normal. No atrophy Skin: Skin warm and dry Psychiatric: normal affect, pleasant   Recent Labs: 09/09/2020: ALT 43; TSH 2.30 10/26/2020: BUN 9; Creatinine, Ser 0.76; Hemoglobin 12.4; Platelets 205; Potassium 3.9; Sodium  139    Lipid Panel Lab Results  Component Value Date   CHOL 184 09/09/2020   HDL 47.20 09/09/2020   LDLCALC 75 01/01/2020   TRIG 213.0 (H) 09/09/2020      Wt Readings from Last 3 Encounters:  12/29/20 187 lb (84.8 kg)  11/01/20 189 lb (85.7 kg)  10/26/20 188 lb 4.4 oz (85.4 kg)       ASSESSMENT AND PLAN:  Vasovagal syncope - Plan: EKG 12-Lead Likely GI related in the setting of intestinal distress Same as in 2019 Discussed strategies such as lying down on the ground when he has near syncope Recommend he stay hydrated Try to avoid fast food  Type 2 diabetes mellitus with complication, with long-term current use of insulin (HCC) Hemoglobin A1c numbers discussed with him Reports sugars are better controlled  Former smoker Reports that he stop smoking many years  ago Does some vapor  Mixed hyperlipidemia Low calcium score Recommend he go back on pravastatin daily Was worried about hair loss on statins, typically we do not see this   Total encounter time more than 25 minutes  Greater than 50% was spent in counseling and coordination of care with the patient    Orders Placed This Encounter  Procedures  . EKG 12-Lead     Signed, Esmond Plants, M.D., Ph.D. 12/29/2020  [place], [place]

## 2020-12-29 ENCOUNTER — Other Ambulatory Visit: Payer: Self-pay

## 2020-12-29 ENCOUNTER — Ambulatory Visit (INDEPENDENT_AMBULATORY_CARE_PROVIDER_SITE_OTHER): Payer: PPO | Admitting: Cardiovascular Disease

## 2020-12-29 ENCOUNTER — Encounter: Payer: Self-pay | Admitting: Cardiovascular Disease

## 2020-12-29 VITALS — BP 140/80 | HR 64 | Ht 69.0 in | Wt 187.0 lb

## 2020-12-29 DIAGNOSIS — E782 Mixed hyperlipidemia: Secondary | ICD-10-CM

## 2020-12-29 DIAGNOSIS — E119 Type 2 diabetes mellitus without complications: Secondary | ICD-10-CM | POA: Diagnosis not present

## 2020-12-29 DIAGNOSIS — I10 Essential (primary) hypertension: Secondary | ICD-10-CM | POA: Diagnosis not present

## 2020-12-29 DIAGNOSIS — R55 Syncope and collapse: Secondary | ICD-10-CM | POA: Diagnosis not present

## 2020-12-29 NOTE — Patient Instructions (Addendum)
Medication Instructions:  No changes  If you need a refill on your cardiac medications before your next appointment, please call your pharmacy.    Lab work: No new labs needed   If you have labs (blood work) drawn today and your tests are completely normal, you will receive your results only by: . MyChart Message (if you have MyChart) OR . A paper copy in the mail If you have any lab test that is abnormal or we need to change your treatment, we will call you to review the results.   Testing/Procedures: No new testing needed   Follow-Up: At [facility], you and your health needs are our priority.  As part of our continuing mission to provide you with exceptional heart care, we have created designated Provider Care Teams.  These Care Teams include your primary Cardiologist (physician) and Advanced Practice Providers (APPs -  Physician Assistants and Nurse Practitioners) who all work together to provide you with the care you need, when you need it.  . You will need a follow up appointment as needed  . Providers on your designated Care Team:   . [name] Berge, NP . Ryan Dunn, PA-C . Jacquelyn Visser, PA-C  Any Other Special Instructions Will Be Listed Below (If Applicable).  COVID-19 Vaccine Information can be found at: [email or web address] For questions related to vaccine distribution or appointments, please email [email or web address] or call [phone].     

## 2021-01-03 ENCOUNTER — Ambulatory Visit: Payer: PPO

## 2021-01-03 ENCOUNTER — Telehealth: Payer: Self-pay

## 2021-01-03 NOTE — Chronic Care Management (AMB) (Addendum)
Chronic Care Management Pharmacy Assistant   Name: Thomas Todd  MRN: [id number] DOB: 09/29/1950   Reason for Encounter: Disease State    Conditions to be addressed/monitored: HTN  Recent office visits:  None since last CCM contact  Recent consult visits:  12/29/2020  Dr. Ida Rogue, Cardiology  Hospital visits:  10/26/2020  ED Visit, [place] - Syncope, Head Injury  Medications: Outpatient Encounter Medications as of 01/03/2021  Medication Sig   Ascorbic Acid (VITAMIN C) 1000 MG tablet Take 1,000 mg by mouth daily.   aspirin EC 81 MG tablet Take 81 mg by mouth daily.   Blood Glucose Monitoring Suppl (ONE TOUCH ULTRA 2) w/Device KIT Use as instructed to check blood sugar daily as needed.  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   Cholecalciferol (VITAMIN D-3) 25 MCG (1000 UT) CAPS Take 2 capsules (2,000 Units total) by mouth daily.   docusate sodium (COLACE) 100 MG capsule Take 100 mg by mouth 2 (two) times daily.   esomeprazole (NEXIUM) 40 MG capsule TAKE 1 CAPSULE BY MOUTH DAILY AT 12 NOON   ezetimibe (ZETIA) 10 MG tablet TAKE 1 TABLET BY MOUTH ONCE DAILY   fluticasone (FLONASE) 50 MCG/ACT nasal spray PLACE 1 SPRAY INTO BOTH NOSTRILS TWICE DAILY   folic acid (FOLVITE) 941 MCG tablet Take 400 mcg by mouth daily.   glucose blood (ONE TOUCH ULTRA TEST) test strip Use as instructed to check blood sugar daily as needed.  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   Lancets (ONETOUCH ULTRASOFT) lancets Use as instructed to check blood sugar daily as needed.  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   levocetirizine (XYZAL) 5 MG tablet Take 1 tablet (5 mg total) by mouth every evening.   lisinopril-hydrochlorothiazide (ZESTORETIC) 10-12.5 MG tablet Take 0.5 tablets by mouth daily.   metFORMIN (GLUCOPHAGE) 500 MG tablet Take 2 tablets in the morning and in the evening.   Omega-3 Fatty Acids (FISH OIL PO) Take 2,400 mg by mouth daily.   potassium chloride (KLOR-CON) 10 MEQ  tablet TAKE 1 TABLET BY MOUTH 2 TIMES DAILY   pravastatin (PRAVACHOL) 20 MG tablet TAKE 1 TABLET BY MOUTH DAILY (Patient taking differently: TAKE 1 TABLET BY MOUTH DAILY (Current dose - 1 tablet on Monday and Friday only))   tadalafil (CIALIS) 5 MG tablet Take 1 tablet (5 mg total) by mouth daily. For BPH   vitamin B-12 (CYANOCOBALAMIN) 1000 MCG tablet Take 1 tablet (1,000 mcg total) by mouth daily.   No facility-administered encounter medications on file as of 01/03/2021.    Recent Office Vitals: BP Readings from Last 3 Encounters:  12/29/20 140/80  11/01/20 (!) 144/78  10/26/20 137/82   Pulse Readings from Last 3 Encounters:  12/29/20 64  11/01/20 77  10/26/20 74    Wt Readings from Last 3 Encounters:  12/29/20 187 lb (84.8 kg)  11/01/20 189 lb (85.7 kg)  10/26/20 188 lb 4.4 oz (85.4 kg)     Kidney Function Lab Results  Component Value Date/Time   CREATININE 0.76 10/26/2020 11:13 AM   CREATININE 0.85 09/09/2020 08:28 AM   GFR 90.06 09/09/2020 08:28 AM   GFRNONAA >60 10/26/2020 11:13 AM   GFRAA 113 04/03/2008 09:55 AM    BMP Latest Ref Rng & Units 10/26/2020 09/09/2020 01/01/2020  Glucose 70 - 99 mg/dL 168(H) 147(H) 129(H)  BUN 8 - 23 mg/dL 9 13 13   Creatinine 0.61 - 1.24 mg/dL 0.76 0.85 0.78  Sodium 135 - 145 mmol/L  139 140 144  Potassium 3.5 - 5.1 mmol/L 3.9 4.1 3.7  Chloride 98 - 111 mmol/L 102 102 106  CO2 22 - 32 mmol/L 25 31 31   Calcium 8.9 - 10.3 mg/dL 9.9 9.7 9.2   Current antihypertensive regimen:  Lisinopril/HCTZ 10/12.5 mg - 1/2 tablet daily   Potassium chloride 10 mEq - 1 tablet BID    Patient verbally confirms he is taking the above medications as directed. Yes  How often are you checking your Blood Pressure? infrequently   The patient reports he does not think to check his BP at home, he will try to do this in the future.   Current home BP readings: N/A  The patient gave me BG readings as  follows:  01/03/2021 Fasting 92  01/02/2021 Fasting 103  01/01/2021 Fasting 105  12/30/2020 Fasting 93  12/29/2020 Fasting 93  12/28/2020 Fasting 100  12/27/2020 Fasting 102  12/26/2020 Fasting 100    Wrist or arm cuff: arm cuff   Caffeine intake:  He drinks 4 cups of coffee a day. Salt intake:  He likes salt but tries to limit by not adding to cooked food.  OTC medications including pseudoephedrine or NSAIDs? N/A  Any readings above 180/120? No If yes any symptoms of hypertensive emergency? patient denies any symptoms of high blood pressure   What recent interventions/DTPs have been made by any provider to improve Blood Pressure control since last CPP Visit:    Call if home BP consistently above 150/90.   Any recent hospitalizations or ED visits since last visit with CPP? Yes   10/26/2020 - Syncope, Head Injury  What diet changes have been made to improve Blood Pressure Control? None Identified  What exercise is being done to improve your Blood Pressure Control?   The patient states he gets out and mows the grass, he works out in the yard, he is very active and stays busy around the house.   Adherence Review: Is the patient currently on ACE/ARB medication? Yes Does the patient have >5 day gap between last estimated fill dates? No gaps in adherence   Star Rating Drugs:  Medication:  Last Fill: Day Supply Lisinopril 10-12.5 mg. 11/02/2020 90ds Pravastatin 20 mg 11/02/2020 90ds. Metformin 500 mg. 12/20/2020 90ds  Follow-Up:  Pharmacist Review  [name], CPP notified  Avel Sensor, Rose Assistant [phone]  I have reviewed the care management and care coordination activities outlined in this encounter and I am certifying that I agree with the content of this note. No further action required.  [name], PharmD Clinical Pharmacist [facility] [phone]

## 2021-01-12 ENCOUNTER — Ambulatory Visit (INDEPENDENT_AMBULATORY_CARE_PROVIDER_SITE_OTHER): Payer: PPO

## 2021-01-12 VITALS — BP 149/78 | Wt 182.0 lb

## 2021-01-12 DIAGNOSIS — Z0000 Encounter for general adult medical examination without abnormal findings: Secondary | ICD-10-CM | POA: Diagnosis not present

## 2021-01-12 NOTE — Progress Notes (Signed)
Subjective:   Thomas Todd is a 70 y.o. male who presents for Medicare Annual/Subsequent preventive examination.  Review of Systems: N/A     I connected with the patient today by telephone and verified that I am speaking with the correct person using two identifiers. Location patient: home Location nurse: work Persons participating in the telephone visit: patient, nurse.   I discussed the limitations, risks, security and privacy concerns of performing an evaluation and management service by telephone and the availability of in person appointments. I also discussed with the patient that there may be a patient responsible charge related to this service. The patient expressed understanding and verbally consented to this telephonic visit.        Cardiac Risk Factors include: advanced age (>75mn, >>59women);diabetes mellitus;hypertension;male gender     Objective:    Today's Vitals   01/12/21 1024  BP: (!) 149/78  Weight: 182 lb (82.6 kg)   Body mass index is 26.88 kg/m.  Advanced Directives 01/12/2021 06/07/2020 01/01/2020 05/04/2017 03/17/2014  Does Patient Have a Medical Advance Directive? Yes Yes No Yes Patient does not have advance directive  Type of Advance Directive [place] - - [place] Chart? No - copy requested - - - -  Would patient like information on creating a medical advance directive? No - Patient declined - Yes (MAU/Ambulatory/Procedural Areas - Information given) - -    Current Medications (verified) Outpatient Encounter Medications as of 01/12/2021  Medication Sig  . Ascorbic Acid (VITAMIN C) 1000 MG tablet Take 1,000 mg by mouth daily.  .Marland Kitchenaspirin EC 81 MG tablet Take 81 mg by mouth daily.  . Blood Glucose Monitoring Suppl (ONE TOUCH ULTRA 2) w/Device KIT Use as instructed to check blood sugar daily as needed.  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  .  Cholecalciferol (VITAMIN D-3) 25 MCG (1000 UT) CAPS Take 2 capsules (2,000 Units total) by mouth daily.  .Marland Kitchendocusate sodium (COLACE) 100 MG capsule Take 100 mg by mouth 2 (two) times daily.  .Marland Kitchenesomeprazole (NEXIUM) 40 MG capsule TAKE 1 CAPSULE BY MOUTH DAILY AT 12 NOON  . ezetimibe (ZETIA) 10 MG tablet TAKE 1 TABLET BY MOUTH ONCE DAILY  . fluticasone (FLONASE) 50 MCG/ACT nasal spray PLACE 1 SPRAY INTO BOTH NOSTRILS TWICE DAILY  . folic acid (FOLVITE) 4264MCG tablet Take 400 mcg by mouth daily.  .Marland Kitchenglucose blood (ONE TOUCH ULTRA TEST) test strip Use as instructed to check blood sugar daily as needed.  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  . Lancets (ONETOUCH ULTRASOFT) lancets Use as instructed to check blood sugar daily as needed.  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  .Marland Kitchenlevocetirizine (XYZAL) 5 MG tablet Take 1 tablet (5 mg total) by mouth every evening.  .Marland Kitchenlisinopril-hydrochlorothiazide (ZESTORETIC) 10-12.5 MG tablet Take 0.5 tablets by mouth daily.  . metFORMIN (GLUCOPHAGE) 500 MG tablet Take 2 tablets in the morning and in the evening.  . Omega-3 Fatty Acids (FISH OIL PO) Take 2,400 mg by mouth daily.  . potassium chloride (KLOR-CON) 10 MEQ tablet TAKE 1 TABLET BY MOUTH 2 TIMES DAILY  . pravastatin (PRAVACHOL) 20 MG tablet TAKE 1 TABLET BY MOUTH DAILY (Patient taking differently: TAKE 1 TABLET BY MOUTH DAILY (Current dose - 1 tablet on Monday and Friday only))  . tadalafil (CIALIS) 5 MG tablet Take 1 tablet (5 mg total) by mouth daily.  For BPH  . vitamin B-12 (CYANOCOBALAMIN) 1000 MCG tablet Take 1 tablet (1,000 mcg total) by mouth daily.   No facility-administered encounter medications on file as of 01/12/2021.    Allergies (verified) Atorvastatin   History: Past Medical History:  Diagnosis Date  . Allergy   . Anemia   . Arthritis   . Diabetes mellitus without complication ([place])    no medications taken 05-04-17  . Dyspnea   . GERD (gastroesophageal reflux disease)   .  Hyperlipidemia   . Hypertension   . LBP (low back pain)    with sciatica   . Pancreatitis    Pancreatitis etoh 08/1998   Past Surgical History:  Procedure Laterality Date  . COLONOSCOPY    . COLONOSCOPY WITH PROPOFOL N/A 06/07/2020   Procedure: COLONOSCOPY WITH PROPOFOL;  Surgeon: Lin Landsman, MD;  [facility] ENDOSCOPY;  Service: Gastroenterology;  Laterality: N/A;  . dental implant    . LAMINECTOMY  1993   L5/S1  . SEPTOPLASTY  1970   Family History  Problem Relation Age of Onset  . Hypertension Mother   . Diabetes Mother   . Hyperlipidemia Mother   . Hypothyroidism Mother   . Dementia Mother   . Alcohol abuse Father   . Heart disease Father   . Heart attack Father   . Diabetes Sister   . Colon cancer Neg Hx   . Prostate cancer Neg Hx   . Esophageal cancer Neg Hx   . Stomach cancer Neg Hx   . Rectal cancer Neg Hx    Social History   Socioeconomic History  . Marital status: Married    Spouse name: Not on file  . Number of children: Not on file  . Years of education: Not on file  . Highest education level: Not on file  Occupational History  . Not on file  Tobacco Use  . Smoking status: Former Smoker    Years: 40.00    Quit date: 09/17/2009    Years since quitting: 11.3  . Smokeless tobacco: Never Used  . Tobacco comment: vapor  Vaping Use  . Vaping Use: Every day  . Devices: vaping only   Substance and Sexual Activity  . 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 Drug use: No  . Sexual activity: Not on file  Other Topics Concern  . Not on file  Social History Narrative   Sober since 1999   Prev did [employer], worked for [employer]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, retired as of 2015   Married 1975   Prev had 1 daughter, was chronically ill and died 06-28-16   Social Determinants of Health   Financial Resource Strain: Low Risk   . Difficulty of Paying Living Expenses: Not hard at all  Food Insecurity: No Food Insecurity  . Worried About Paediatric nurse in the Last Year: Never true  . Ran Out of Food in the Last Year: Never true  Transportation Needs: No Transportation Needs  . Lack of Transportation (Medical): No  . Lack of Transportation (Non-Medical): No  Physical Activity: Inactive  . Days of Exercise per Week: 0 days  . Minutes of Exercise per Session: 0 min  Stress: No Stress Concern Present  . Feeling of Stress : Not at all  Social Connections: Not on file    Tobacco Counseling Counseling given: Not Answered Comment: vapor   Clinical Intake:  Pre-visit preparation completed: Yes  Pain : No/denies pain  Nutritional Risks: None Diabetes: Yes CBG done?: No Did pt. bring in CBG monitor from home?: No  How often do you need to have someone help you when you read instructions, pamphlets, or other written materials from your doctor or pharmacy?: 1 - Never  Diabetic:Yes Nutrition Risk Assessment:  Has the patient had any N/V/D within the last 2 months?  No  Does the patient have any non-healing wounds?  No  Has the patient had any unintentional weight loss or weight gain?  No   Diabetes:  Is the patient diabetic?  Yes  If diabetic, was a CBG obtained today?  No  telephone visit  Did the patient bring in their glucometer from home?  No  telephone visit  How often do you monitor your CBG's? daily.   Financial Strains and Diabetes Management:  Are you having any financial strains with the device, your supplies or your medication? No .  Does the patient want to be seen by Chronic Care Management for management of their diabetes?  No  Would the patient like to be referred to a Nutritionist or for Diabetic Management?  No   Diabetic Exams:  Diabetic Eye Exam: Completed 02/16/2021 Diabetic Foot Exam: Overdue, Pt has been advised about the importance in completing this exam. Pt is scheduled for diabetic foot exam on 01/18/2021.   Interpreter Needed?: No  Information entered by :: CJohnson,  LPN   Activities of Daily Living In your present state of health, do you have any difficulty performing the following activities: 01/12/2021  Hearing? N  Vision? N  Difficulty concentrating or making decisions? N  Walking or climbing stairs? N  Dressing or bathing? N  Doing errands, shopping? N  Preparing Food and eating ? N  Using the Toilet? N  In the past six months, have you accidently leaked urine? N  Do you have problems with loss of bowel control? N  Managing your Medications? N  Managing your Finances? N  Housekeeping or managing your Housekeeping? N  Some recent data might be hidden    Patient Care Team: Tonia Ghent, MD as PCP - General (Family Medicine) [name], [facility] as Pharmacist (Pharmacist)  Indicate any recent Medical Services you may have received from other than [facility] providers in the past year (date may be approximate).     Assessment:   This is a routine wellness examination for Keontay.  Hearing/Vision screen  Hearing Screening   _0  _1  _2  _3  _4  _5  _6  _7  _8   Right ear:           Left ear:           Vision Screening Comments: Patient gets annual eye exams   Dietary issues and exercise activities discussed: Current Exercise Habits: The patient does not participate in regular exercise at present, Exercise limited by: None identified  Goals    . Patient Stated     01/01/2020, I will maintain and continue medications as prescribed.     . Patient Stated     01/12/2021, I will maintain and continue medications as prescribed.     . Pharmacy Care Plan     CARE PLAN ENTRY (see longitudinal plan of care for additional care plan information)  Current Barriers:  . Chronic Disease Management support, education, and care coordination needs related to Hyperlipidemia, Diabetes, and BPH   Hyperlipidemia Lab Results  Component Value Date/Time   LDLCALC 75 01/01/2020 07:34 AM   LDLDIRECT 113.0 09/09/2020 08:28 AM    .  Pharmacist Clinical Goal(s): o Over the next 30 days, patient will work with PharmD and providers to achieve LDL goal < 100 . Current regimen:  . Pravastatin 20 mg - 1 tablet daily . Zetia 10 mg - 1 tablet daily  . Interventions: o Recommend increasing pravastatin to 40 mg daily - consult PCP . Patient self care activities - Over the next 30 days, patient will: o Continue daily adherence to medications  Diabetes Lab Results  Component Value Date/Time   HGBA1C 7.5 (H) 09/09/2020 08:28 AM   HGBA1C 8.1 (H) 05/03/2020 07:53 AM   . Pharmacist Clinical Goal(s): o Over the next 30 days, patient will work with PharmD and providers to achieve A1c goal <7% . Current regimen:   Metformin 500 mg - 1 tablet AM, 2 tablets in the evening  . Interventions: o Recommend increasing metformin to 2 tablets twice daily - consult PCP . Patient self care activities - Over the next 30 days, patient will: o Check blood sugar once daily, document, and provide at future appointments o Contact provider with any episodes of hypoglycemia  BPH . Pharmacist Clinical Goal(s) o Over the next 30 days, patient will work with PharmD and providers to improve frequent urination . Current regimen:  o Tamsulosin 0.4 mg - 1 capsule daily . Interventions: o Reviewed medication efficacy - pt denies any benefit, recommend discontinuation of Flomax  o Will let Dr. Damita Dunnings know of patient interest in Cialis . Patient self care activities - Over the next 30 days, patient will: o Continue current medications and discuss with Dr. Damita Dunnings your interest in Cialis for BPH and ED symptoms.   Initial goal documentation       Depression Screen PHQ 2/9 Scores 01/12/2021 09/20/2020 01/01/2020 11/13/2017 11/07/2016  PHQ - 2 Score 0 0 0 0 0  PHQ- 9 Score 0 - 0 - -    Fall Risk Fall Risk  01/12/2021 09/20/2020 01/01/2020 07/16/2018 11/07/2016  Falls in the past year? 0 0 0 No No  Number falls in past yr: 0 0 0 - -  Injury with Fall?  0 0 0 - -  Risk for fall due to : - - Medication side effect - -  Follow up - Falls evaluation completed Falls prevention discussed;Falls evaluation completed - -    FALL RISK PREVENTION PERTAINING TO THE HOME:  Any stairs in or around the home? Yes  If so, are there any without handrails? No  Home free of loose throw rugs in walkways, pet beds, electrical cords, etc? Yes  Adequate lighting in your home to reduce risk of falls? Yes   ASSISTIVE DEVICES UTILIZED TO PREVENT FALLS:  Life alert? No  Use of a cane, walker or w/c? No  Grab bars in the bathroom? No  Shower chair or bench in shower? No  Elevated toilet seat or a handicapped toilet? No   TIMED UP AND GO:  Was the test performed? N/A telephone visit .    Cognitive Function: MMSE - Mini Mental State Exam 01/12/2021 01/01/2020  Not completed: Unable to complete -  Orientation to time - 5  Orientation to Place - 5  Registration - 3  Attention/ Calculation - 5  Recall - 3  Language- repeat - 1  Mini Cog  Mini-Cog screen was not completed. Phone connection was terrible. Patient could not hear most of the time. Maximum score is 22. A value of 0 denotes this part of the MMSE was not completed or the patient  failed this part of the Mini-Cog screening.       Immunizations Immunization History  Administered Date(s) Administered  . Fluad Quad(high Dose 65+) 11/01/2020  . Influenza Split 07/26/2012  . Influenza Whole 11/11/2009  . Influenza,inj,Quad PF,6+ Mos 07/30/2013, 07/03/2014, 06/28/2015, 06/15/2016, 07/05/2017, 06/03/2018, 05/08/2019  . PFIZER(Purple Top)SARS-COV-2 Vaccination 05/24/2020, 06/14/2020  . Pneumococcal Conjugate-13 07/16/2018  . Pneumococcal Polysaccharide-23 01/26/2015  . Td 03/17/2002  . Tdap 02/13/2012  . Zoster 09/03/2014  . Zoster Recombinat (Shingrix) 01/23/2018, 03/25/2018    TDAP status: Up to date  Flu Vaccine status: Up to date  Pneumococcal vaccine status: Due, Education has been  provided regarding the importance of this vaccine. Advised may receive this vaccine at local pharmacy or Health Dept. Aware to provide a copy of the vaccination record if obtained from local pharmacy or Health Dept. Verbalized acceptance and understanding.  Covid-19 vaccine status: 2 doses completed. Booster due. Patient aware will talk with provider at physical  Qualifies for Shingles Vaccine? Yes   Zostavax completed Yes   Shingrix Completed?: Yes  Screening Tests Health Maintenance  Topic Date Due  . PNA vac Low Risk Adult (2 of 2 - PPSV23) 01/26/2020  . OPHTHALMOLOGY EXAM  04/13/2020  . COVID-19 Vaccine (3 - Booster for Pfizer series) 12/12/2020  . FOOT EXAM  01/07/2021  . HEMOGLOBIN A1C  03/10/2021  . INFLUENZA VACCINE  04/18/2021  . TETANUS/TDAP  02/12/2022  . COLONOSCOPY (Pts 45-85yr Insurance coverage will need to be confirmed)  06/08/2023  . Hepatitis C Screening  Completed  . HPV VACCINES  Aged Out    Health Maintenance  Health Maintenance Due  Topic Date Due  . PNA vac Low Risk Adult (2 of 2 - PPSV23) 01/26/2020  . OPHTHALMOLOGY EXAM  04/13/2020  . COVID-19 Vaccine (3 - Booster for Pfizer series) 12/12/2020  . FOOT EXAM  01/07/2021    Colorectal cancer screening: Type of screening: Colonoscopy. Completed 06/07/2020. Repeat every 3 years  Lung Cancer Screening: (Low Dose CT Chest recommended if Age 68-80years, 30 pack-year currently smoking OR have quit w/in 15years.) does not qualify.   Additional Screening:  Hepatitis C Screening: does qualify; Completed 04/25/2016  Vision Screening: Recommended annual ophthalmology exams for early detection of glaucoma and other disorders of the eye. Is the patient up to date with their annual eye exam?  Yes  Who is the provider or what is the name of the office in which the patient attends annual eye exams? [facility] If pt is not established with a provider, would they like to be referred to a provider to establish care? No  .   Dental Screening: Recommended annual dental exams for proper oral hygiene  Community Resource Referral / Chronic Care Management: CRR required this visit?  No   CCM required this visit?  No      Plan:     I have personally reviewed and noted the following in the patient's chart:   . Medical and social history . Use of alcohol, tobacco or illicit drugs  . Current medications and supplements . Functional ability and status . Nutritional status . Physical activity . Advanced directives . List of other physicians . Hospitalizations, surgeries, and ER visits in previous 12 months . Vitals . Screenings to include cognitive, depression, and falls . Referrals and appointments  In addition, I have reviewed and discussed with patient certain preventive protocols, quality metrics, and best practice recommendations. A written personalized care plan for preventive services as well as  general preventive health recommendations were provided to patient.   Due to this being a telephonic visit, the after visit summary with patients personalized plan was offered to patient via office or my-chart. Patient preferred to pick up at office at next visit or via mychart.   Igor, Bishop, LPN   10/09/5832

## 2021-01-12 NOTE — Patient Instructions (Signed)
Thomas Todd , Thank you for taking time to come for your Medicare Wellness Visit. I appreciate your ongoing commitment to your health goals. Please review the following plan we discussed and let me know if I can assist you in the future.   Screening recommendations/referrals: Colonoscopy: Up to date, completed 06/07/2020 due 05/2023 Recommended yearly ophthalmology/optometry visit for glaucoma screening and checkup Recommended yearly dental visit for hygiene and checkup  Vaccinations: Influenza vaccine: Up to date, completed 11/01/2020, due 04/2021 Pneumococcal vaccine: due, will discuss with provider Tdap vaccine: Up to date, completed 02/13/2012, due 01/2022 Shingles vaccine: Completed series   Covid-19: 2 doses completed. Booster due. Patient aware will talk with provider at physical  Advanced directives: Please bring a copy of your POA (Power of [place]) and/or Living Will to your next appointment.   Conditions/risks identified: diabetes, hypertension   Next appointment: Follow up in one year for your annual wellness visit.   Preventive Care 28 Years and Older, Male Preventive care refers to lifestyle choices and visits with your health care provider that can promote health and wellness. What does preventive care include?  A yearly physical exam. This is also called an annual well check.  Dental exams once or twice a year.  Routine eye exams. Ask your health care provider how often you should have your eyes checked.  Personal lifestyle choices, including:  Daily care of your teeth and gums.  Regular physical activity.  Eating a healthy diet.  Avoiding tobacco and drug use.  Limiting alcohol use.  Practicing safe sex.  Taking low doses of aspirin every day.  Taking vitamin and mineral supplements as recommended by your health care provider. What happens during an annual well check? The services and screenings done by your health care provider during your annual well  check will depend on your age, overall health, lifestyle risk factors, and family history of disease. Counseling  Your health care provider may ask you questions about your:  Alcohol use.  Tobacco use.  Drug use.  Emotional well-being.  Home and relationship well-being.  Sexual activity.  Eating habits.  History of falls.  Memory and ability to understand (cognition).  Work and work Statistician. Screening  You may have the following tests or measurements:  Height, weight, and BMI.  Blood pressure.  Lipid and cholesterol levels. These may be checked every 5 years, or more frequently if you are over 53 years old.  Skin check.  Lung cancer screening. You may have this screening every year starting at age 87 if you have a 30-pack-year history of smoking and currently smoke or have quit within the past 15 years.  Fecal occult blood test (FOBT) of the stool. You may have this test every year starting at age 65.  Flexible sigmoidoscopy or colonoscopy. You may have a sigmoidoscopy every 5 years or a colonoscopy every 10 years starting at age 37.  Prostate cancer screening. Recommendations will vary depending on your family history and other risks.  Hepatitis C blood test.  Hepatitis B blood test.  Sexually transmitted disease (STD) testing.  Diabetes screening. This is done by checking your blood sugar (glucose) after you have not eaten for a while (fasting). You may have this done every 1-3 years.  Abdominal aortic aneurysm (AAA) screening. You may need this if you are a current or former smoker.  Osteoporosis. You may be screened starting at age 72 if you are at high risk. Talk with your health care provider about your test results,  treatment options, and if necessary, the need for more tests. Vaccines  Your health care provider may recommend certain vaccines, such as:  Influenza vaccine. This is recommended every year.  Tetanus, diphtheria, and acellular  pertussis (Tdap, Td) vaccine. You may need a Td booster every 10 years.  Zoster vaccine. You may need this after age 46.  Pneumococcal 13-valent conjugate (PCV13) vaccine. One dose is recommended after age 85.  Pneumococcal polysaccharide (PPSV23) vaccine. One dose is recommended after age 66. Talk to your health care provider about which screenings and vaccines you need and how often you need them. This information is not intended to replace advice given to you by your health care provider. Make sure you discuss any questions you have with your health care provider. Document Released: 10/01/2015 Document Revised: 05/24/2016 Document Reviewed: 07/06/2015 Elsevier Interactive Patient Education  2017 [place] Prevention in the Home Falls can cause injuries. They can happen to people of all ages. There are many things you can do to make your home safe and to help prevent falls. What can I do on the outside of my home?  Regularly fix the edges of walkways and driveways and fix any cracks.  Remove anything that might make you trip as you walk through a door, such as a raised step or threshold.  Trim any bushes or trees on the path to your home.  Use bright outdoor lighting.  Clear any walking paths of anything that might make someone trip, such as rocks or tools.  Regularly check to see if handrails are loose or broken. Make sure that both sides of any steps have handrails.  Any raised decks and porches should have guardrails on the edges.  Have any leaves, snow, or ice cleared regularly.  Use sand or salt on walking paths during winter.  Clean up any spills in your garage right away. This includes oil or grease spills. What can I do in the bathroom?  Use night lights.  Install grab bars by the toilet and in the tub and shower. Do not use towel bars as grab bars.  Use non-skid mats or decals in the tub or shower.  If you need to sit down in the shower, use a plastic,  non-slip stool.  Keep the floor dry. Clean up any water that spills on the floor as soon as it happens.  Remove soap buildup in the tub or shower regularly.  Attach bath mats securely with double-sided non-slip rug tape.  Do not have throw rugs and other things on the floor that can make you trip. What can I do in the bedroom?  Use night lights.  Make sure that you have a light by your bed that is easy to reach.  Do not use any sheets or blankets that are too big for your bed. They should not hang down onto the floor.  Have a firm chair that has side arms. You can use this for support while you get dressed.  Do not have throw rugs and other things on the floor that can make you trip. What can I do in the kitchen?  Clean up any spills right away.  Avoid walking on wet floors.  Keep items that you use a lot in easy-to-reach places.  If you need to reach something above you, use a strong step stool that has a grab bar.  Keep electrical cords out of the way.  Do not use floor polish or wax that makes floors  slippery. If you must use wax, use non-skid floor wax.  Do not have throw rugs and other things on the floor that can make you trip. What can I do with my stairs?  Do not leave any items on the stairs.  Make sure that there are handrails on both sides of the stairs and use them. Fix handrails that are broken or loose. Make sure that handrails are as long as the stairways.  Check any carpeting to make sure that it is firmly attached to the stairs. Fix any carpet that is loose or worn.  Avoid having throw rugs at the top or bottom of the stairs. If you do have throw rugs, attach them to the floor with carpet tape.  Make sure that you have a light switch at the top of the stairs and the bottom of the stairs. If you do not have them, ask someone to add them for you. What else can I do to help prevent falls?  Wear shoes that:  Do not have high heels.  Have rubber  bottoms.  Are comfortable and fit you well.  Are closed at the toe. Do not wear sandals.  If you use a stepladder:  Make sure that it is fully opened. Do not climb a closed stepladder.  Make sure that both sides of the stepladder are locked into place.  Ask someone to hold it for you, if possible.  Clearly mark and make sure that you can see:  Any grab bars or handrails.  First and last steps.  Where the edge of each step is.  Use tools that help you move around (mobility aids) if they are needed. These include:  Canes.  Walkers.  Scooters.  Crutches.  Turn on the lights when you go into a dark area. Replace any light bulbs as soon as they burn out.  Set up your furniture so you have a clear path. Avoid moving your furniture around.  If any of your floors are uneven, fix them.  If there are any pets around you, be aware of where they are.  Review your medicines with your doctor. Some medicines can make you feel dizzy. This can increase your chance of falling. Ask your doctor what other things that you can do to help prevent falls. This information is not intended to replace advice given to you by your health care provider. Make sure you discuss any questions you have with your health care provider. Document Released: 07/01/2009 Document Revised: 02/10/2016 Document Reviewed: 10/09/2014 Elsevier Interactive Patient Education  2017 [employer].

## 2021-01-12 NOTE — Progress Notes (Signed)
PCP notes:  Health Maintenance: Covid booster- due Pneumococcal- due Foot exam- due  Abnormal Screenings: none   Patient concerns: Elevated BP readings Wants generic Cialis prescribed.    Nurse concerns: none   Next PCP appt: 01/18/2021 @ 10:30 am

## 2021-01-18 ENCOUNTER — Encounter: Payer: Self-pay | Admitting: Family Medicine

## 2021-01-18 ENCOUNTER — Ambulatory Visit (INDEPENDENT_AMBULATORY_CARE_PROVIDER_SITE_OTHER): Payer: PPO | Admitting: Family Medicine

## 2021-01-18 ENCOUNTER — Other Ambulatory Visit: Payer: Self-pay

## 2021-01-18 VITALS — BP 140/80 | HR 77 | Temp 98.7°F | Ht 69.0 in | Wt 184.0 lb

## 2021-01-18 DIAGNOSIS — E782 Mixed hyperlipidemia: Secondary | ICD-10-CM

## 2021-01-18 DIAGNOSIS — Z125 Encounter for screening for malignant neoplasm of prostate: Secondary | ICD-10-CM | POA: Diagnosis not present

## 2021-01-18 DIAGNOSIS — I10 Essential (primary) hypertension: Secondary | ICD-10-CM | POA: Diagnosis not present

## 2021-01-18 DIAGNOSIS — Z7189 Other specified counseling: Secondary | ICD-10-CM

## 2021-01-18 DIAGNOSIS — E119 Type 2 diabetes mellitus without complications: Secondary | ICD-10-CM | POA: Diagnosis not present

## 2021-01-18 DIAGNOSIS — Z0000 Encounter for general adult medical examination without abnormal findings: Secondary | ICD-10-CM

## 2021-01-18 DIAGNOSIS — L989 Disorder of the skin and subcutaneous tissue, unspecified: Secondary | ICD-10-CM

## 2021-01-18 LAB — COMPREHENSIVE METABOLIC PANEL
ALT: 18 U/L (ref 0–53)
AST: 18 U/L (ref 0–37)
Albumin: 4.6 g/dL (ref 3.5–5.2)
Alkaline Phosphatase: 32 U/L — ABNORMAL LOW (ref 39–117)
BUN: 11 mg/dL (ref 6–23)
CO2: 30 mEq/L (ref 19–32)
Calcium: 10 mg/dL (ref 8.4–10.5)
Chloride: 102 mEq/L (ref 96–112)
Creatinine, Ser: 0.74 mg/dL (ref 0.40–1.50)
GFR: 93.67 mL/min (ref >60.00)
Glucose, Bld: 105 mg/dL — ABNORMAL HIGH (ref 70–99)
Potassium: 4.3 mEq/L (ref 3.5–5.1)
Sodium: 140 mEq/L (ref 135–145)
Total Bilirubin: 0.7 mg/dL (ref 0.2–1.2)
Total Protein: 6.4 g/dL (ref 6.0–8.3)

## 2021-01-18 LAB — LIPID PANEL
Cholesterol: 178 mg/dL (ref 0–200)
HDL: 45 mg/dL (ref >39.00)
LDL Cholesterol: 100 mg/dL — ABNORMAL HIGH (ref 0–99)
NonHDL: 133.31
Total CHOL/HDL Ratio: 4
Triglycerides: 165 mg/dL — ABNORMAL HIGH (ref 0.0–149.0)
VLDL: 33 mg/dL (ref 0.0–40.0)

## 2021-01-18 LAB — HEMOGLOBIN A1C: Hgb A1c MFr Bld: 6.1 % (ref 4.6–6.5)

## 2021-01-18 LAB — PSA, MEDICARE: PSA: 0.67 ng/ml (ref 0.10–4.00)

## 2021-01-18 MED ORDER — POTASSIUM CHLORIDE ER 10 MEQ PO TBCR: 10.0000 meq | EXTENDED_RELEASE_TABLET | Freq: Two times a day (BID) | ORAL | 3 refills | Status: DC

## 2021-01-18 MED ORDER — LISINOPRIL-HYDROCHLOROTHIAZIDE 10-12.5 MG PO TABS: 0.5000 | ORAL_TABLET | Freq: Every day | ORAL | 3 refills | Status: DC

## 2021-01-18 MED ORDER — BLOOD PRESSURE MONITOR KIT: PACK | 0 refills | Status: DC

## 2021-01-18 NOTE — Progress Notes (Signed)
This visit occurred during the SARS-CoV-2 public health emergency.  Safety protocols were in place, including screening questions prior to the visit, additional usage of staff PPE, and extensive cleaning of exam room while observing appropriate contact time as indicated for disinfecting solutions.  Flu 2021 Shingles up-to-date PNA up-to-date Tetanus 2013 covid vaccine up to date.   Colonoscopy 2021 Prostate cancer screening 2022 Advance directive-wife designated if patient were incapacitated.  Elevated Cholesterol: Using medications without problems: yes Muscle aches: no Diet compliance: yes Exercise:yes He restarted pravastatin.  He has been able to tolerate it.  D/w pt.  Still on zetia.  He didn't see much change back on pravasattin.    Diabetes:  Using medications without difficulties: Yes Hypoglycemic episodes:no Hyperglycemic episodes:no Feet problems:no Blood Sugars averaging: max 117, down to the 70s in the early AM.   eye exam within last year: yes Labs d/w pt.    Hypertension:    Using medication without problems or lightheadedness: yes Chest pain with exertion:no Edema:no Short of breath:no Syncope and cards eval d/w pt.  No syncope in the meantime.   He has whitish changes on the shaft w/o itching.  No help with topical meds.   Family stressors d/w pt.    We talked about referral back to GI re: hemorrhoid intervention.  Reasonable to defer until next colonoscopy, unless he has more symptoms in the meantime.  PMH and SH reviewed  Meds, vitals, and allergies reviewed.   ROS: Per HPI unless specifically indicated in ROS section   GEN: nad, alert and oriented HEENT: ncat NECK: supple w/o LA CV: rrr. PULM: ctab, no inc wob ABD: soft, +bs EXT: no edema SKIN: no acute rash Normal penile shaft and glans.  Diabetic foot exam: Normal inspection No skin breakdown No calluses  Normal DP pulses Normal sensation to light touch and monofilament Nails normal

## 2021-01-18 NOTE — Patient Instructions (Signed)
Go to the lab on the way out.   If you have mychart we'll likely use that to update you.    °Take care.  Glad to see you. °Update me as needed.   °

## 2021-01-19 NOTE — Assessment & Plan Note (Signed)
Advance directive- wife designated if patient were incapacitated.  

## 2021-01-19 NOTE — Assessment & Plan Note (Signed)
Continue work on diet and exercise.  Continue metformin.  See notes on labs.

## 2021-01-19 NOTE — Assessment & Plan Note (Signed)
His blood pressure cuff is running higher than I are calibrated cuff here in clinic.  Recheck blood pressure on our cuff is reasonable.  Continue work on diet and exercise.  Continue lisinopril hydrochlorothiazide.  He agrees with plan.

## 2021-01-19 NOTE — Assessment & Plan Note (Signed)
Flu 2021 Shingles up-to-date PNA up-to-date Tetanus 2013 covid vaccine up to date.   Colonoscopy 2021 Prostate cancer screening 2022 Advance directive-wife designated if patient were incapacitated.

## 2021-01-19 NOTE — Assessment & Plan Note (Signed)
Previously noted by patient.  Not noted on exam now.  No help with antifungal cream.  He can try a small amount of topical hydrocortisone to see if that helps if he has return of symptoms.  He will update me as needed.

## 2021-01-19 NOTE — Assessment & Plan Note (Signed)
Continue pravastatin and ezetimibe.  Labs pending.  Continue work on diet and exercise.  See notes on labs.

## 2021-01-31 ENCOUNTER — Other Ambulatory Visit: Payer: Self-pay | Admitting: Family Medicine

## 2021-01-31 DIAGNOSIS — J302 Other seasonal allergic rhinitis: Secondary | ICD-10-CM

## 2021-03-07 ENCOUNTER — Telehealth: Payer: Self-pay

## 2021-03-07 NOTE — Chronic Care Management (AMB) (Addendum)
Chronic Care Management Pharmacy Assistant   Name: Thomas Todd  MRN: [id number] DOB: 09-Nov-1950    Reason for Encounter: Reminder for CCM appointment 03/08/21  Recent office visits:  01/18/21- PCP- Medicare Wellness- The following labs were ordered: CMP, lipid panel, A1C, PSA. A1c down from 7.5% to 6.1%. No changes to medication.   Recent consult visits:  None since last [facility] visits:  10/26/20- ED visit. Syncope and collapse. No admission.   Medications: Outpatient Encounter Medications as of 03/07/2021  Medication Sig   Ascorbic Acid (VITAMIN C) 1000 MG tablet Take 1,000 mg by mouth daily.   aspirin EC 81 MG tablet Take 81 mg by mouth daily.   Blood Glucose Monitoring Suppl (ONE TOUCH ULTRA 2) w/Device KIT Use as instructed to check blood sugar daily as needed.  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   Blood Pressure Monitor KIT Disp 1 automated BP cuff.  Dx. [name]. Use daily as needed.   Cholecalciferol (VITAMIN D-3) 25 MCG (1000 UT) CAPS Take 2 capsules (2,000 Units total) by mouth daily.   docusate sodium (COLACE) 100 MG capsule Take 100 mg by mouth 2 (two) times daily.   esomeprazole (NEXIUM) 40 MG capsule TAKE 1 CAPSULE BY MOUTH DAILY AT 12 NOON   ezetimibe (ZETIA) 10 MG tablet TAKE 1 TABLET BY MOUTH ONCE DAILY   fluticasone (FLONASE) 50 MCG/ACT nasal spray PLACE 1 SPRAY INTO BOTH NOSTRILS TWICE DAILY   folic acid (FOLVITE) 627 MCG tablet Take 400 mcg by mouth daily.   glucose blood (ONE TOUCH ULTRA TEST) test strip Use as instructed to check blood sugar daily as needed.  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   Lancets (ONETOUCH ULTRASOFT) lancets Use as instructed to check blood sugar daily as needed.  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   levocetirizine (XYZAL) 5 MG tablet TAKE 1 TABLET BY MOUTH IN THE EVENING   lisinopril-hydrochlorothiazide (ZESTORETIC) 10-12.5 MG tablet Take 0.5 tablets by mouth daily.   metFORMIN (GLUCOPHAGE) 500 MG  tablet Take 2 tablets in the morning and in the evening.   Omega-3 Fatty Acids (FISH OIL PO) Take 2,400 mg by mouth daily.   potassium chloride (KLOR-CON) 10 MEQ tablet Take 1 tablet (10 mEq total) by mouth 2 (two) times daily.   pravastatin (PRAVACHOL) 20 MG tablet TAKE 1 TABLET BY MOUTH DAILY   tadalafil (CIALIS) 5 MG tablet Take 1 tablet (5 mg total) by mouth daily. For BPH   vitamin B-12 (CYANOCOBALAMIN) 1000 MCG tablet Take 1 tablet (1,000 mcg total) by mouth daily.   No facility-administered encounter medications on file as of 03/07/2021.   Thomas Todd was contacted to remind him of his upcoming telephone visit with [name] on 03/08/21 at 11:00. He was reminded to have all medications, supplements and any blood glucose and blood pressure readings available for review at appointment.   Are you having any problems with your medications? No  What concerns would you like to discuss with the pharmacist? No concerns. States everything is rolling right along.    Star Rating Drugs: Medication:   Last Fill: Day Supply Lisinopril-HCTZ 10-12.5 01/18/21  90 Pravastatin 20 mg  01/31/21 90 Metformin 500 mg  12/20/20  [place], CPP notified  Margaretmary Dys, [place] Pharmacy Assistant [phone]  I have reviewed the care management and care coordination activities outlined in this encounter and I am certifying that I agree with the content of this  note. No further action required.  [name], PharmD Clinical Pharmacist [facility] [phone]

## 2021-03-08 ENCOUNTER — Ambulatory Visit (INDEPENDENT_AMBULATORY_CARE_PROVIDER_SITE_OTHER): Payer: PPO

## 2021-03-08 ENCOUNTER — Other Ambulatory Visit: Payer: Self-pay

## 2021-03-08 DIAGNOSIS — I10 Essential (primary) hypertension: Secondary | ICD-10-CM

## 2021-03-08 DIAGNOSIS — E782 Mixed hyperlipidemia: Secondary | ICD-10-CM

## 2021-03-08 DIAGNOSIS — E119 Type 2 diabetes mellitus without complications: Secondary | ICD-10-CM | POA: Diagnosis not present

## 2021-03-08 NOTE — Progress Notes (Signed)
Chronic Care Management Pharmacy Note  03/08/21 Name:  Thomas Todd   MRN:  [id number] DOB:  May 24, 1951  Summary: Patient doing well on metformin, A1c 6.1%. LDL 100, back on pravastatin daily since April. Labs were done about 2 weeks later so expect LDL even lower now. BP slightly elevated around 140/90 in office. Patient is working on getting a new home BP cuff. He was unable to get it from his pharmacy with prescription.   Recommendations: If affordable, recommend an Omron brand arm cuff/automatic monitor. You can find these on [place] or from [place], [place], etc..   Plan: PCP follow up September 2022. CMA will call next month for BP log review.   Subjective: Thomas Todd is an 70 y.o. year old male who is a primary patient of Damita Dunnings, Elveria Rising, MD.  The CCM team was consulted for assistance with disease management and care coordination needs.    Engaged with patient by telephone for follow up visit in response to provider referral for pharmacy case management and/or care coordination services. Patient reports doing well, denies health concerns. After cardiology visit, he resumed his pravastatin daily.  Consent to Services:  The patient was given information about Chronic Care Management services, agreed to services, and gave verbal consent prior to initiation of services.  Please see initial visit note for detailed documentation.   Patient Care Team: Tonia Ghent, MD as PCP - General (Family Medicine) [name], [facility] as Pharmacist (Pharmacist)  Recent office visits: 01/18/21- PCP- Medicare Wellness- The following labs were ordered: CMP, lipid panel, A1C, PSA. A1c down from 7.5% to 6.1%. No changes to medication.    Recent consult visits:  12/29/2020  - Dr. Ida Rogue, Cardiology - EKG ordered, vasovagal syncope likely GI related. Recommend he resume daily pravastatin.   Hospital visits: 10/26/20 - ED visit- Episode of nausea and vomiting with abdominal  cramping, after which he became very lightheaded and passed out.  Symptoms sound consistent with vasovagal episode and patient had one similar episode in the past with unremarkable syncopal episode afterwards. Discharge home with PCP and cardiology follow up.   Objective:  Lab Results  Component Value Date   CREATININE 0.74 01/18/2021   BUN 11 01/18/2021   GFR 93.67 01/18/2021   GFRNONAA >60 10/26/2020   GFRAA 113 04/03/2008   NA 140 01/18/2021   K 4.3 01/18/2021   CALCIUM 10.0 01/18/2021   CO2 30 01/18/2021   GLUCOSE 105 (H) 01/18/2021    Lab Results  Component Value Date/Time   HGBA1C 6.1 01/18/2021 11:28 AM   HGBA1C 7.5 (H) 09/09/2020 08:28 AM   GFR 93.67 01/18/2021 11:28 AM   GFR 90.06 09/09/2020 08:28 AM   MICROALBUR 2.5 (H) 09/09/2010 08:27 AM   MICROALBUR 0.2 04/09/2009 08:39 AM    Last diabetic Eye exam:  Lab Results  Component Value Date/Time   HMDIABEYEEXA No Retinopathy 04/14/2019 12:00 AM    Last diabetic Foot exam:  01/18/21 PCP normal  Lab Results  Component Value Date   CHOL 178 01/18/2021   HDL 45.00 01/18/2021   LDLCALC 100 (H) 01/18/2021   LDLDIRECT 113.0 09/09/2020   TRIG 165.0 (H) 01/18/2021   CHOLHDL 4 01/18/2021    Hepatic Function Latest Ref Rng & Units 01/18/2021 09/09/2020 01/01/2020  Total Protein 6.0 - 8.3 g/dL 6.4 6.5 5.6(L)  Albumin 3.5 - 5.2 g/dL 4.6 4.5 4.2  AST 0 - 37 U/L 18 27 14   ALT 0 - 53 U/L 18 43 19  Alk Phosphatase 39 - 117 U/L 32(L) 37(L) 37(L)  Total Bilirubin 0.2 - 1.2 mg/dL 0.7 0.8 0.4  Bilirubin, Direct 0.0 - 0.3 mg/dL - - -    Lab Results  Component Value Date/Time   TSH 2.30 09/09/2020 08:28 AM   TSH 0.85 03/15/2018 09:01 AM    CBC Latest Ref Rng & Units 10/26/2020 09/09/2020 05/03/2020  WBC 4.0 - 10.5 K/uL 12.4(H) 8.1 8.2  Hemoglobin 13.0 - 17.0 g/dL 12.4(L) 13.8 12.8(L)  Hematocrit 39.0 - 52.0 % 36.5(L) 41.0 37.7(L)  Platelets 150 - 400 K/uL 205 198.0 183.0    Lab Results  Component Value Date/Time   VD25OH  36.93 09/09/2020 08:28 AM   VD25OH 28.71 (L) 05/03/2020 07:53 AM   Clinical ASCVD: No  The 10-year ASCVD risk score Mikey Bussing DC Jr., et al., 2013) is: 34%   Values used to calculate the score:     Age: 65 years     Sex: Male     Is Non-Hispanic African American: No     Diabetic: Yes     Tobacco smoker: No     Systolic Blood Pressure: 332 mmHg     Is BP treated: Yes     HDL Cholesterol: 45 mg/dL     Total Cholesterol: 178 mg/dL    Depression screen [facility] 2/9 01/12/2021 09/20/2020 01/01/2020  Decreased Interest 0 0 0  Down, Depressed, Hopeless 0 0 0  PHQ - 2 Score 0 0 0  Altered sleeping 0 - 0  Tired, decreased energy 0 - 0  Change in appetite 0 - 0  Feeling bad or failure about yourself  0 - 0  Trouble concentrating 0 - 0  Moving slowly or fidgety/restless 0 - 0  Suicidal thoughts 0 - 0  PHQ-9 Score 0 - 0  Difficult doing work/chores Not difficult at all - Not difficult at all    Social History   Tobacco Use  Smoking Status Former   Years: 40.00   Pack years: 0.00   Types: Cigarettes   Quit date: 09/17/2009   Years since quitting: 11.4  Smokeless Tobacco Never  Tobacco Comments   vapor   BP Readings from Last 3 Encounters:  01/18/21 140/80  01/12/21 (!) 149/78  12/29/20 140/80   Pulse Readings from Last 3 Encounters:  01/18/21 77  12/29/20 64  11/01/20 77   Wt Readings from Last 3 Encounters:  01/18/21 184 lb (83.5 kg)  01/12/21 182 lb (82.6 kg)  12/29/20 187 lb (84.8 kg)   BMI Readings from Last 3 Encounters:  01/18/21 27.17 kg/m  01/12/21 26.88 kg/m  12/29/20 27.62 kg/m    Assessment/Interventions: Review of patient past medical history, allergies, medications, health status, including review of consultants reports, laboratory and other test data, was performed as part of comprehensive evaluation and provision of chronic care management services.   SDOH:  (Social Determinants of Health) assessments and interventions performed: Yes SDOH Interventions     Flowsheet Row Most Recent Value  SDOH Interventions   Financial Strain Interventions Intervention Not Indicated      SDOH Screenings   Alcohol Screen: Low Risk    Last Alcohol Screening Score (AUDIT): 0  Depression (PHQ2-9): Low Risk    PHQ-2 Score: 0  Financial Resource Strain: Low Risk    Difficulty of Paying Living Expenses: Not very hard  Food Insecurity: No Food Insecurity   Worried About Running Out of Food in the Last Year: Never true   Ran Out of Food in the  Last Year: Never true  Housing: Low Risk    Last Housing Risk Score: 0  Physical Activity: Inactive   Days of Exercise per Week: 0 days   Minutes of Exercise per Session: 0 min  Social Connections: Not on file  Stress: No Stress Concern Present   Feeling of Stress : Not at all  Tobacco Use: Medium Risk   Smoking Tobacco Use: Former   Smokeless Tobacco Use: Never  Transportation Needs: No Transportation Needs   Lack of Transportation (Medical): No   Lack of Transportation (Non-Medical): No    CCM Care Plan  Allergies  Allergen Reactions   Atorvastatin     Myalgias    Medications Reviewed Today     Reviewed by [name], [facility] (Pharmacist) on 03/08/21 at 1113  Med List Status: <None>   Medication Order Taking? Sig Documenting Provider Last Dose Status Informant  Ascorbic Acid (VITAMIN C) 1000 MG tablet [id number] Yes Take 1,000 mg by mouth daily. [provider] Taking Active   aspirin EC 81 MG tablet [id number] Yes Take 81 mg by mouth daily. [provider] Taking Active   Blood Glucose Monitoring Suppl (ONE TOUCH ULTRA 2) w/Device KIT [id number] Yes Use as instructed to check blood sugar daily as needed.  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 Tonia Ghent, MD Taking Active   Blood Pressure Monitor KIT [id number] Yes Disp 1 automated BP cuff.  Dx. [name]. Use daily as needed. Tonia Ghent, MD Taking Active   Cholecalciferol (VITAMIN D-3) 25 MCG (1000 UT) CAPS [id number] Yes  Take 2 capsules (2,000 Units total) by mouth daily. Tonia Ghent, MD Taking Active   docusate sodium (COLACE) 100 MG capsule [id number] Yes Take 100 mg by mouth 2 (two) times daily. [provider] Taking Active   esomeprazole (NEXIUM) 40 MG capsule [id number] Yes TAKE 1 CAPSULE BY MOUTH DAILY AT 12 NOON Tonia Ghent, MD Taking Active   ezetimibe (ZETIA) 10 MG tablet [id number] Yes TAKE 1 TABLET BY MOUTH ONCE DAILY Tonia Ghent, MD Taking Active   fluticasone [facility]) 50 MCG/ACT nasal spray [id number] Yes PLACE 1 SPRAY INTO BOTH NOSTRILS TWICE DAILY Tonia Ghent, MD Taking Active   folic acid (FOLVITE) 935 MCG tablet [id number] Yes Take 400 mcg by mouth daily. [provider] Taking Active   glucose blood (ONE TOUCH ULTRA TEST) test strip [id number] Yes Use as instructed to check blood sugar daily as needed.  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 Tonia Ghent, MD Taking Active   Lancets [facility] ULTRASOFT) lancets [id number] Yes Use as instructed to check blood sugar daily as needed.  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 Tonia Ghent, MD Taking Active   levocetirizine Harlow Ohms) 5 MG tablet [id number] Yes TAKE 1 TABLET BY MOUTH IN THE Kalman Jewels, MD Taking Active   lisinopril-hydrochlorothiazide (ZESTORETIC) 10-12.5 MG tablet [id number] Yes Take 0.5 tablets by mouth daily. Tonia Ghent, MD Taking Active   metFORMIN (GLUCOPHAGE) 500 MG tablet [id number] Yes Take 2 tablets in the morning and in the evening. Tonia Ghent, MD Taking Active   Omega-3 Fatty Acids (FISH OIL PO) [id number] Yes Take 2,400 mg by mouth daily. [provider] Taking Active   potassium chloride (KLOR-CON) 10 MEQ tablet [id number] Yes Take 1 tablet (10 mEq total) by mouth 2 (two) times daily. Tonia Ghent, MD Taking Active   pravastatin (PRAVACHOL) 20 MG tablet [id number] Yes TAKE 1 TABLET BY MOUTH  DAILY Tonia Ghent, MD Taking Active Self  tadalafil (CIALIS)  5 MG tablet [id number] Yes Take 1 tablet (5 mg total) by mouth daily. For BPH Tonia Ghent, MD Taking Active   vitamin B-12 (CYANOCOBALAMIN) 1000 MCG tablet [id number] Yes Take 1 tablet (1,000 mcg total) by mouth daily. Tonia Ghent, MD Taking Active             Patient Active Problem List   Diagnosis Date Noted   History of colonic polyps    Colon cancer screening 05/16/2020   Healthcare maintenance 01/12/2020   Tinnitus 05/11/2019   Skin lesion 07/19/2018   Trigger finger 07/19/2018   Family history of heart disease 07/19/2018   Syncope 03/17/2018   Advance care planning 08/07/2014   Radicular pain 08/21/2012   Medicare welcome exam 02/13/2012   ANEMIA, VITAMIN B12 DEFICIENCY 07/15/2009   Vitamin D deficiency 04/14/2009   BACK PAIN 08/16/2007   TESTOSTERONE DEFICIENCY 03/04/2007   Hyperlipidemia 03/04/2007   ERECTILE DYSFUNCTION 03/04/2007   HYPERTENSION, BENIGN ESSENTIAL 03/04/2007   GERD 03/04/2007   Diabetes mellitus without complication ([place]) 56/38/9373   BPH (benign prostatic hyperplasia) 03/01/2007   Former smoker 03/01/2007    Immunization History  Administered Date(s) Administered   Fluad Quad(high Dose 65+) 11/01/2020   Influenza Split 07/26/2012   Influenza Whole 11/11/2009   Influenza,inj,Quad PF,6+ Mos 07/30/2013, 07/03/2014, 06/28/2015, 06/15/2016, 07/05/2017, 06/03/2018, 05/08/2019   PFIZER(Purple Top)SARS-COV-2 Vaccination 05/24/2020, 06/14/2020   Pneumococcal Conjugate-13 07/16/2018   Pneumococcal Polysaccharide-23 01/26/2015   Td 03/17/2002   Tdap 02/13/2012   Zoster Recombinat (Shingrix) 01/23/2018, 03/25/2018   Zoster, Live 09/03/2014    Conditions to be addressed/monitored:  Hypertension, Hyperlipidemia and Diabetes  Care Plan : [place]  Updates made by [name], [place] since 03/09/2021 12:00 AM     Problem: CHL AMB "PATIENT-SPECIFIC PROBLEM"      Long-Range Goal: Disease Management   Start Date: 12/14/2020   This Visit's Progress: On track  Priority: High  Note:   Current Barriers:  None identified   Pharmacist Clinical Goal(s):  Patient will contact provider office for questions/concerns as evidenced notation of same in electronic health record through collaboration with PharmD and provider.   Interventions: 1:1 collaboration with Tonia Ghent, MD regarding development and update of comprehensive plan of care as evidenced by provider attestation and co-signature Inter-disciplinary care team collaboration (see longitudinal plan of care) Comprehensive medication review performed; medication list updated in electronic medical record  Hypertension (BP goal <140/90) -Controlled - clinic readings within goal -per PCP his home monitor runs a little high, last OV 01/2021, they rechecked his BP and it came down. -Current treatment: Lisinopril/HCTZ 10/12.5 mg - 1/2 tablet daily  Potassium chloride 10 mEq - 1 tablet BID  -Medications previously tried: none  -Reports his home BP monitor was 20 mmHg high when compared to office check. Although usually his BP is < 130/80 at home. He was given a prescription for BP cuff and was unable to get this at his pharmacy. We discussed picking up an Omron brand if unable to get it covered by insurance.  -Current home readings:  Wrist or arm cuff: arm cuff   Caffeine intake:  He drinks 4 cups of coffee a day. Salt intake:  He likes salt but tries to limit by not adding to cooked food. Exercise: yard work, he is very active and stays busy around the house. None formal. -Denies hypotensive/hypertensive symptoms -Educated on Importance of home blood pressure  monitoring; -Counseled to monitor BP at home weekly, document, and provide log at future appointments -Recommended to continue current medication; Call if home BP consistently above 140/90.   Hyperlipidemia: (LDL goal < 100) -Controlled - LDL 100 (improved 01/2021) -Patient resumed pravastatin daily after  cardio visit 12/2020, labs were done about 2 weeks later so expect LDL even lower now. -Current treatment: Pravastatin 20 mg - 1 tablet daily Zetia 10 mg - 1 tablet daily  -Medications previously tried: atorvastatin - myalgia  -Educated on Cholesterol goals;  -Recommended to continue current medication  Diabetes (A1c goal <7%) -Controlled - A1c improved to 6.1% 01/2021 -Current medications: Metformin 500 mg - 2 tablets AM, 2 tablets in the evening -Medications previously tried: none  -Current home glucose readings - checking daily, states his BG was 118 this morning, fasting average low 100s, none above 120  post prandial glucose: none  -Denies hypoglycemic/hyperglycemic symptoms - denies any BG < 70 -Recommended to continue current medication  Patient Goals/Self-Care Activities Patient will:  - take medications as prescribed -check glucose daily, document, and provide at future appointments -check blood pressure weekly, document, and provide at future appointments  Follow Up Plan: PCP visit - September 2022, CCM visit in 12 months        Medication Assistance: None required.  Patient affirms current coverage meets needs.     Care Gaps: -Due for annual eye exam - pt reports this is scheduled for July 2022, he gets one every year  -Covid-19 Booster -PPSV23 dose 2 - pt plans to get in the fall with flu shot  Medication:                            Last Fill:         Day Supply Lisinopril-HCTZ 10-12.5          01/18/21              90 Pravastatin 20 mg                   01/31/21            90 Metformin 500 mg                   12/20/20              90  Patient's preferred pharmacy is:  [place], [place] - [address] [address] Phone: [phone] Fax: [phone]  Uses pill box? Yes Pt endorses 100% compliance  Care Plan and Follow Up Patient Decision:  Patient agrees to Care Plan and Follow-up.  [name],  PharmD Clinical Pharmacist [facility] at [facility] [phone]

## 2021-03-09 NOTE — Patient Instructions (Signed)
Dear Thomas Todd,  Below is a summary of the goals we discussed during our follow up appointment on March 08, 2021. Please contact me anytime with questions or concerns.   Visit Information  Patient Care Plan: CCM Pharmacy Care Plan     Problem Identified: CHL AMB "PATIENT-SPECIFIC PROBLEM"      Long-Range Goal: Disease Management   Start Date: 12/14/2020  This Visit's Progress: On track  Priority: High  Note:   Current Barriers:  None identified   Pharmacist Clinical Goal(s):  Patient will contact provider office for questions/concerns as evidenced notation of same in electronic health record through collaboration with PharmD and provider.   Interventions: 1:1 collaboration with Tonia Ghent, MD regarding development and update of comprehensive plan of care as evidenced by provider attestation and co-signature Inter-disciplinary care team collaboration (see longitudinal plan of care) Comprehensive medication review performed; medication list updated in electronic medical record  Hypertension (BP goal <140/90) -Controlled - clinic readings within goal -per PCP his home monitor runs a little high, last OV 01/2021, they rechecked his BP and it came down. -Current treatment: Lisinopril/HCTZ 10/12.5 mg - 1/2 tablet daily  Potassium chloride 10 mEq - 1 tablet BID  -Medications previously tried: none  -Reports his home BP monitor was 20 mmHg high when compared to office check. Although usually his BP is < 130/80 at home. He was given a prescription for BP cuff and was unable to get this at his pharmacy. We discussed picking up an Omron brand if unable to get it covered by insurance.  -Current home readings:  Wrist or arm cuff: arm cuff   Caffeine intake:  He drinks 4 cups of coffee a day. Salt intake:  He likes salt but tries to limit by not adding to cooked food. Exercise: yard work, he is very active and stays busy around the house. None formal. -Denies  hypotensive/hypertensive symptoms -Educated on Importance of home blood pressure monitoring; -Counseled to monitor BP at home weekly, document, and provide log at future appointments -Recommended to continue current medication; Call if home BP consistently above 140/90.   Hyperlipidemia: (LDL goal < 100) -Controlled - LDL 100 (improved 01/2021) -Patient resumed pravastatin daily after cardio visit 12/2020, labs were done about 2 weeks later so expect LDL even lower now. -Current treatment: Pravastatin 20 mg - 1 tablet daily Zetia 10 mg - 1 tablet daily  -Medications previously tried: atorvastatin - myalgia  -Educated on Cholesterol goals;  -Recommended to continue current medication  Diabetes (A1c goal <7%) -Controlled - A1c improved to 6.1% 01/2021 -Current medications: Metformin 500 mg - 2 tablets AM, 2 tablets in the evening -Medications previously tried: none  -Current home glucose readings - checking daily, states his BG was 118 this morning, fasting average low 100s, none above 120  post prandial glucose: none  -Denies hypoglycemic/hyperglycemic symptoms - denies any BG < 70 -Recommended to continue current medication  Patient Goals/Self-Care Activities Patient will:  - take medications as prescribed -check glucose daily, document, and provide at future appointments -check blood pressure weekly, document, and provide at future appointments  Follow Up Plan: PCP visit - September 2022, CCM visit in 12 months       The patient verbalized understanding of instructions, educational materials, and care plan provided today and declined offer to receive copy of patient instructions, educational materials, and care plan.    [name], PharmD Clinical Pharmacist [place] [facility] at [facility] [phone]

## 2021-03-16 ENCOUNTER — Other Ambulatory Visit: Payer: Self-pay | Admitting: Family Medicine

## 2021-04-27 ENCOUNTER — Other Ambulatory Visit: Payer: Self-pay | Admitting: Family Medicine

## 2021-04-29 ENCOUNTER — Other Ambulatory Visit: Payer: Self-pay | Admitting: Family Medicine

## 2021-04-29 DIAGNOSIS — E785 Hyperlipidemia, unspecified: Secondary | ICD-10-CM

## 2021-05-27 ENCOUNTER — Ambulatory Visit: Payer: PPO | Admitting: Family Medicine

## 2021-06-10 DIAGNOSIS — E119 Type 2 diabetes mellitus without complications: Secondary | ICD-10-CM | POA: Diagnosis not present

## 2021-06-10 LAB — HM DIABETES EYE EXAM

## 2021-06-29 ENCOUNTER — Telehealth: Payer: Self-pay

## 2021-06-29 NOTE — Progress Notes (Addendum)
Chronic Care Management Pharmacy Assistant   Name: Thomas Todd  MRN: [id number] DOB: 07/06/1951  Reason for Encounter: Hypertension Disease State   Recent office visits:  None since last CCM contact  Recent consult visits:  None since last CCM contact  Hospital visits:  None in previous 6 months  Medications: Outpatient Encounter Medications as of 06/29/2021  Medication Sig   Ascorbic Acid (VITAMIN C) 1000 MG tablet Take 1,000 mg by mouth daily.   aspirin EC 81 MG tablet Take 81 mg by mouth daily.   Blood Glucose Monitoring Suppl (ONE TOUCH ULTRA 2) w/Device KIT Use as instructed to check blood sugar daily as needed.  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   Blood Pressure Monitor KIT Disp 1 automated BP cuff.  Dx. [name]. Use daily as needed.   Cholecalciferol (VITAMIN D-3) 25 MCG (1000 UT) CAPS Take 2 capsules (2,000 Units total) by mouth daily.   docusate sodium (COLACE) 100 MG capsule Take 100 mg by mouth 2 (two) times daily.   esomeprazole (NEXIUM) 40 MG capsule TAKE 1 CAPSULE BY MOUTH DAILY AT 12 NOON   ezetimibe (ZETIA) 10 MG tablet TAKE 1 TABLET BY MOUTH ONCE DAILY   fluticasone (FLONASE) 50 MCG/ACT nasal spray PLACE 1 SPRAY INTO BOTH NOSTRILS TWICE DAILY   folic acid (FOLVITE) 741 MCG tablet Take 400 mcg by mouth daily.   glucose blood (ONE TOUCH ULTRA TEST) test strip Use as instructed to check blood sugar daily as needed.  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   Lancets (ONETOUCH ULTRASOFT) lancets Use as instructed to check blood sugar daily as needed.  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   levocetirizine (XYZAL) 5 MG tablet TAKE 1 TABLET BY MOUTH IN THE EVENING   lisinopril-hydrochlorothiazide (ZESTORETIC) 10-12.5 MG tablet Take 0.5 tablets by mouth daily.   metFORMIN (GLUCOPHAGE) 500 MG tablet Take 2 tablets in the morning and in the evening.   Omega-3 Fatty Acids (FISH OIL PO) Take 2,400 mg by mouth daily.   potassium chloride (KLOR-CON) 10 MEQ  tablet Take 1 tablet (10 mEq total) by mouth 2 (two) times daily.   pravastatin (PRAVACHOL) 20 MG tablet TAKE 1 TABLET BY MOUTH DAILY   tadalafil (CIALIS) 5 MG tablet TAKE 1 TABLET BY MOUTH ONCE A DAY FOR BPH   vitamin B-12 (CYANOCOBALAMIN) 1000 MCG tablet Take 1 tablet (1,000 mcg total) by mouth daily.   No facility-administered encounter medications on file as of 06/29/2021.    Recent Office Vitals: BP Readings from Last 3 Encounters:  01/18/21 140/80  01/12/21 (!) 149/78  12/29/20 140/80   Pulse Readings from Last 3 Encounters:  01/18/21 77  12/29/20 64  11/01/20 77    Wt Readings from Last 3 Encounters:  01/18/21 184 lb (83.5 kg)  01/12/21 182 lb (82.6 kg)  12/29/20 187 lb (84.8 kg)     Kidney Function Lab Results  Component Value Date/Time   CREATININE 0.74 01/18/2021 11:28 AM   CREATININE 0.76 10/26/2020 11:13 AM   GFR 93.67 01/18/2021 11:28 AM   GFRNONAA >60 10/26/2020 11:13 AM   GFRAA 113 04/03/2008 09:55 AM    BMP Latest Ref Rng & Units 01/18/2021 10/26/2020 09/09/2020  Glucose 70 - 99 mg/dL 105(H) 168(H) 147(H)  BUN 6 - 23 mg/dL 11 9 13   Creatinine 0.40 - 1.50 mg/dL 0.74 0.76 0.85  Sodium 135 - 145 mEq/L 140 139 140  Potassium 3.5 - 5.1 mEq/L 4.3 3.9 4.1  Chloride  96 - 112 mEq/L 102 102 102  CO2 19 - 32 mEq/L 30 25 31   Calcium 8.4 - 10.5 mg/dL 10.0 9.9 9.7    Contacted patient on 06/29/2021 to discuss hypertension disease state  Current antihypertensive regimen:  Lisinopril/HCTZ 10/12.5 mg - 1/2 tablet daily  Potassium chloride 10 mEq - 1 tablet BID   Patient verbally confirms he is taking the above medications as directed. Yes  How often are you checking your Blood Pressure? infrequently Patient did state he got a new blood pressure machine. He has only taken it one time a week or two ago (could not remember when). He will take it to Dr. Damita Dunnings when he gets back to [facility] to make sure it is calibrated correctly.   he checks his blood pressure at  nighttime after taking his medication.  Current home BP readings: Only reading patient had was 133/78 from a week or two ago.   Wrist or arm cuff: Arm Caffeine intake: Four cups of coffee a day Salt intake: Does not add salt to food OTC medications including pseudoephedrine or NSAIDs? Takes Ibuprofen for headache; 3 ibuprofen (200 mg) every morning because he wakes up sore.   Any readings above 180/120? No  What recent interventions/DTPs have been made by any provider to improve Blood Pressure control since last CPP Visit: Counseled to monitor BP at home weekly, document, and provide log at future appointments. Recommended to continue current medication; Call if home BP consistently above 140/90.   Any recent hospitalizations or ED visits since last visit with CPP? No  What diet changes have been made to improve Blood Pressure Control?  Patient has not had any diet changes.  What exercise is being done to improve your Blood Pressure Control?  Patient states he is very active in the yard. He cut down limbs yesterday. He is always moving; never sits down.   Adherence Review: Is the patient currently on ACE/ARB medication? Yes Does the patient have >5 day gap between last estimated fill dates? No  Star Rating Drugs:  Medication:   Last Fill: Day Supply Lisinopril-HCTZ 10-12.5         04/27/2021 90 Pravastatin 20 mg  04/29/2021 90 Metformin 500 mg  06/21/2021 90  Care Gaps: Annual wellness visit in last year? Yes 01/12/2021 Most Recent BP reading: 140/80 on 01/18/2021  If Diabetic: Most recent A1C reading: 6.1 on 01/18/2021 Last eye exam / retinopathy screening: 02/17/2020 Last diabetic foot exam: Up to date  No appointments scheduled within the next 30 days.  [name], CPP notified  [name], [place] Clinical Pharmacy Assistant [phone]  I have reviewed the care management and care coordination activities outlined in this encounter and I am certifying that I agree  with the content of this note. No further action required.  [name], PharmD Clinical Pharmacist [facility] at [facility] [phone]

## 2021-09-05 ENCOUNTER — Other Ambulatory Visit: Payer: Self-pay | Admitting: Family Medicine

## 2021-09-27 ENCOUNTER — Telehealth: Payer: Self-pay

## 2021-09-27 DIAGNOSIS — E119 Type 2 diabetes mellitus without complications: Secondary | ICD-10-CM

## 2021-09-27 NOTE — Chronic Care Management (AMB) (Addendum)
Chronic Care Management Pharmacy Assistant   Name: Thomas Todd  MRN: [id number] DOB: 1951/05/13  Reason for Encounter: Hypertension Review  Recent office visits:  None since last CCM contact  Recent consult visits:  06/10/21 - Optometry - No data found  Hospital visits:  None in previous 6 months  Medications: Outpatient Encounter Medications as of 09/27/2021  Medication Sig   Ascorbic Acid (VITAMIN C) 1000 MG tablet Take 1,000 mg by mouth daily.   aspirin EC 81 MG tablet Take 81 mg by mouth daily.   Blood Glucose Monitoring Suppl (ONE TOUCH ULTRA 2) w/Device KIT Use as instructed to check blood sugar daily as needed.  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   Blood Pressure Monitor KIT Disp 1 automated BP cuff.  Dx. [name]. Use daily as needed.   Cholecalciferol (VITAMIN D-3) 25 MCG (1000 UT) CAPS Take 2 capsules (2,000 Units total) by mouth daily.   docusate sodium (COLACE) 100 MG capsule Take 100 mg by mouth 2 (two) times daily.   esomeprazole (NEXIUM) 40 MG capsule TAKE 1 CAPSULE BY MOUTH DAILY AT 12 NOON   ezetimibe (ZETIA) 10 MG tablet TAKE 1 TABLET BY MOUTH ONCE DAILY   fluticasone (FLONASE) 50 MCG/ACT nasal spray PLACE 1 SPRAY INTO BOTH NOSTRILS TWICE DAILY   folic acid (FOLVITE) 662 MCG tablet Take 400 mcg by mouth daily.   glucose blood (ONE TOUCH ULTRA TEST) test strip Use as instructed to check blood sugar daily as needed.  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   Lancets (ONETOUCH ULTRASOFT) lancets Use as instructed to check blood sugar daily as needed.  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   levocetirizine (XYZAL) 5 MG tablet TAKE 1 TABLET BY MOUTH IN THE EVENING   lisinopril-hydrochlorothiazide (ZESTORETIC) 10-12.5 MG tablet Take 0.5 tablets by mouth daily.   metFORMIN (GLUCOPHAGE) 500 MG tablet Take 2 tablets in the morning and in the evening.   Omega-3 Fatty Acids (FISH OIL PO) Take 2,400 mg by mouth daily.   potassium chloride (KLOR-CON) 10 MEQ  tablet Take 1 tablet (10 mEq total) by mouth 2 (two) times daily.   pravastatin (PRAVACHOL) 20 MG tablet TAKE 1 TABLET BY MOUTH DAILY   tadalafil (CIALIS) 5 MG tablet TAKE 1 TABLET BY MOUTH ONCE A DAY FOR BPH   vitamin B-12 (CYANOCOBALAMIN) 1000 MCG tablet Take 1 tablet (1,000 mcg total) by mouth daily.   No facility-administered encounter medications on file as of 09/27/2021.    Recent Office Vitals: BP Readings from Last 3 Encounters:  01/18/21 140/80  01/12/21 (!) 149/78  12/29/20 140/80   Pulse Readings from Last 3 Encounters:  01/18/21 77  12/29/20 64  11/01/20 77    Wt Readings from Last 3 Encounters:  01/18/21 184 lb (83.5 kg)  01/12/21 182 lb (82.6 kg)  12/29/20 187 lb (84.8 kg)     Kidney Function Lab Results  Component Value Date/Time   CREATININE 0.74 01/18/2021 11:28 AM   CREATININE 0.76 10/26/2020 11:13 AM   GFR 93.67 01/18/2021 11:28 AM   GFRNONAA >60 10/26/2020 11:13 AM   GFRAA 113 04/03/2008 09:55 AM    BMP Latest Ref Rng & Units 01/18/2021 10/26/2020 09/09/2020  Glucose 70 - 99 mg/dL 105(H) 168(H) 147(H)  BUN 6 - 23 mg/dL 11 9 13   Creatinine 0.40 - 1.50 mg/dL 0.74 0.76 0.85  Sodium 135 - 145 mEq/L 140 139 140  Potassium 3.5 - 5.1 mEq/L 4.3 3.9 4.1  Chloride  96 - 112 mEq/L 102 102 102  CO2 19 - 32 mEq/L 30 25 31   Calcium 8.4 - 10.5 mg/dL 10.0 9.9 9.7     Contacted patient on 09/29/21 to discuss hypertension disease state  Current antihypertensive regimen:   Lisinopril/HCTZ 10/12.5 mg - 1/2 tablet daily  Potassium chloride 10 mEq - 1 tablet BID   Patient verbally confirms he is taking the above medications as directed. Yes  How often are you checking your Blood Pressure? daily  he checks his blood pressure in the morning before taking his medication.  Current home BP readings:   DATE:             BP               PULSE  09/29/21  136/77  -    138/82  - 09/27/21  140/81  -    139/78  -    The patient will take 2 or 3 BP's within  2-3 hours  to  make sure the monitor works   Wrist or arm cuff:  arm cuff  Caffeine intake: regular coffee in the morning  Salt intake:Will not add to foods  Over the counter medications including pseudoephedrine or NSAIDs? Ibuprofen  Any readings above 180/120? No   What recent interventions/DTPs have been made by any provider to improve Blood Pressure control since last CPP Visit: none identified  Any recent hospitalizations or ED visits since last visit with CPP? No  What diet changes have been made to improve Blood Pressure Control?    The patient will not add salt to foods . He is maintaining weight of 172 lbs  What exercise is being done to improve your Blood Pressure Control?   The patient  is constantly busy with working around the house and yard.   Adherence Review: Is the patient currently on ACE/ARB medication? Yes Does the patient have >5 day gap between last estimated fill dates? No   Star Rating Drugs:  Medication:   Last Fill: Day Supply Pravastatin 29m  07/29/21 90 Lisinopril/hctz 10-12.563m11/11/22 90 Metformin 50058m11/11/22 90     The patient asked about getting CGM due to fingers are getting sore.   Fasting BG 09/29/21  105   Care Gaps: Annual wellness visit in last year? Yes Most Recent BP reading:140/80  77-P  01/18/21  If Diabetic: Most recent A1C reading:6.1  01/18/21 Last eye exam / retinopathy screening:2021 Last diabetic foot exam:01/18/21  Upcoming appointments: No appointments scheduled within the next 30 days.   MicDebbora DusPP notified  VelAvel SensorCMGranjenosistant [phone] have reviewed the care management and care coordination activities outlined in this encounter and I am certifying that I agree with the content of this note. See addendum.  MicDebbora DusharmD Clinical Pharmacist [facility] at [phone]

## 2021-09-28 ENCOUNTER — Other Ambulatory Visit: Payer: Self-pay | Admitting: Family Medicine

## 2021-09-30 MED ORDER — FREESTYLE LIBRE 2 READER DEVI
1.0000 | 0 refills | Status: AC
Start: 2021-09-30 — End: ?

## 2021-09-30 MED ORDER — FREESTYLE LIBRE 2 SENSOR MISC: 1.0000 | 11 refills | Status: DC

## 2021-09-30 NOTE — Telephone Encounter (Signed)
Patient requested CGM to assist with glucose monitoring. I believe HTA now covers Freestyle [place] 2 through pharmacy for no copay. I have sent new prescription to his pharmacy.

## 2021-09-30 NOTE — Addendum Note (Signed)
Addended by: [name] on: 09/30/2021 10:45 AM   Modules accepted: Orders

## 2021-09-30 NOTE — Telephone Encounter (Addendum)
Spoke with the patient and informed of prescription for FreeStyle [place] 2 at [facility].  [name], CPP notified  Avel Sensor, [place] Assistant [phone]

## 2021-10-18 ENCOUNTER — Telehealth: Payer: Self-pay | Admitting: Family Medicine

## 2021-10-18 NOTE — Telephone Encounter (Addendum)
Spoke with patient. He reports feeling sluggish yesterday when BG dropped < 70 on Freestyle (compared with one touch and numbers were similar). He reduced his dose of metformin 1000 mg BID to 500 mg BID for the past 2 days.  He is keeping a close eye on sugars with Freestyle and verifying with One Touch. He states sometimes its more accurate than others. Reviewed accuracy with patient (within 10%) and lag time of 5-15 minutes. The One Touch is most accurate so double check with this if concerned. Discussed hypoglycemia precautions. He admits to not eating on regular schedule lately. He plans to schedule follow up with Dr. Damita Dunnings soon. Also notes his BP today is 129/76, HR 114. BP yesterday 133/80, 84. No additional follow up needed at this time.  [name], PharmD Clinical Pharmacist Practitioner [facility] at [facility] [phone]

## 2021-10-18 NOTE — Progress Notes (Signed)
Patient got a [employer] 2 and states his sugar is dropping. Patient has been comparing the [facility] numbers to his One Touch and the numbers are way off. One time his Freestyle said 45 and his One Touch stated 110. Another time the Freestyle stated 48 and his One Touch stated 112. Patient does not know which to believe. Patient has cut back on taking Metformin - patient is currently taking only 1000 mg vs. 2000 mg as prescribed. Patient would like [name] to call him back in regards to his numbers being different and what he should do.   The following numbers are taken from the [facility] 2  01/31  7:18 am 85  9:30 am 133  10:31 am 65  01/30  6:54 am 68  9:56 am 100  11:20 am 102  6:00 pm 87  7:28 pm 111  8:23 pm 148  01/29  3:37 am 129  9:21 am 119  2:31 pm 88  3:34 pm 118  8:20 pm 93  9:13 pm 96  11:52 pm [place], CPP notified  [name], [place] Assistant [phone]  Time Spent: 25 Minutes

## 2021-10-18 NOTE — Telephone Encounter (Signed)
Pt wife called asking if you could give pt a call to discuss his diabetes. Please advise.

## 2021-10-19 NOTE — Telephone Encounter (Signed)
Noted. Thanks.

## 2021-10-28 ENCOUNTER — Other Ambulatory Visit: Payer: Self-pay | Admitting: Family Medicine

## 2021-11-17 ENCOUNTER — Other Ambulatory Visit: Payer: Self-pay | Admitting: Family Medicine

## 2021-11-17 ENCOUNTER — Other Ambulatory Visit (INDEPENDENT_AMBULATORY_CARE_PROVIDER_SITE_OTHER): Payer: PPO

## 2021-11-17 ENCOUNTER — Other Ambulatory Visit: Payer: Self-pay

## 2021-11-17 DIAGNOSIS — D518 Other vitamin B12 deficiency anemias: Secondary | ICD-10-CM

## 2021-11-17 DIAGNOSIS — E119 Type 2 diabetes mellitus without complications: Secondary | ICD-10-CM | POA: Diagnosis not present

## 2021-11-17 DIAGNOSIS — E559 Vitamin D deficiency, unspecified: Secondary | ICD-10-CM

## 2021-11-17 DIAGNOSIS — Z125 Encounter for screening for malignant neoplasm of prostate: Secondary | ICD-10-CM | POA: Diagnosis not present

## 2021-11-17 LAB — LIPID PANEL
Cholesterol: 161 mg/dL (ref 0–200)
HDL: 54.8 mg/dL (ref >39.00)
LDL Cholesterol: 85 mg/dL (ref 0–99)
NonHDL: 106.12
Total CHOL/HDL Ratio: 3
Triglycerides: 108 mg/dL (ref 0.0–149.0)
VLDL: 21.6 mg/dL (ref 0.0–40.0)

## 2021-11-17 LAB — CBC WITH DIFFERENTIAL/PLATELET
Basophils Absolute: 0.1 10*3/uL (ref 0.0–0.1)
Basophils Relative: 0.7 % (ref 0.0–3.0)
Eosinophils Absolute: 0.2 10*3/uL (ref 0.0–0.7)
Eosinophils Relative: 2.1 % (ref 0.0–5.0)
HCT: 36.5 % — ABNORMAL LOW (ref 39.0–52.0)
Hemoglobin: 12.5 g/dL — ABNORMAL LOW (ref 13.0–17.0)
Lymphocytes Relative: 39.1 % (ref 12.0–46.0)
Lymphs Abs: 3.1 10*3/uL (ref 0.7–4.0)
MCHC: 34.1 g/dL (ref 30.0–36.0)
MCV: 84.8 fl (ref 78.0–100.0)
Monocytes Absolute: 0.7 10*3/uL (ref 0.1–1.0)
Monocytes Relative: 8.7 % (ref 3.0–12.0)
Neutro Abs: 3.9 10*3/uL (ref 1.4–7.7)
Neutrophils Relative %: 49.4 % (ref 43.0–77.0)
Platelets: 175 10*3/uL (ref 150.0–400.0)
RBC: 4.3 Mil/uL (ref 4.22–5.81)
RDW: 13.8 % (ref 11.5–15.5)
WBC: 8 10*3/uL (ref 4.0–10.5)

## 2021-11-17 LAB — COMPREHENSIVE METABOLIC PANEL
ALT: 12 U/L (ref 0–53)
AST: 15 U/L (ref 0–37)
Albumin: 4.4 g/dL (ref 3.5–5.2)
Alkaline Phosphatase: 32 U/L — ABNORMAL LOW (ref 39–117)
BUN: 9 mg/dL (ref 6–23)
CO2: 32 mEq/L (ref 19–32)
Calcium: 9.7 mg/dL (ref 8.4–10.5)
Chloride: 101 mEq/L (ref 96–112)
Creatinine, Ser: 0.77 mg/dL (ref 0.40–1.50)
GFR: 92.02 mL/min (ref >60.00)
Glucose, Bld: 102 mg/dL — ABNORMAL HIGH (ref 70–99)
Potassium: 3.4 mEq/L — ABNORMAL LOW (ref 3.5–5.1)
Sodium: 141 mEq/L (ref 135–145)
Total Bilirubin: 0.8 mg/dL (ref 0.2–1.2)
Total Protein: 6.3 g/dL (ref 6.0–8.3)

## 2021-11-17 LAB — PSA, MEDICARE: PSA: 0.69 ng/ml (ref 0.10–4.00)

## 2021-11-17 LAB — VITAMIN D 25 HYDROXY (VIT D DEFICIENCY, FRACTURES): VITD: 42.54 ng/mL (ref 30.00–100.00)

## 2021-11-17 LAB — VITAMIN B12: Vitamin B-12: 801 pg/mL (ref 211–911)

## 2021-11-17 LAB — HEMOGLOBIN A1C: Hgb A1c MFr Bld: 6.1 % (ref 4.6–6.5)

## 2021-11-24 ENCOUNTER — Other Ambulatory Visit: Payer: Self-pay

## 2021-11-24 ENCOUNTER — Encounter: Payer: Self-pay | Admitting: Family Medicine

## 2021-11-24 ENCOUNTER — Ambulatory Visit (INDEPENDENT_AMBULATORY_CARE_PROVIDER_SITE_OTHER): Payer: PPO | Admitting: Family Medicine

## 2021-11-24 VITALS — BP 180/78 | HR 109 | Temp 97.9°F | Ht 69.0 in | Wt 177.0 lb

## 2021-11-24 DIAGNOSIS — Z0000 Encounter for general adult medical examination without abnormal findings: Secondary | ICD-10-CM | POA: Diagnosis not present

## 2021-11-24 DIAGNOSIS — I10 Essential (primary) hypertension: Secondary | ICD-10-CM

## 2021-11-24 DIAGNOSIS — E119 Type 2 diabetes mellitus without complications: Secondary | ICD-10-CM

## 2021-11-24 DIAGNOSIS — M6530 Trigger finger, unspecified finger: Secondary | ICD-10-CM

## 2021-11-24 DIAGNOSIS — Z7189 Other specified counseling: Secondary | ICD-10-CM

## 2021-11-24 DIAGNOSIS — K6289 Other specified diseases of anus and rectum: Secondary | ICD-10-CM

## 2021-11-24 MED ORDER — METFORMIN HCL 500 MG PO TABS: ORAL_TABLET | ORAL | Status: DC

## 2021-11-24 NOTE — Progress Notes (Unsigned)
This visit occurred during the SARS-CoV-2 public health emergency.  Safety protocols were in place, including screening questions prior to the visit, additional usage of staff PPE, and extensive cleaning of exam room while observing appropriate contact time as indicated for disinfecting solutions.  I have personally reviewed the Medicare Annual Wellness questionnaire and have noted 1. The patient's medical and social history 2. Their use of alcohol, tobacco or illicit drugs 3. Their current medications and supplements 4. The patient's functional ability including ADL's, fall risks, home safety risks and hearing or visual             impairment. 5. Diet and physical activities 6. Evidence for depression or mood disorders  The patients weight, height, BMI have been recorded in the chart and visual acuity is per eye clinic.  I have made referrals, counseling and provided education to the patient based review of the above and I have provided the pt with a written personalized care plan for preventive services.  Provider list updated- see scanned forms.  Routine anticipatory guidance given to patient.  See health maintenance. The possibility exists that previously documented standard health maintenance information may have been brought forward from a previous encounter into this note.  If needed, that same information has been updated to reflect the current situation based on today's encounter.    Flu Shingles PNA Tetanus Colon  Breast cancer screening Prostate cancer screening Advance directive Cognitive function addressed- see scanned forms- and if abnormal then additional documentation follows.   In addition to [facility] Wellness, follow up visit for the below conditions:  DM2.  He was getting divergent readings between his GCM and fingerstick monitor.  D/w pt about pharmacy staff about that.  He doesn't have symptomatic hypoglycemia.   Sugar has been 90-120 on home checks.   No numbness  in the feet.    His wife is caring for her father, d/w pt.  She is living parttime at [employer] father in [place].     He had rectal itch with rectal bulging from presumed hemorrhoid.  Noted after BM.  No sx currently.    Hypertension:    Using medication without problems or lightheadedness:  yes Chest pain with exertion:no Edema:no Short of breath:no  Episodic rash in the groin.  Hydrocortisone didn't help.    He still has L 2nd finger triggering in spite of injection.  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  D/w pt about seeing Dr. Roland Rack.    Recheck BP 160/80 on our cuff.  BP elevated on his cuff concurrently.    PMH and SH reviewed  Meds, vitals, and allergies reviewed.   ROS: Per HPI.  Unless specifically indicated otherwise in HPI, the patient denies:  General: fever. Eyes: acute vision changes ENT: sore throat Cardiovascular: chest pain Respiratory: SOB GI: vomiting GU: dysuria Musculoskeletal: acute back pain Derm: acute rash Neuro: acute motor dysfunction Psych: worsening mood Endocrine: polydipsia Heme: bleeding Allergy: hayfever  GEN: nad, alert and oriented HEENT: ncat NECK: supple w/o LA CV: rrr. PULM: ctab, no inc wob ABD: soft, +bs EXT: no edema SKIN: no acute rash  Diabetic foot exam: Normal inspection No skin breakdown No calluses  Normal DP pulses Normal sensation to light touch and monofilament Nails normal

## 2021-11-24 NOTE — Patient Instructions (Addendum)
If you want me to set up the aorta ultrasound in [place] then let me know.   ?I would cut metformin back to 1 pill twice a day.  Plan on recheck in about 3-4 months with A1c at the visit.  ?Take care.  Glad to see you. ? ?Check your BP at home and let me know if persistently >140/>90. ?

## 2021-11-27 ENCOUNTER — Encounter: Payer: Self-pay | Admitting: Family Medicine

## 2021-11-29 ENCOUNTER — Encounter: Payer: Self-pay | Admitting: *Deleted

## 2021-12-01 ENCOUNTER — Telehealth: Payer: Self-pay

## 2021-12-01 NOTE — Assessment & Plan Note (Signed)
He can check his blood pressure at home and update me if persistently elevated.  Continue lisinopril hydrochlorothiazide for now. ?

## 2021-12-01 NOTE — Assessment & Plan Note (Signed)
Advance directive- wife designated if patient were incapacitated.  

## 2021-12-01 NOTE — Assessment & Plan Note (Addendum)
Flu previously done ?Shingles previously done ?PNA previously done ?Tetanus previously done ?COVID-vaccine previously done ?Colonoscopy 2021 ?Prostate cancer screening 2023 ?Advance directive-wife designated if patient were incapacitated. ?Cognitive function addressed- see scanned forms- and if abnormal then additional documentation follows.  ? ?We talked about AAA screening and he can let me know if he wants me to set that up.  See after visit summary. ?

## 2021-12-01 NOTE — Assessment & Plan Note (Signed)
He had some divergent readings on home monitoring but given his low A1c, I would cut metformin back to 1 pill twice a day.  Plan on recheck in about 3-4 months with A1c at the visit.  ?Continue work on diet and exercise.  He agrees to plan. ?

## 2021-12-01 NOTE — Chronic Care Management (AMB) (Signed)
Transition CCM to Self Care ? ?Patient contacted to inform they have achieved their CCM goals and no longer need to be contacted as frequently. Patient advised services will still be available to them if they would like to reach out or have any new health concerns. Verified patient had contact information to pharmacist and health concierge on hand. Patient made aware CCM services would be continued if desired. Patient consented to cancel future CCM appointments.  ? ?Charlene Brooke, CPP notified ? ?Markel Kurtenbach, CCMA ?Health concierge  ?[phone]  ?

## 2021-12-01 NOTE — Assessment & Plan Note (Signed)
Refer to orthopedics 

## 2021-12-22 ENCOUNTER — Other Ambulatory Visit: Payer: Self-pay | Admitting: Family Medicine

## 2021-12-22 DIAGNOSIS — J302 Other seasonal allergic rhinitis: Secondary | ICD-10-CM

## 2021-12-26 DIAGNOSIS — M65322 Trigger finger, left index finger: Secondary | ICD-10-CM | POA: Diagnosis not present

## 2021-12-30 ENCOUNTER — Other Ambulatory Visit: Payer: Self-pay

## 2021-12-30 ENCOUNTER — Other Ambulatory Visit
Admission: RE | Admit: 2021-12-30 | Discharge: 2021-12-30 | Disposition: A | Discharge status: Home / Self Care | Payer: PPO | Source: Ambulatory Visit | Attending: Surgery | Admitting: Surgery

## 2021-12-30 ENCOUNTER — Other Ambulatory Visit: Payer: Self-pay | Admitting: Surgery

## 2021-12-30 NOTE — Patient Instructions (Addendum)
Your procedure is scheduled on: 01/05/22 - Thursday ?Report to the Registration Desk on the 1st floor of the [facility]. ?To find out your arrival time, please call [phone] between 1PM - 3PM on: 01/04/22 - Wednesday ?Report to [place] at [facility] on 01/02/22, please call with your time. ? ?REMEMBER: ?Instructions that are not followed completely may result in serious medical risk, up to and including death; or upon the discretion of your surgeon and anesthesiologist your surgery may need to be rescheduled. ? ?Do not eat food after midnight the night before surgery.  ?No gum chewing, lozengers or hard candies. ? ?You may however, drink CLEAR liquids up to 2 hours before you are scheduled to arrive for your surgery. Do not drink anything within 2 hours of your scheduled arrival time. ? ?Clear liquids include: ?- water  ?Type 1 and Type 2 diabetics should only drink water. ? ?TAKE THESE MEDICATIONS THE MORNING OF SURGERY WITH A SIP OF WATER: ? ?- esomeprazole (NEXIUM) 40 MG capsule, (take one the night before and one on the morning of surgery - helps to prevent nausea after surgery.) ?- fluticasone (FLONASE) 50 MCG/ACT nasal spray ? ?- Stop taking metFORMIN (GLUCOPHAGE) 500 MG tablet beginning 01/03/22, you may resume taking the day after your surgery. ? ?Follow recommendations from Cardiologist, Pulmonologist or PCP regarding stopping Aspirin, Coumadin, Plavix, Eliquis, Pradaxa, or Pletal. Continue taking Aspirin but do not take the day of surgery. ? ?Stop taking One week prior to surgery: ?Stop Anti-inflammatories (NSAIDS) such as Advil, Aleve, Ibuprofen, Motrin, Naproxen, Naprosyn and Aspirin based products such as Excedrin, Goodys Powder, BC Powder. ? ?Stop beginning 12/30/21- ANY OVER THE COUNTER supplements until after surgery.Ascorbic Acid (VITAMIN C) 1000 MG tablet,Cholecalciferol (VITAMIN D), folic acid (FOLVITE) 315 MCG tablet, Omega-3 Fatty Acids . ? ?You may take  Tylenol if needed for pain up until the day of surgery. ? ?No Alcohol for 24 hours before or after surgery. ? ?No Smoking including e-cigarettes for 24 hours prior to surgery.  ?No chewable tobacco products for at least 6 hours prior to surgery.  ?No nicotine patches on the day of surgery. ? ?Do not use any "recreational" drugs for at least a week prior to your surgery.  ?Please be advised that the combination of cocaine and anesthesia may have negative outcomes, up to and including death. ?If you test positive for cocaine, your surgery will be cancelled. ? ?On the morning of surgery brush your teeth with toothpaste and water, you may rinse your mouth with mouthwash if you wish. ?Do not swallow any toothpaste or mouthwash. ? ?Use CHG Soap or wipes as directed on instruction sheet. ? ?Do not wear jewelry, make-up, hairpins, clips or nail polish. ? ?Do not wear lotions, powders, or perfumes.  ? ?Do not shave body from the neck down 48 hours prior to surgery just in case you cut yourself which could leave a site for infection.  ?Also, freshly shaved skin may become irritated if using the CHG soap. ? ?Contact lenses, hearing aids and dentures may not be worn into surgery. ? ?Do not bring valuables to the hospital. [facility] is not responsible for any missing/lost belongings or valuables.  ? ?Notify your doctor if there is any change in your medical condition (cold, fever, infection). ? ?Wear comfortable clothing (specific to your surgery type) to the hospital. ? ?After surgery, you can help prevent lung complications by doing breathing exercises.  ?Take deep breaths and  cough every 1-2 hours. Your doctor may order a device called an Incentive Spirometer to help you take deep breaths. ?When coughing or sneezing, hold a pillow firmly against your incision with both hands. This is called ?splinting.? Doing this helps protect your incision. It also decreases belly discomfort. ? ?If you are being admitted to the hospital  overnight, leave your suitcase in the car. ?After surgery it may be brought to your room. ? ?If you are being discharged the day of surgery, you will not be allowed to drive home. ?You will need a responsible adult (18 years or older) to drive you home and stay with you that night.  ? ?If you are taking public transportation, you will need to have a responsible adult (18 years or older) with you. ?Please confirm with your physician that it is acceptable to use public transportation.  ? ?Please call the [facility] at [phone] if you have any questions about these instructions. ? ?Surgery Visitation Policy: ? ?Patients undergoing a surgery or procedure may have two family members or support persons with them as long as the person is not COVID-19 positive or experiencing its symptoms.  ? ?Inpatient Visitation:   ? ?Visiting hours are 7 a.m. to 8 p.m. ?Up to four visitors are allowed at one time in a patient room, including children. The visitors may rotate out with other people during the day. One designated support person (adult) may remain overnight.  ?

## 2022-01-02 ENCOUNTER — Other Ambulatory Visit
Admission: RE | Admit: 2022-01-02 | Discharge: 2022-01-02 | Disposition: A | Discharge status: Home / Self Care | Payer: PPO | Source: Ambulatory Visit | Attending: Surgery | Admitting: Surgery

## 2022-01-02 DIAGNOSIS — Z01810 Encounter for preprocedural cardiovascular examination: Secondary | ICD-10-CM | POA: Diagnosis not present

## 2022-01-05 ENCOUNTER — Encounter: Payer: Self-pay | Admitting: Surgery

## 2022-01-05 ENCOUNTER — Ambulatory Visit
Admission: RE | Admit: 2022-01-05 | Discharge: 2022-01-05 | Disposition: A | Discharge status: Home / Self Care | Payer: PPO | Attending: Surgery | Admitting: Surgery

## 2022-01-05 ENCOUNTER — Other Ambulatory Visit: Payer: Self-pay

## 2022-01-05 ENCOUNTER — Ambulatory Visit: Payer: PPO | Admitting: Registered Nurse

## 2022-01-05 ENCOUNTER — Encounter
Admission: RE | Disposition: A | Discharge status: Home / Self Care | Payer: Self-pay | Source: Home / Self Care | Attending: Surgery

## 2022-01-05 DIAGNOSIS — M65322 Trigger finger, left index finger: Secondary | ICD-10-CM | POA: Insufficient documentation

## 2022-01-05 DIAGNOSIS — E119 Type 2 diabetes mellitus without complications: Secondary | ICD-10-CM | POA: Insufficient documentation

## 2022-01-05 DIAGNOSIS — Z87891 Personal history of nicotine dependence: Secondary | ICD-10-CM | POA: Diagnosis not present

## 2022-01-05 DIAGNOSIS — K219 Gastro-esophageal reflux disease without esophagitis: Secondary | ICD-10-CM | POA: Diagnosis not present

## 2022-01-05 DIAGNOSIS — E785 Hyperlipidemia, unspecified: Secondary | ICD-10-CM | POA: Diagnosis not present

## 2022-01-05 DIAGNOSIS — M79645 Pain in left finger(s): Secondary | ICD-10-CM | POA: Diagnosis present

## 2022-01-05 DIAGNOSIS — I10 Essential (primary) hypertension: Secondary | ICD-10-CM | POA: Diagnosis not present

## 2022-01-05 HISTORY — PX: TRIGGER FINGER RELEASE: SHX641

## 2022-01-05 LAB — GLUCOSE, CAPILLARY
Glucose-Capillary: 118 mg/dL — ABNORMAL HIGH (ref 70–99)
Glucose-Capillary: 129 mg/dL — ABNORMAL HIGH (ref 70–99)

## 2022-01-05 SURGERY — RELEASE, A1 PULLEY, FOR TRIGGER FINGER
Anesthesia: General | Site: Index Finger | Laterality: Left

## 2022-01-05 MED ORDER — KETOROLAC TROMETHAMINE 15 MG/ML IJ SOLN
INTRAMUSCULAR | Status: AC
  Administered 2022-01-05: 15 mg via INTRAVENOUS
  Filled 2022-01-05: qty 1

## 2022-01-05 MED ORDER — DEXAMETHASONE SODIUM PHOSPHATE 10 MG/ML IJ SOLN
INTRAMUSCULAR | Status: DC | PRN
  Administered 2022-01-05: 8 mg via INTRAVENOUS

## 2022-01-05 MED ORDER — SODIUM CHLORIDE 0.9 % IV SOLN: INTRAVENOUS | Status: DC

## 2022-01-05 MED ORDER — ONDANSETRON HCL 4 MG/2ML IJ SOLN
INTRAMUSCULAR | Status: DC | PRN
  Administered 2022-01-05: 4 mg via INTRAVENOUS

## 2022-01-05 MED ORDER — ACETAMINOPHEN 500 MG PO TABS: 1000.0000 mg | ORAL_TABLET | Freq: Four times a day (QID) | ORAL | Status: DC

## 2022-01-05 MED ORDER — CEFAZOLIN SODIUM-DEXTROSE 2-4 GM/100ML-% IV SOLN
2.0000 g | INTRAVENOUS | Status: AC
  Administered 2022-01-05: 2 g via INTRAVENOUS

## 2022-01-05 MED ORDER — FENTANYL CITRATE (PF) 100 MCG/2ML IJ SOLN
INTRAMUSCULAR | Status: AC
  Filled 2022-01-05: qty 2

## 2022-01-05 MED ORDER — OXYCODONE HCL 5 MG/5ML PO SOLN: 5.0000 mg | Freq: Once | ORAL | Status: DC | PRN

## 2022-01-05 MED ORDER — METOCLOPRAMIDE HCL 10 MG PO TABS: 5.0000 mg | ORAL_TABLET | Freq: Three times a day (TID) | ORAL | Status: DC | PRN

## 2022-01-05 MED ORDER — BUPIVACAINE HCL (PF) 0.5 % IJ SOLN
INTRAMUSCULAR | Status: AC
  Filled 2022-01-05: qty 30

## 2022-01-05 MED ORDER — CHLORHEXIDINE GLUCONATE 0.12 % MT SOLN
OROMUCOSAL | Status: AC
  Filled 2022-01-05: qty 15

## 2022-01-05 MED ORDER — ONDANSETRON HCL 4 MG/2ML IJ SOLN
INTRAMUSCULAR | Status: AC
  Filled 2022-01-05: qty 2

## 2022-01-05 MED ORDER — LIDOCAINE HCL (CARDIAC) PF 100 MG/5ML IV SOSY
PREFILLED_SYRINGE | INTRAVENOUS | Status: DC | PRN
Start: 2022-01-05 — End: 2022-01-05
  Administered 2022-01-05: 100 mg via INTRAVENOUS

## 2022-01-05 MED ORDER — ONDANSETRON HCL 4 MG PO TABS: 4.0000 mg | ORAL_TABLET | Freq: Four times a day (QID) | ORAL | Status: DC | PRN

## 2022-01-05 MED ORDER — MIDAZOLAM HCL 2 MG/2ML IJ SOLN
INTRAMUSCULAR | Status: DC | PRN
  Administered 2022-01-05: 1 mg via INTRAVENOUS

## 2022-01-05 MED ORDER — CHLORHEXIDINE GLUCONATE 0.12 % MT SOLN
15.0000 mL | Freq: Once | OROMUCOSAL | Status: AC
  Administered 2022-01-05: 15 mL via OROMUCOSAL

## 2022-01-05 MED ORDER — FENTANYL CITRATE (PF) 100 MCG/2ML IJ SOLN: 25.0000 ug | INTRAMUSCULAR | Status: DC | PRN

## 2022-01-05 MED ORDER — DEXAMETHASONE SODIUM PHOSPHATE 10 MG/ML IJ SOLN
INTRAMUSCULAR | Status: AC
  Filled 2022-01-05: qty 1

## 2022-01-05 MED ORDER — BUPIVACAINE HCL (PF) 0.5 % IJ SOLN
INTRAMUSCULAR | Status: DC | PRN
  Administered 2022-01-05: 10 mL

## 2022-01-05 MED ORDER — FENTANYL CITRATE (PF) 100 MCG/2ML IJ SOLN
INTRAMUSCULAR | Status: DC | PRN
  Administered 2022-01-05 (×2): 50 ug via INTRAVENOUS

## 2022-01-05 MED ORDER — OXYCODONE HCL 5 MG PO TABS: 5.0000 mg | ORAL_TABLET | Freq: Once | ORAL | Status: DC | PRN

## 2022-01-05 MED ORDER — METOCLOPRAMIDE HCL 5 MG/ML IJ SOLN: 5.0000 mg | Freq: Three times a day (TID) | INTRAMUSCULAR | Status: DC | PRN

## 2022-01-05 MED ORDER — SODIUM CHLORIDE 0.9 % IV SOLN
INTRAVENOUS | Status: DC
Start: 2022-01-05 — End: 2022-01-05

## 2022-01-05 MED ORDER — MIDAZOLAM HCL 2 MG/2ML IJ SOLN
INTRAMUSCULAR | Status: AC
  Filled 2022-01-05: qty 2

## 2022-01-05 MED ORDER — PROPOFOL 10 MG/ML IV BOLUS
INTRAVENOUS | Status: DC | PRN
  Administered 2022-01-05: 130 mg via INTRAVENOUS

## 2022-01-05 MED ORDER — ORAL CARE MOUTH RINSE: 15.0000 mL | Freq: Once | OROMUCOSAL | Status: AC

## 2022-01-05 MED ORDER — ONDANSETRON HCL 4 MG/2ML IJ SOLN: 4.0000 mg | Freq: Four times a day (QID) | INTRAMUSCULAR | Status: DC | PRN

## 2022-01-05 MED ORDER — CEFAZOLIN SODIUM-DEXTROSE 2-4 GM/100ML-% IV SOLN
INTRAVENOUS | Status: AC
  Filled 2022-01-05: qty 100

## 2022-01-05 MED ORDER — 0.9 % SODIUM CHLORIDE (POUR BTL) OPTIME
TOPICAL | Status: DC | PRN
  Administered 2022-01-05: 500 mL

## 2022-01-05 MED ORDER — LIDOCAINE HCL (PF) 2 % IJ SOLN
INTRAMUSCULAR | Status: AC
  Filled 2022-01-05: qty 5

## 2022-01-05 MED ORDER — KETOROLAC TROMETHAMINE 15 MG/ML IJ SOLN: 15.0000 mg | Freq: Once | INTRAMUSCULAR | Status: AC

## 2022-01-05 SURGICAL SUPPLY — 26 items
APL PRP STRL LF DISP 70% ISPRP (MISCELLANEOUS) ×1
BNDG CMPR STD VLCR NS LF 5.8X2 (GAUZE/BANDAGES/DRESSINGS) ×1
BNDG ELASTIC 2X5.8 VLCR NS LF (GAUZE/BANDAGES/DRESSINGS) ×2 IMPLANT
BNDG ESMARK 4X12 TAN STRL LF (GAUZE/BANDAGES/DRESSINGS) ×2 IMPLANT
CHLORAPREP W/TINT 26 (MISCELLANEOUS) ×2 IMPLANT
CORD BIP STRL DISP 12FT (MISCELLANEOUS) ×2 IMPLANT
CUFF TOURN SGL QUICK 18X4 (TOURNIQUET CUFF) ×1 IMPLANT
DRAPE SURG 17X11 SM STRL (DRAPES) ×4 IMPLANT
FORCEPS JEWEL BIP 4-3/4 STR (INSTRUMENTS) ×2 IMPLANT
GAUZE SPONGE 4X4 12PLY STRL (GAUZE/BANDAGES/DRESSINGS) ×2 IMPLANT
GAUZE XEROFORM 1X8 LF (GAUZE/BANDAGES/DRESSINGS) ×2 IMPLANT
GLOVE SURG ENC MOIS LTX SZ8 (GLOVE) ×4 IMPLANT
GLOVE SURG UNDER LTX SZ8 (GLOVE) ×2 IMPLANT
GOWN STRL REUS W/ TWL LRG LVL3 (GOWN DISPOSABLE) ×1 IMPLANT
GOWN STRL REUS W/ TWL XL LVL3 (GOWN DISPOSABLE) ×1 IMPLANT
GOWN STRL REUS W/TWL LRG LVL3 (GOWN DISPOSABLE) ×2
GOWN STRL REUS W/TWL XL LVL3 (GOWN DISPOSABLE) ×2
KIT TURNOVER KIT A (KITS) ×2 IMPLANT
MANIFOLD NEPTUNE II (INSTRUMENTS) ×2 IMPLANT
NDL HYPO 25X1 1.5 SAFETY (NEEDLE) ×1 IMPLANT
NEEDLE HYPO 25X1 1.5 SAFETY (NEEDLE) ×2 IMPLANT
NS IRRIG 500ML POUR BTL (IV SOLUTION) ×2 IMPLANT
PACK EXTREMITY ARMC (MISCELLANEOUS) ×2 IMPLANT
STOCKINETTE IMPERVIOUS 9X36 MD (GAUZE/BANDAGES/DRESSINGS) ×2 IMPLANT
SUT PROLENE 4 0 PS 2 18 (SUTURE) ×2 IMPLANT
WATER STERILE IRR 500ML POUR (IV SOLUTION) ×1 IMPLANT

## 2022-01-05 NOTE — Anesthesia Preprocedure Evaluation (Signed)
Anesthesia Evaluation  ?Patient identified by MRN, date of birth, ID band ?Patient awake ? ? ? ?Reviewed: ?Allergy & Precautions, NPO status , Patient's Chart, lab work & pertinent test results ? ?History of Anesthesia Complications ?Negative for: history of anesthetic complications ? ?Airway ?Mallampati: III ? ?TM Distance: >3 FB ?Neck ROM: full ? ? ? Dental ? ?(+) Chipped, Poor Dentition, Missing ?  ?Pulmonary ?neg shortness of breath, Patient abstained from smoking., former smoker,  ?  ?Pulmonary exam normal ? ? ? ? ? ? ? Cardiovascular ?Exercise Tolerance: Good ?hypertension, Normal cardiovascular exam ? ? ?  ?Neuro/Psych ?negative neurological ROS ? negative psych ROS  ? GI/Hepatic ?Neg liver ROS, GERD  Controlled,  ?Endo/Other  ?diabetes, Type 2 ? Renal/GU ?  ? ?  ?Musculoskeletal ? ?(+) Arthritis ,  ? Abdominal ?  ?Peds ? Hematology ?negative hematology ROS ?(+)   ?Anesthesia Other Findings ?Past Medical History: ?No date: Allergy ?No date: Anemia ?No date: Arthritis ?No date: Diabetes mellitus without complication ([place]) ?    Comment:  no medications taken 05-04-17 ?No date: Dyspnea ?No date: GERD (gastroesophageal reflux disease) ?No date: Hyperlipidemia ?No date: Hypertension ?No date: LBP (low back pain) ?    Comment:  with sciatica  ?No date: Pancreatitis ?    Comment:  Pancreatitis etoh 08/1998 ? ?Past Surgical History: ?No date: COLONOSCOPY ?06/07/2020: COLONOSCOPY WITH PROPOFOL; N/A ?    Comment:  Procedure: COLONOSCOPY WITH PROPOFOL;  Surgeon: Marius Ditch,  ?             Tally Due, MD;  Location: [facility] ENDOSCOPY;  Service:  ?             Gastroenterology;  Laterality: N/A; ?No date: dental implant ?1993: LAMINECTOMY ?    Comment:  L5/S1 ?1970: SEPTOPLASTY ? ? ? ? Reproductive/Obstetrics ?negative OB ROS ? ?  ? ? ? ? ? ? ? ? ? ? ? ? ? ?  ?  ? ? ? ? ? ? ? ? ?Anesthesia Physical ?Anesthesia Plan ? ?ASA: 3 ? ?Anesthesia Plan: General LMA  ? ?Post-op Pain Management:    ? ?Induction: Intravenous ? ?PONV Risk Score and Plan: Dexamethasone, Ondansetron, Midazolam and Treatment may vary due to age or medical condition ? ?Airway Management Planned: LMA ? ?Additional Equipment:  ? ?Intra-op Plan:  ? ?Post-operative Plan: Extubation in OR ? ?Informed Consent: I have reviewed the patients History and Physical, chart, labs and discussed the procedure including the risks, benefits and alternatives for the proposed anesthesia with the patient or authorized representative who has indicated his/her understanding and acceptance.  ? ? ? ?Dental Advisory Given ? ?Plan Discussed with: Anesthesiologist, CRNA and Surgeon ? ?Anesthesia Plan Comments: (Patient consented for risks of anesthesia including but not limited to:  ?- adverse reactions to medications ?- damage to eyes, teeth, lips or other oral mucosa ?- nerve damage due to positioning  ?- sore throat or hoarseness ?- Damage to heart, brain, nerves, lungs, other parts of body or loss of life ? ?Patient voiced understanding.)  ? ? ? ? ? ? ?Anesthesia Quick Evaluation ? ?

## 2022-01-05 NOTE — Op Note (Addendum)
01/05/2022 ? ?3:01 PM ? ?Patient:   Thomas Todd ? ?Pre-Op Diagnosis:   Left index trigger finger. ? ?Post-Op Diagnosis:   Same ? ?Procedure:   Release left index trigger finger. ? ?Surgeon:   Pascal Lux, MD ? ?Assistant:   Myra Rude, PA-S ? ?Anesthesia:   General LMA ? ?Findings:   As above. ? ?Complications:   None ? ?EBL:   0 cc ? ?Fluids:   800 cc crystalloid ? ?TT:   18 minutes at 250 mmHg ? ?Drains:   None ? ?Closure:   4-0 Prolene ? ?Implants:   None ? ?Brief Clinical Note:   The patient is a 71 year old male with a long history of painful catching of his left index finger. These symptoms have progressed despite medications, activity modification, etc. The patient's history and examination were consistent with a left index trigger finger. The patient presents at this time for a left index trigger finger release. ? ?Procedure:   The patient was brought into the operating room and lain in the supine position. After adequate general laryngeal mask anesthesia was achieved, the left hand and upper extremity were prepped with ChloraPrep solution before being draped sterilely. Preoperative antibiotics were administered. A timeout was performed to verify the appropriate surgical site before the limb was exsanguinated with an Esmarch and the tourniquet inflated to 250 mmHg ? ?An approximately 1.5-2.0 cm incision was made over the volar aspect of the left index finger at the level of the metacarpal head centered over the flexor sheath. The incision was carried down through the subcutaneous tissues with care taken to identify and protect the digital neurovascular structures. The flexor sheath was entered just proximal to the A1 pulley. The sheath was released proximally for several centimeters under direct visualization. Distally, a clamp was placed beneath the A1 pulley and used to release any adhesions. The clamp was repositioned so that one jaw was superficial to and the other jaw deep to the A1  pulley. The A1 pulley was incised on either side of the clamp to remove a 2 mm strip of tissue. Metzenbaum scissors were used to ensure complete release of the A1 pulley more distally. The underlying tendons were carefully inspected and found to be intact.  ? ?The wound was copiously irrigated with sterile saline solution before the wound was closed using 4-0 Prolene interrupted sutures. A total of 10 cc of 0.5% plain Sensorcaine was injected in and around the incision to help with postoperative analgesia before a sterile bulky dressing was applied to the hand. The patient was then awakened, extubated and returned to the recovery room in satisfactory condition after tolerating the procedure well. ?

## 2022-01-05 NOTE — Anesthesia Procedure Notes (Signed)
Procedure Name: LMA Insertion ?Date/Time: 01/05/2022 2:16 PM ?Performed by: Aline Brochure, CRNA ?Pre-anesthesia Checklist: Patient identified, Patient being monitored, Timeout performed, Emergency Drugs available and Suction available ?Patient Re-evaluated:Patient Re-evaluated prior to induction ?Oxygen Delivery Method: Circle system utilized ?Preoxygenation: Pre-oxygenation with 100% oxygen ?Induction Type: IV induction ?Ventilation: Mask ventilation without difficulty ?LMA: LMA inserted ?LMA Size: 4.5 ?Tube type: Oral ?Number of attempts: 1 ?Placement Confirmation: positive ETCO2 and breath sounds checked- equal and bilateral ?Tube secured with: Tape ?Dental Injury: Teeth and Oropharynx as per pre-operative assessment  ? ? ? ? ?

## 2022-01-05 NOTE — Discharge Instructions (Addendum)
Orthopedic discharge instructions: ?Keep dressing dry and intact. ?Keep hand elevated above heart level. ?May shower after dressing removed on postop day 4 (Monday). ?Cover sutures with Band-Aids after drying off. ?Apply ice to affected area frequently. ?Take ibuprofen 600-800 mg TID with meals for 5-7 days, then as necessary. ?Take ES Tylenol as prescribed when needed.  ?Return for follow-up in 10-14 days or as scheduled. ? ? ?AMBULATORY SURGERY  ?DISCHARGE INSTRUCTIONS ? ? ?The drugs that you were given will stay in your system until tomorrow so for the next 24 hours you should not: ? ?Drive an automobile ?Make any legal decisions ?Drink any alcoholic beverage ? ? ?You may resume regular meals tomorrow.  Today it is better to start with liquids and gradually work up to solid foods. ? ?You may eat anything you prefer, but it is better to start with liquids, then soup and crackers, and gradually work up to solid foods. ? ? ?Please notify your doctor immediately if you have any unusual bleeding, trouble breathing, redness and pain at the surgery site, drainage, fever, or pain not relieved by medication. ? ? ? ?Additional Instructions: ? ? ? ? ? ? ? ?Please contact your physician with any problems or Same Day Surgery at [phone], Monday through Friday 6 am to 4 pm, or [place] at [facility] number at [phone].  ?

## 2022-01-05 NOTE — Transfer of Care (Signed)
Immediate Anesthesia Transfer of Care Note ? ?Patient: Thomas Todd ? ?Procedure(s) Performed: RELEASE TRIGGER FINGER/A-1 PULLEY (Left: Index Finger) ? ?Patient Location: PACU ? ?Anesthesia Type:General ? ?Level of Consciousness: drowsy ? ?Airway & Oxygen Therapy: Patient Spontanous Breathing and Patient connected to face mask oxygen ? ?Post-op Assessment: Report given to RN and Post -op Vital signs reviewed and stable ? ?Post vital signs: Reviewed and stable ? ?Last Vitals:  ?Vitals Value Taken Time  ?BP 110/62 01/05/22 1455  ?Temp    ?Pulse 48 01/05/22 1456  ?Resp 9 01/05/22 1456  ?SpO2 100 % 01/05/22 1456  ?Vitals shown include unvalidated device data. ? ?Last Pain:  ?Vitals:  ? 01/05/22 1133  ?TempSrc: Oral  ?PainSc: 0-No pain  ?   ? ?  ? ?Complications: No notable events documented. ?

## 2022-01-05 NOTE — H&P (Signed)
History of Present Illness:  ?Thomas Todd is a 71 y.o. male who presents for evaluation and treatment of his recurrent painful catching of the left index finger. The patient notes that the symptoms have been present for several years but have been worsening over the past few months. He saw his primary care provider who gave him a steroid injection which provided little if any relief of the symptoms, prompting him to make this appointment. He notes that these symptoms are aggravated by repetitive use of the left hand and when he first awakens in the morning. He denies any specific injury to the finger, and denies any numbness or paresthesias to the fingertip. He likes to play the guitar and finds that these symptoms impair his ability to play the guitar. He is ready to discuss alternative treatment options. ? ?Current Outpatient Medications: ? cholecalciferol (VITAMIN D3) 1000 unit capsule Take by mouth  ? cyanocobalamin (VITAMIN B12) 1000 MCG tablet Take 1 tablet (1,000 mcg total) by mouth daily.  ? esomeprazole (NEXIUM) 40 MG DR capsule TAKE 1 CAPSULE BY MOUTH DAILY AT 12 NOON  ? ezetimibe (ZETIA) 10 mg tablet Take 1 tablet by mouth once daily  ? fluticasone propionate (FLONASE) 50 mcg/actuation nasal spray PLACE 1 SPRAY INTO BOTH NOSTRILS TWICE DAILY  ? levocetirizine (XYZAL) 5 MG tablet Take 1 tablet by mouth every evening  ? lisinopriL-hydroCHLOROthiazide (ZESTORETIC) 10-12.5 mg tablet Take 0.5 tablets by mouth daily.  ? metFORMIN (GLUCOPHAGE) 500 MG tablet TAKE 1 TABLET BY MOUTH IN THE MORNING AND IN THE EVENING  ? potassium chloride (KLOR-CON) 10 MEQ ER tablet Take 10 mEq by mouth 2 (two) times daily  ? pravastatin (PRAVACHOL) 20 MG tablet Take 1 tablet by mouth once daily  ? tadalafiL (CIALIS) 5 MG tablet TAKE 1 TABLET BY MOUTH ONCE A DAY FOR BPH  ? aspirin 81 MG EC tablet Take 81 mg by mouth once daily  ? docusate (COLACE) 100 MG capsule Take 100 mg by mouth 2 (two) times daily  ? folic acid (FOLVITE) 211  MCG tablet Take 400 mcg by mouth daily.  ? FREESTYLE LIBRE 2 READER reader  ? FREESTYLE LIBRE 2 SENSOR kit  ? ONETOUCH ULTRA TEST test strip  ? ?Past Medical History: ?No pertinent past medical history. ? ?Past Surgical History: ?No pertinent surgical history. ?  ?Past Family History:  ?No pertinent family history.  ? ?Social History:  ? ?Socioeconomic History:  ? Marital status: Married  ? ?Review of Systems:  ?A comprehensive 14 point ROS was performed, reviewed, and the pertinent orthopaedic findings are documented in the HPI. ? ?Physical Exam: ?Vitals:  ?12/26/21 1005 12/26/21 1006  ?BP: (!) 142/78  ?Weight: 80.7 kg (178 lb)  ?PainSc: 5 5  ?PainLoc: Finger Finger  ? ?General/Constitutional: The patient appears to be well-nourished, well-developed, and in no acute distress. ?Neuro/Psych: Normal mood and affect, oriented to person, place and time. ?Eyes: Non-icteric. Pupils are equal, round, and reactive to light, and exhibit synchronous movement. ?ENT: Unremarkable. ?Lymphatic: No palpable adenopathy. ?Respiratory: Lungs clear to auscultation, Normal chest excursion, No wheezes and Non-labored breathing ?Cardiovascular: Regular rate and rhythm. No murmurs. and No edema, swelling or tenderness, except as noted in detailed exam. ?Integumentary: No impressive skin lesions present, except as noted in detailed exam. ?Musculoskeletal: Unremarkable, except as noted in detailed exam. ? ?Left hand exam: ?Skin inspection of the left hand is unremarkable. No swelling, erythema, ecchymosis, abrasions, or other skin abnormalities are identified. He has no tenderness to  palpation over the dorsal or palmar aspects of the hand, nor along the index finger in particular. He exhibits full active and passive range of motion of the wrist without any pain or catching. He is able to flex and extend all digits with an obvious reproducible catching of the left index finger, consistent with a trigger finger. There is a fullness palpable  along the volar aspect of the index metacarpal head which appears to move with the tendon as it flexes and extends. He is neurovascular intact to all digits. ? ?Assessment: ? Trigger index finger of left hand. ? ?Plan: ?The treatment options were discussed with the patient and his wife. In addition, patient educational materials were provided regarding the diagnosis and treatment options. The patient is quite frustrated by his symptoms and functional limitations, and is ready to consider more aggressive treatment options. Therefore, I have recommended a surgical procedure, specifically a release of the left index trigger finger. The procedure was discussed with the patient, as were the potential risks (including bleeding, infection, nerve and/or blood vessel injury, persistent or recurrent pain/catching, stiffness of the finger, weakness of grip, need for further surgery, blood clots, strokes, heart attacks and/or arhythmias, pneumonia, etc.) and benefits. The patient states his understanding and wishes to proceed. All of the patient's questions and concerns were answered. He can call any time with further concerns. He will follow up post-surgery, routine. ? ? ?H&P reviewed and patient re-examined. No changes. ? ?

## 2022-01-05 NOTE — Anesthesia Postprocedure Evaluation (Signed)
Anesthesia Post Note ? ?Patient: Thomas Todd ? ?Procedure(s) Performed: RELEASE TRIGGER FINGER/A-1 PULLEY (Left: Index Finger) ? ?Patient location during evaluation: PACU ?Anesthesia Type: General ?Level of consciousness: awake and alert ?Pain management: pain level controlled ?Vital Signs Assessment: post-procedure vital signs reviewed and stable ?Respiratory status: spontaneous breathing, nonlabored ventilation, respiratory function stable and patient connected to nasal cannula oxygen ?Cardiovascular status: blood pressure returned to baseline and stable ?Postop Assessment: no apparent nausea or vomiting ?Anesthetic complications: no ? ? ?No notable events documented. ? ? ?Last Vitals:  ?Vitals:  ? 01/05/22 1538 01/05/22 1544  ?BP: (!) 179/92 (!) 176/90  ?Pulse: 66 72  ?Resp: 16 16  ?Temp: (!) 36.2 ?C 36.6 ?C  ?SpO2: 100% 96%  ?  ?Last Pain:  ?Vitals:  ? 01/05/22 1544  ?TempSrc: Oral  ?PainSc: 0-No pain  ? ? ?  ?  ?  ?  ?  ?  ? ?Thomas Todd ? ? ? ? ?

## 2022-01-06 ENCOUNTER — Encounter: Payer: Self-pay | Admitting: Surgery

## 2022-01-26 ENCOUNTER — Other Ambulatory Visit: Payer: Self-pay | Admitting: Family Medicine

## 2022-01-26 DIAGNOSIS — J302 Other seasonal allergic rhinitis: Secondary | ICD-10-CM

## 2022-01-26 DIAGNOSIS — E785 Hyperlipidemia, unspecified: Secondary | ICD-10-CM

## 2022-02-23 ENCOUNTER — Ambulatory Visit: Payer: PPO | Admitting: Gastroenterology

## 2022-02-23 ENCOUNTER — Encounter: Payer: Self-pay | Admitting: Gastroenterology

## 2022-02-23 VITALS — BP 172/89 | HR 87 | Temp 98.9°F | Ht 69.0 in | Wt 178.5 lb

## 2022-02-23 DIAGNOSIS — K642 Third degree hemorrhoids: Secondary | ICD-10-CM | POA: Diagnosis not present

## 2022-02-23 NOTE — Progress Notes (Signed)
PROCEDURE NOTE: The patient presents with symptomatic grade 3 hemorrhoids, unresponsive to maximal medical therapy, requesting rubber band ligation of his/her hemorrhoidal disease.  All risks, benefits and alternative forms of therapy were described and informed consent was obtained.  In the Left Lateral Decubitus position (if anoscopy is performed) anoscopic examination revealed grade 3 hemorrhoids in the all position(s).   The decision was made to band the RA internal hemorrhoid, and the Pena was used to perform band ligation without complication.  Digital anorectal examination was then performed to assure proper positioning of the band, and to adjust the banded tissue as required.  The patient was discharged home without pain or other issues.  Dietary and behavioral recommendations were given and (if necessary - prescriptions were given), along with follow-up instructions.  The patient will return 2 weeks for follow-up and possible additional banding as required.  No complications were encountered and the patient tolerated the procedure well.

## 2022-02-23 NOTE — Progress Notes (Signed)
Cephas Darby, MD [address]  [place]  [place], [place] [address]  Main: [phone]  Fax: [phone]    Gastroenterology Consultation  Referring Provider:     Tonia Ghent, MD Primary Care Physician:  Tonia Ghent, MD Primary Gastroenterologist:  Dr. Cephas Darby Reason for Consultation: Symptomatic hemorrhoids        HPI:   Thomas Todd is a 71 y.o. male referred by Dr. Tonia Ghent, MD  for consultation & management of symptomatic hemorrhoids.  Patient reports several years history of hemorrhoidal including rectal pressure, protrusion of tissue per rectum during strenuous physical activity or after a bowel movement, intense perianal itching. Patient is interested in hemorrhoid ligation.  Patient is accompanied by his wife today.  Otherwise, patient denies any other GI symptoms including constipation or incomplete emptying or rectal bleeding  NSAIDs: None  Antiplts/Anticoagulants/Anti thrombotics: None  GI Procedures:  Colonoscopy 06/07/2020 - One 5 mm polyp in the descending colon, removed with a cold snare. Resected and retrieved. - Four 4 to 7 mm polyps in the rectum, removed with a cold snare. Resected and retrieved. - Non-bleeding external hemorrhoids. DIAGNOSIS:  A.  COLON POLYP, DESCENDING; COLD SNARE:  - TUBULAR ADENOMA, 2 FRAGMENTS.  - NEGATIVE FOR HIGH-GRADE DYSPLASIA AND MALIGNANCY.   B.  RECTUM POLYP X 4, COLD SNARE:  - TUBULAR ADENOMA(S), 3 FRAGMENTS, NEGATIVE FOR HIGH-GRADE DYSPLASIA AND  MALIGNANCY.  - HYPERPLASTIC POLYP(S), MULTIPLE FRAGMENTS, NEGATIVE FOR DYSPLASIA AND  MALIGNANCY.   Past Medical History:  Diagnosis Date   Allergy    Anemia    Arthritis    Diabetes mellitus without complication (HCC)    no medications taken 05-04-17   Dyspnea    GERD (gastroesophageal reflux disease)    Hyperlipidemia    Hypertension    LBP (low back pain)    with sciatica    Pancreatitis    Pancreatitis etoh 08/1998     Past Surgical History:  Procedure Laterality Date   COLONOSCOPY     COLONOSCOPY WITH PROPOFOL N/A 06/07/2020   Procedure: COLONOSCOPY WITH PROPOFOL;  Surgeon: Lin Landsman, MD;  Location: [facility] ENDOSCOPY;  Service: Gastroenterology;  Laterality: N/A;   dental implant     LAMINECTOMY  1993   L5/S1   SEPTOPLASTY  1970   TRIGGER FINGER RELEASE Left 01/05/2022   Procedure: RELEASE TRIGGER FINGER/A-1 PULLEY;  Surgeon: Corky Mull, MD;  Location: [facility] ORS;  Service: Orthopedics;  Laterality: Left;    Current Outpatient Medications:    Ascorbic Acid (VITAMIN C) 1000 MG tablet, Take 1,000 mg by mouth daily., Disp: , Rfl:    aspirin EC 81 MG tablet, Take 81 mg by mouth daily., Disp: , Rfl:    Blood Glucose Monitoring Suppl (ONE TOUCH ULTRA 2) w/Device KIT, Use as instructed to check blood sugar daily as needed.  Diagnosis:  E11.9  Non insulin dependent., Disp: 1 each, Rfl: 0   Cholecalciferol (VITAMIN D-3) 25 MCG (1000 UT) CAPS, Take 2 capsules (2,000 Units total) by mouth daily., Disp: , Rfl:    Continuous Blood Gluc Receiver (FREESTYLE LIBRE 2 READER) DEVI, 1 Device by Does not apply route See admin instructions. Use reader for continues glucose monitoring, Disp: 1 each, Rfl: 0   Continuous Blood Gluc Sensor (FREESTYLE LIBRE 2 SENSOR) MISC, Inject 1 Device into the skin every 14 (fourteen) days., Disp: 2 each, Rfl: 11   docusate sodium (COLACE) 100 MG capsule, Take 100 mg by mouth  2 (two) times daily., Disp: , Rfl:    esomeprazole (NEXIUM) 40 MG capsule, TAKE 1 CAPSULE BY MOUTH DAILY AT 12 NOON, Disp: 90 capsule, Rfl: 1   ezetimibe (ZETIA) 10 MG tablet, TAKE 1 TABLET BY MOUTH ONCE DAILY, Disp: 90 tablet, Rfl: 2   fluticasone (FLONASE) 50 MCG/ACT nasal spray, PLACE 1 SPRAY INTO BOTH NOSTRILS TWICE DAILY, Disp: 48 g, Rfl: 3   folic acid (FOLVITE) 500 MCG tablet, Take 400 mcg by mouth daily., Disp: , Rfl:    ibuprofen (ADVIL) 200 MG tablet, Take 400 mg by mouth every 6 (six) hours as  needed., Disp: , Rfl:    Lancets (ONETOUCH ULTRASOFT) lancets, Use as instructed to check blood sugar daily as needed.  Diagnosis:  E11.9  Non insulin dependent., Disp: 100 each, Rfl: 3   levocetirizine (XYZAL) 5 MG tablet, TAKE 1 TABLET BY MOUTH IN THE EVENING, Disp: 90 tablet, Rfl: 3   lisinopril-hydrochlorothiazide (ZESTORETIC) 10-12.5 MG tablet, TAKE 1/2 A TABLET BY MOUTH DAILY., Disp: 45 tablet, Rfl: 3   metFORMIN (GLUCOPHAGE) 500 MG tablet, TAKE 1 TABLET BY MOUTH IN THE MORNING AND IN THE EVENING, Disp: , Rfl:    Omega-3 Fatty Acids (FISH OIL PO), Take 2,400 mg by mouth daily., Disp: , Rfl:    ONETOUCH ULTRA test strip, USE AS INSTRUCTED TO CHECK BLOOD SUGAR DAILY AS NEEDED. 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., Disp: 100 each, Rfl: 3   potassium chloride (KLOR-CON) 10 MEQ tablet, TAKE 1 TABLET BY MOUTH TWICE A DAY, Disp: 180 tablet, Rfl: 3   pravastatin (PRAVACHOL) 20 MG tablet, TAKE 1 TABLET BY MOUTH DAILY, Disp: 90 tablet, Rfl: 2   tadalafil (CIALIS) 5 MG tablet, TAKE 1 TABLET BY MOUTH ONCE A DAY FOR BPH, Disp: 30 tablet, Rfl: 5   vitamin B-12 (CYANOCOBALAMIN) 1000 MCG tablet, Take 1 tablet (1,000 mcg total) by mouth daily., Disp: , Rfl:   Family History  Problem Relation Age of Onset   Hypertension Mother    Diabetes Mother    Hyperlipidemia Mother    Hypothyroidism Mother    Dementia Mother    Alcohol abuse Father    Heart disease Father    Heart attack Father    Diabetes Sister    Colon cancer Neg Hx    Prostate cancer Neg Hx    Esophageal cancer Neg Hx    Stomach cancer Neg Hx    Rectal cancer Neg Hx      Social History   Tobacco Use   Smoking status: Former    Years: 40.00    Types: Cigarettes    Quit date: 09/17/2009    Years since quitting: 12.4   Smokeless tobacco: Never   Tobacco comments:    vapor  Vaping Use   Vaping Use: Every day   Substances: Nicotine, Flavoring   Devices: vaping only   Substance Use Topics   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   Physical Exam:  BP (!) 172/89 (BP Location: Left Arm, Patient Position: Sitting, Cuff Size: Normal)   Pulse 87   Temp 98.9 F (37.2 C) (Oral)   Ht 5' 9" (1.753 m)   Wt 178 lb 8 oz (81 kg)   BMI  26.36 kg/m  No LMP for male patient.  General:   Alert,  Well-developed, well-nourished, pleasant and cooperative in NAD Head:  Normocephalic and atraumatic. Eyes:  Sclera clear, no icterus.   Conjunctiva pink. Ears:  Normal auditory acuity. Nose:  No deformity, discharge, or lesions. Mouth:  No deformity or lesions,oropharynx pink & moist. Neck:  Supple; no masses or thyromegaly. Lungs:  Respirations even and unlabored.  Clear throughout to auscultation.   No wheezes, crackles, or rhonchi. No acute distress. Heart:  Regular rate and rhythm; no murmurs, clicks, rubs, or gallops. Abdomen:  Normal bowel sounds. Soft, non-tender and non-distended without masses, hepatosplenomegaly or hernias noted.  No guarding or rebound tenderness.   Rectal: Normal perianal skin, nontender digital rectal exam prolapsed external and internal hemorrhoids Msk:  Symmetrical without gross deformities. Good, equal movement & strength bilaterally. Pulses:  Normal pulses noted. Extremities:  No clubbing or edema.  No cyanosis. Neurologic:  Alert and oriented x3;  grossly normal neurologically. Skin:  Intact without significant lesions or rashes. No jaundice. Lymph Nodes:  No significant cervical adenopathy. Psych:  Alert and cooperative. Normal mood and affect.  Imaging Studies: No abdominal imaging  Assessment and Plan:   Thomas Todd is a 71 y.o. male with diabetes, hypertension, hyperlipidemia is seen in consultation for grade 3 external and internal hemorrhoids.  I  discussed about outpatient hemorrhoid ligation, the procedure and risks and benefits.  Patient is willing to undergo the procedure today, consent obtained Perform hemorrhoid ligation today   Follow up in 2 weeks   Cephas Darby, MD

## 2022-03-01 ENCOUNTER — Telehealth: Payer: PPO

## 2022-03-06 ENCOUNTER — Encounter: Payer: Self-pay | Admitting: Family Medicine

## 2022-03-06 ENCOUNTER — Ambulatory Visit (INDEPENDENT_AMBULATORY_CARE_PROVIDER_SITE_OTHER): Payer: PPO | Admitting: Family Medicine

## 2022-03-06 VITALS — BP 138/70 | HR 68 | Temp 97.9°F | Resp 16 | Ht 69.0 in | Wt 179.1 lb

## 2022-03-06 DIAGNOSIS — E119 Type 2 diabetes mellitus without complications: Secondary | ICD-10-CM | POA: Diagnosis not present

## 2022-03-06 LAB — POCT GLYCOSYLATED HEMOGLOBIN (HGB A1C): Hemoglobin A1C: 6.5 % — AB (ref 4.0–5.6)

## 2022-03-06 NOTE — Progress Notes (Unsigned)
Diabetes:  Using medications without difficulties: he is taking '500mg'$  metformin BID.   Hypoglycemic episodes: no Hyperglycemic episodes:no Feet problems: he has cold sensation but not numbness.   Blood Sugars averaging: 100-120s usually, higher right after meals but then it recovers quickly.   eye exam within last year: yes A1c done at [facility], d/w pt.    He has GI f/u for hemorrhoids.    Meds, vitals, and allergies reviewed.   ROS: Per HPI unless specifically indicated in ROS section   GEN: nad, alert and oriented HEENT: ncat NECK: supple w/o LA CV: rrr. PULM: ctab, no inc wob ABD: soft, +bs EXT: no edema SKIN: well perfused.    He had eval with [facility]- per patient they faxed the report but I don't see it currently.  I told him I would check with staff about this.

## 2022-03-06 NOTE — Patient Instructions (Addendum)
I would keep going as is.  Update me as needed.   It would be reasonable to recheck A1c at a visit in about 4-5 months.  Take care.  Glad to see you.

## 2022-03-08 ENCOUNTER — Telehealth: Payer: Self-pay | Admitting: Family Medicine

## 2022-03-08 NOTE — Assessment & Plan Note (Signed)
A1c is reasonable on lower dose of metformin.  Would continue as is.  See after visit summary.

## 2022-03-08 NOTE — Telephone Encounter (Signed)
Please check with [facility] about getting a copy of his eye exam.  Thanks.

## 2022-03-09 NOTE — Telephone Encounter (Signed)
Request for DM Eye Exam faxed to [place] at [phone].

## 2022-03-13 ENCOUNTER — Ambulatory Visit: Payer: PPO | Admitting: Gastroenterology

## 2022-03-13 ENCOUNTER — Encounter: Payer: Self-pay | Admitting: Gastroenterology

## 2022-03-13 VITALS — BP 169/85 | HR 102 | Temp 97.6°F | Ht 69.0 in | Wt 179.1 lb

## 2022-03-13 DIAGNOSIS — K642 Third degree hemorrhoids: Secondary | ICD-10-CM | POA: Diagnosis not present

## 2022-03-14 ENCOUNTER — Other Ambulatory Visit: Payer: Self-pay | Admitting: Family Medicine

## 2022-03-27 ENCOUNTER — Encounter: Payer: Self-pay | Admitting: Gastroenterology

## 2022-03-27 ENCOUNTER — Ambulatory Visit: Payer: PPO | Admitting: Gastroenterology

## 2022-03-27 VITALS — BP 172/81 | HR 93 | Temp 98.1°F | Ht 69.0 in | Wt 180.0 lb

## 2022-03-27 DIAGNOSIS — K642 Third degree hemorrhoids: Secondary | ICD-10-CM

## 2022-03-27 NOTE — Progress Notes (Signed)
PROCEDURE NOTE: The patient presents with symptomatic grade 3 hemorrhoids, unresponsive to maximal medical therapy, requesting rubber band ligation of his/her hemorrhoidal disease.  All risks, benefits and alternative forms of therapy were described and informed consent was obtained.  Patient reports his symptoms are improving  The decision was made to band the LL internal hemorrhoid, and the [place] was used to perform band ligation without complication.  Digital anorectal examination was then performed to assure proper positioning of the band, and to adjust the banded tissue as required.  The patient was discharged home without pain or other issues.  Dietary and behavioral recommendations were given and (if necessary - prescriptions were given), along with follow-up instructions.  The patient will return  as needed for follow-up and possible additional banding as required.  No complications were encountered and the patient tolerated the procedure well.

## 2022-04-11 ENCOUNTER — Other Ambulatory Visit: Payer: Self-pay | Admitting: Family Medicine

## 2022-04-13 ENCOUNTER — Telehealth: Payer: Self-pay

## 2022-04-13 NOTE — Progress Notes (Signed)
    Chronic Care Management Pharmacy Assistant   Name: Thomas Todd  MRN: [id number] DOB: 1951-03-03  Reason for Encounter: CCM (Blood Pressure Reading)   Called patient for a blood pressure reading.   Date Blood Pressure 07/25 125/75  Charlene Brooke, CPP notified  [name], [place] Clinical Pharmacy Assistant [phone]

## 2022-05-16 ENCOUNTER — Telehealth: Payer: Self-pay

## 2022-05-16 NOTE — Telephone Encounter (Signed)
Okay to overbook for another banding  RV

## 2022-05-16 NOTE — Telephone Encounter (Signed)
Patient sent a appointment request through [facility] requesting a appointment. He states he is still having hemorrhoids problems and would like to have another hemorrhoid banding. He has had 3 procedures already. Please advise if you want me to over book the patient or wait till January

## 2022-05-16 NOTE — Telephone Encounter (Signed)
Scheduled appointment and sent mychart messagew

## 2022-05-23 ENCOUNTER — Encounter: Payer: Self-pay | Admitting: Gastroenterology

## 2022-05-23 ENCOUNTER — Ambulatory Visit: Payer: PPO | Admitting: Gastroenterology

## 2022-05-23 VITALS — BP 174/77 | HR 85 | Temp 98.2°F | Ht 69.0 in | Wt 181.1 lb

## 2022-05-23 DIAGNOSIS — K642 Third degree hemorrhoids: Secondary | ICD-10-CM

## 2022-05-23 NOTE — Progress Notes (Signed)
Cephas Darby, MD [address]  [place]  [place], [place] [address]  Main: [phone]  Fax: [phone]    Gastroenterology Consultation  Referring Provider:     Tonia Ghent, MD Primary Care Physician:  Tonia Ghent, MD Primary Gastroenterologist:  Dr. Cephas Darby Reason for Consultation: Symptomatic hemorrhoids        HPI:   Thomas Todd is a 71 y.o. male referred by Dr. Tonia Ghent, MD  for consultation & management of symptomatic hemorrhoids.  Patient reports several years history of hemorrhoidal including rectal pressure, protrusion of tissue per rectum during strenuous physical activity or after a bowel movement, intense perianal itching. Patient is interested in hemorrhoid ligation.  Patient is accompanied by his wife today.  Otherwise, patient denies any other GI symptoms including constipation or incomplete emptying or rectal bleeding  Follow-up visit 05/23/2022 Patiently made an urgent visit for follow-up of hemorrhoids because he is still having hemorrhoidal problems and would like to have another hemorrhoid banding.  He underwent ligation of right anterior, right posterior and left lateral hemorrhoids in June and July 2023.  He notices prolapse of the hemorrhoids and he thinks the previous banding sessions have helped and interested in undergoing more hemorrhoid ligation instead of hemorrhoidectomy.  He notices tissue protruding on the left side and he has to push back in.  NSAIDs: None  Antiplts/Anticoagulants/Anti thrombotics: None  GI Procedures:  Colonoscopy 06/07/2020 - One 5 mm polyp in the descending colon, removed with a cold snare. Resected and retrieved. - Four 4 to 7 mm polyps in the rectum, removed with a cold snare. Resected and retrieved. - Non-bleeding external hemorrhoids. DIAGNOSIS:  A.  COLON POLYP, DESCENDING; COLD SNARE:  - TUBULAR ADENOMA, 2 FRAGMENTS.  - NEGATIVE FOR HIGH-GRADE DYSPLASIA AND MALIGNANCY.   B.   RECTUM POLYP X 4, COLD SNARE:  - TUBULAR ADENOMA(S), 3 FRAGMENTS, NEGATIVE FOR HIGH-GRADE DYSPLASIA AND  MALIGNANCY.  - HYPERPLASTIC POLYP(S), MULTIPLE FRAGMENTS, NEGATIVE FOR DYSPLASIA AND  MALIGNANCY.   Past Medical History:  Diagnosis Date   Allergy    Anemia    Arthritis    Diabetes mellitus without complication (HCC)    no medications taken 05-04-17   Dyspnea    GERD (gastroesophageal reflux disease)    Hyperlipidemia    Hypertension    LBP (low back pain)    with sciatica    Pancreatitis    Pancreatitis etoh 08/1998    Past Surgical History:  Procedure Laterality Date   COLONOSCOPY     COLONOSCOPY WITH PROPOFOL N/A 06/07/2020   Procedure: COLONOSCOPY WITH PROPOFOL;  Surgeon: Lin Landsman, MD;  Location: [facility] ENDOSCOPY;  Service: Gastroenterology;  Laterality: N/A;   dental implant     LAMINECTOMY  1993   L5/S1   SEPTOPLASTY  1970   TRIGGER FINGER RELEASE Left 01/05/2022   Procedure: RELEASE TRIGGER FINGER/A-1 PULLEY;  Surgeon: Corky Mull, MD;  Location: [facility] ORS;  Service: Orthopedics;  Laterality: Left;    Current Outpatient Medications:    Ascorbic Acid (VITAMIN C) 1000 MG tablet, Take 1,000 mg by mouth daily., Disp: , Rfl:    aspirin EC 81 MG tablet, Take 81 mg by mouth daily., Disp: , Rfl:    Blood Glucose Monitoring Suppl (ONE TOUCH ULTRA 2) w/Device KIT, Use as instructed to check blood sugar daily as needed.  Diagnosis:  E11.9  Non insulin dependent., Disp: 1 each, Rfl: 0   Cholecalciferol (VITAMIN D-3) 25  MCG (1000 UT) CAPS, Take 2 capsules (2,000 Units total) by mouth daily., Disp: , Rfl:    Continuous Blood Gluc Receiver (FREESTYLE LIBRE 2 READER) DEVI, 1 Device by Does not apply route See admin instructions. Use reader for continues glucose monitoring, Disp: 1 each, Rfl: 0   Continuous Blood Gluc Sensor (FREESTYLE LIBRE 2 SENSOR) MISC, Inject 1 Device into the skin every 14 (fourteen) days., Disp: 2 each, Rfl: 11   docusate sodium (COLACE) 100 MG  capsule, Take 100 mg by mouth 2 (two) times daily., Disp: , Rfl:    esomeprazole (NEXIUM) 40 MG capsule, TAKE 1 CAPSULE BY MOUTH DAILY AT 12 NOON, Disp: 90 capsule, Rfl: 1   ezetimibe (ZETIA) 10 MG tablet, TAKE 1 TABLET BY MOUTH ONCE DAILY, Disp: 90 tablet, Rfl: 2   fluticasone (FLONASE) 50 MCG/ACT nasal spray, PLACE 1 SPRAY INTO BOTH NOSTRILS TWICE DAILY, Disp: 48 g, Rfl: 3   folic acid (FOLVITE) 267 MCG tablet, Take 400 mcg by mouth daily., Disp: , Rfl:    ibuprofen (ADVIL) 200 MG tablet, Take 400 mg by mouth every 6 (six) hours as needed., Disp: , Rfl:    Lancets (ONETOUCH ULTRASOFT) lancets, Use as instructed to check blood sugar daily as needed.  Diagnosis:  E11.9  Non insulin dependent., Disp: 100 each, Rfl: 3   levocetirizine (XYZAL) 5 MG tablet, TAKE 1 TABLET BY MOUTH IN THE EVENING, Disp: 90 tablet, Rfl: 3   lisinopril-hydrochlorothiazide (ZESTORETIC) 10-12.5 MG tablet, TAKE 1/2 A TABLET BY MOUTH DAILY., Disp: 45 tablet, Rfl: 3   metFORMIN (GLUCOPHAGE) 500 MG tablet, TAKE 1 TABLET BY MOUTH IN THE MORNING AND IN THE EVENING, Disp: , Rfl:    Omega-3 Fatty Acids (FISH OIL PO), Take 2,400 mg by mouth daily., Disp: , Rfl:    ONETOUCH ULTRA test strip, USE AS INSTRUCTED TO CHECK BLOOD SUGAR DAILY AS NEEDED. 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., Disp: 100 each, Rfl: 3   potassium chloride (KLOR-CON) 10 MEQ tablet, TAKE 1 TABLET BY MOUTH TWICE A DAY, Disp: 180 tablet, Rfl: 3   pravastatin (PRAVACHOL) 20 MG tablet, TAKE 1 TABLET BY MOUTH DAILY, Disp: 90 tablet, Rfl: 2   tadalafil (CIALIS) 5 MG tablet, TAKE 1 TABLET BY MOUTH ONCE A DAY FOR BPH, Disp: 30 tablet, Rfl: 5   vitamin B-12 (CYANOCOBALAMIN) 1000 MCG tablet, Take 1 tablet (1,000 mcg total) by mouth daily., Disp: , Rfl:   Family History  Problem Relation Age of Onset   Hypertension Mother    Diabetes Mother    Hyperlipidemia Mother    Hypothyroidism Mother    Dementia Mother    Alcohol abuse Father    Heart disease Father    Heart  attack Father    Diabetes Sister    Colon cancer Neg Hx    Prostate cancer Neg Hx    Esophageal cancer Neg Hx    Stomach cancer Neg Hx    Rectal cancer Neg Hx      Social History   Tobacco Use   Smoking status: Former    Years: 40.00    Types: Cigarettes    Quit date: 09/17/2009    Years since quitting: 12.6   Smokeless tobacco: Never   Tobacco comments:    vapor  Vaping Use   Vaping Use: Every day   Substances: Nicotine, Flavoring   Devices: vaping only   Substance Use Topics   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   Physical Exam:  BP (!) 174/77 (BP Location: Left Arm, Patient Position: Sitting, Cuff Size: Normal)   Pulse 85   Temp 98.2 F (36.8 C) (Oral)   Ht 5' 9"  (1.753 m)   Wt 181 lb 2 oz (82.2 kg)   BMI 26.75 kg/m  No LMP for male patient.  General:   Alert,  Well-developed, well-nourished, pleasant and cooperative in NAD Head:  Normocephalic and atraumatic. Eyes:  Sclera clear, no icterus.   Conjunctiva pink. Ears:  Normal auditory acuity. Nose:  No deformity, discharge, or lesions. Mouth:  No deformity or lesions,oropharynx pink & moist. Neck:  Supple; no masses or thyromegaly. Lungs:  Respirations even and unlabored.  Clear throughout to auscultation.   No wheezes, crackles, or rhonchi. No acute distress. Heart:  Regular rate and rhythm; no murmurs, clicks, rubs, or gallops. Abdomen:  Normal bowel sounds. Soft, non-tender and non-distended without masses, hepatosplenomegaly or hernias noted.  No guarding or rebound tenderness.   Rectal: Normal perianal skin, nontender digital rectal exam prolapsed external and internal hemorrhoids Msk:  Symmetrical without gross deformities. Good, equal movement & strength bilaterally. Pulses:   Normal pulses noted. Extremities:  No clubbing or edema.  No cyanosis. Neurologic:  Alert and oriented x3;  grossly normal neurologically. Skin:  Intact without significant lesions or rashes. No jaundice. Lymph Nodes:  No significant cervical adenopathy. Psych:  Alert and cooperative. Normal mood and affect.  Imaging Studies: No abdominal imaging  Assessment and Plan:   Thomas Todd is a 71 y.o. male with diabetes, hypertension, hyperlipidemia is seen in consultation for grade 3 external and internal hemorrhoids this was ligation of RA, RP and left lateral hemorrhoids.  I discussed about outpatient hemorrhoid ligation, the procedure and risks and benefits.  Patient is willing to undergo the procedure today, consent obtained Perform hemorrhoid ligation of LL today   Follow up as needed   Cephas Darby, MD

## 2022-05-23 NOTE — Progress Notes (Signed)
PROCEDURE NOTE: The patient presents with symptomatic grade 3 hemorrhoids, unresponsive to maximal medical therapy, requesting rubber band ligation of his/her hemorrhoidal disease.  All risks, benefits and alternative forms of therapy were described and informed consent was obtained.  Patient reports his symptoms are improving  The decision was made to band the LL internal hemorrhoid, and the [place] was used to perform band ligation without complication.  Digital anorectal examination was then performed to assure proper positioning of the band, and to adjust the banded tissue as required.  The patient was discharged home without pain or other issues.  Dietary and behavioral recommendations were given and (if necessary - prescriptions were given), along with follow-up instructions.  The patient will return  as needed for follow-up and possible additional banding as required.  No complications were encountered and the patient tolerated the procedure well.

## 2022-06-13 LAB — HM DIABETES EYE EXAM

## 2022-06-27 ENCOUNTER — Telehealth: Payer: Self-pay

## 2022-06-27 NOTE — Telephone Encounter (Signed)
Go ahead and schedule another banding visit.  His symptoms were improving on banding and he does not prefer to undergo hemorrhoidectomy  RV

## 2022-06-27 NOTE — Telephone Encounter (Signed)
Patient sent a mychart message that said Comments: I need another band precedure done.  Called patient on both numbers and left a message on primary number called mobile number and voicemail is not set up. Need to find out more information why he needed another banding because he has had 4 already.

## 2022-06-27 NOTE — Telephone Encounter (Signed)
Patient states his hemorrhoid is still trying to come out. Denies any blood in stool.

## 2022-06-27 NOTE — Telephone Encounter (Signed)
Made appointment for 06/28/2022 at 8:15

## 2022-06-28 ENCOUNTER — Encounter: Payer: Self-pay | Admitting: Gastroenterology

## 2022-06-28 ENCOUNTER — Ambulatory Visit (INDEPENDENT_AMBULATORY_CARE_PROVIDER_SITE_OTHER): Payer: PPO | Admitting: Gastroenterology

## 2022-06-28 VITALS — BP 163/79 | HR 83 | Temp 98.1°F | Ht 69.0 in | Wt 180.2 lb

## 2022-06-28 DIAGNOSIS — K642 Third degree hemorrhoids: Secondary | ICD-10-CM | POA: Diagnosis not present

## 2022-06-28 NOTE — Progress Notes (Signed)
PROCEDURE NOTE: The patient presents with symptomatic grade 3 hemorrhoids, unresponsive to maximal medical therapy, requesting rubber band ligation of his/her hemorrhoidal disease.  All risks, benefits and alternative forms of therapy were described and informed consent was obtained.  Patient reports his symptoms are improving  The decision was made to band the LL internal hemorrhoid, and the [place] was used to perform band ligation without complication.  Digital anorectal examination was then performed to assure proper positioning of the band, and to adjust the banded tissue as required.  The patient was discharged home without pain or other issues.  Dietary and behavioral recommendations were given and (if necessary - prescriptions were given), along with follow-up instructions.  The patient will return  as needed for follow-up and possible additional banding as required.  No complications were encountered and the patient tolerated the procedure well.

## 2022-06-28 NOTE — Patient Instructions (Signed)
Take one cup full of Metamucil daily with a large cup of water.  Kegel Exercises  Kegel exercises can help strengthen your pelvic floor muscles. The pelvic floor is a group of muscles that support your rectum, small intestine, and bladder. In females, pelvic floor muscles also help support the uterus. These muscles help you control the flow of urine and stool (feces). Kegel exercises are painless and simple. They do not require any equipment. Your provider may suggest Kegel exercises to: Improve bladder and bowel control. Improve sexual response. Improve weak pelvic floor muscles after surgery to remove the uterus (hysterectomy) or after pregnancy, in females. Improve weak pelvic floor muscles after prostate gland removal or surgery, in males. Kegel exercises involve squeezing your pelvic floor muscles. These are the same muscles you squeeze when you try to stop the flow of urine or keep from passing gas. The exercises can be done while sitting, standing, or lying down, but it is best to vary your position. Ask your health care provider which exercises are safe for you. Do exercises exactly as told by your health care provider and adjust them as directed. Do not begin these exercises until told by your health care provider. Exercises How to do Kegel exercises: Squeeze your pelvic floor muscles tight. You should feel a tight lift in your rectal area. If you are a male, you should also feel a tightness in your vaginal area. Keep your stomach, buttocks, and legs relaxed. Hold the muscles tight for up to 10 seconds. Breathe normally. Relax your muscles for up to 10 seconds. Repeat as told by your health care provider. Repeat this exercise daily as told by your health care provider. Continue to do this exercise for at least 4-6 weeks, or for as long as told by your health care provider. You may be referred to a physical therapist who can help you learn more about how to do Kegel exercises. Depending  on your condition, your health care provider may recommend: Varying how long you squeeze your muscles. Doing several sets of exercises every day. Doing exercises for several weeks. Making Kegel exercises a part of your regular exercise routine. This information is not intended to replace advice given to you by your health care provider. Make sure you discuss any questions you have with your health care provider. Document Revised: 01/13/2021 Document Reviewed: 01/13/2021 Elsevier Patient Education  [place].

## 2022-06-28 NOTE — Progress Notes (Signed)
Thomas Darby, MD [address]  [place]  [place], [place] [address]  Main: [phone]  Fax: [phone]    Gastroenterology Consultation  Referring Provider:     Tonia Ghent, MD Primary Care Physician:  Tonia Ghent, MD Primary Gastroenterologist:  Dr. Cephas Todd Reason for Consultation: Symptomatic hemorrhoids        HPI:   Thomas Todd is a 71 y.o. male referred by Dr. Tonia Ghent, MD  for consultation & management of symptomatic hemorrhoids.  Patient reports several years history of hemorrhoidal including rectal pressure, protrusion of tissue per rectum during strenuous physical activity or after a bowel movement, intense perianal itching. Patient is interested in hemorrhoid ligation.  Patient is accompanied by his wife today.  Otherwise, patient denies any other GI symptoms including constipation or incomplete emptying or rectal bleeding  Follow-up visit 05/23/2022 Patiently made an urgent visit for follow-up of hemorrhoids because he is still having hemorrhoidal problems and would like to have another hemorrhoid banding.  He underwent ligation of right anterior, right posterior and left lateral hemorrhoids in June and July 2023.  He notices prolapse of the hemorrhoids and he thinks the previous banding sessions have helped and interested in undergoing more hemorrhoid ligation instead of hemorrhoidectomy.  He notices tissue protruding on the left side and he has to push back in.  Follow-up visit 06/28/2022 Patient called our office yesterday to schedule hemorrhoid banding because he has noticed protrusion of his left lateral hemorrhoid.  Lately, he has not been drinking enough water, has been eating more 7-Up and stools have been a little harder associated with straining.  He definitely thinks banding has helped significantly with regards to prolapse of the hemorrhoids.  He reports more than 50% improvement in the amount of tissue protruding out.  He  is interested in another banding today  NSAIDs: None  Antiplts/Anticoagulants/Anti thrombotics: None  GI Procedures:  Colonoscopy 06/07/2020 - One 5 mm polyp in the descending colon, removed with a cold snare. Resected and retrieved. - Four 4 to 7 mm polyps in the rectum, removed with a cold snare. Resected and retrieved. - Non-bleeding external hemorrhoids. DIAGNOSIS:  A.  COLON POLYP, DESCENDING; COLD SNARE:  - TUBULAR ADENOMA, 2 FRAGMENTS.  - NEGATIVE FOR HIGH-GRADE DYSPLASIA AND MALIGNANCY.   B.  RECTUM POLYP X 4, COLD SNARE:  - TUBULAR ADENOMA(S), 3 FRAGMENTS, NEGATIVE FOR HIGH-GRADE DYSPLASIA AND  MALIGNANCY.  - HYPERPLASTIC POLYP(S), MULTIPLE FRAGMENTS, NEGATIVE FOR DYSPLASIA AND  MALIGNANCY.   Past Medical History:  Diagnosis Date   Allergy    Anemia    Arthritis    Diabetes mellitus without complication (HCC)    no medications taken 05-04-17   Dyspnea    GERD (gastroesophageal reflux disease)    Hyperlipidemia    Hypertension    LBP (low back pain)    with sciatica    Pancreatitis    Pancreatitis etoh 08/1998    Past Surgical History:  Procedure Laterality Date   COLONOSCOPY     COLONOSCOPY WITH PROPOFOL N/A 06/07/2020   Procedure: COLONOSCOPY WITH PROPOFOL;  Surgeon: Lin Landsman, MD;  Location: [facility] ENDOSCOPY;  Service: Gastroenterology;  Laterality: N/A;   dental implant     LAMINECTOMY  1993   L5/S1   SEPTOPLASTY  1970   TRIGGER FINGER RELEASE Left 01/05/2022   Procedure: RELEASE TRIGGER FINGER/A-1 PULLEY;  Surgeon: Corky Mull, MD;  Location: [facility] ORS;  Service: Orthopedics;  Laterality: Left;  Current Outpatient Medications:    Ascorbic Acid (VITAMIN C) 1000 MG tablet, Take 1,000 mg by mouth daily., Disp: , Rfl:    aspirin EC 81 MG tablet, Take 81 mg by mouth daily., Disp: , Rfl:    Blood Glucose Monitoring Suppl (ONE TOUCH ULTRA 2) w/Device KIT, Use as instructed to check blood sugar daily as needed.  Diagnosis:  E11.9  Non insulin  dependent., Disp: 1 each, Rfl: 0   Cholecalciferol (VITAMIN D-3) 25 MCG (1000 UT) CAPS, Take 2 capsules (2,000 Units total) by mouth daily., Disp: , Rfl:    Continuous Blood Gluc Receiver (FREESTYLE LIBRE 2 READER) DEVI, 1 Device by Does not apply route See admin instructions. Use reader for continues glucose monitoring, Disp: 1 each, Rfl: 0   Continuous Blood Gluc Sensor (FREESTYLE LIBRE 2 SENSOR) MISC, Inject 1 Device into the skin every 14 (fourteen) days., Disp: 2 each, Rfl: 11   docusate sodium (COLACE) 100 MG capsule, Take 100 mg by mouth 2 (two) times daily., Disp: , Rfl:    esomeprazole (NEXIUM) 40 MG capsule, TAKE 1 CAPSULE BY MOUTH DAILY AT 12 NOON, Disp: 90 capsule, Rfl: 1   ezetimibe (ZETIA) 10 MG tablet, TAKE 1 TABLET BY MOUTH ONCE DAILY, Disp: 90 tablet, Rfl: 2   fluticasone (FLONASE) 50 MCG/ACT nasal spray, PLACE 1 SPRAY INTO BOTH NOSTRILS TWICE DAILY, Disp: 48 g, Rfl: 3   folic acid (FOLVITE) 423 MCG tablet, Take 400 mcg by mouth daily., Disp: , Rfl:    ibuprofen (ADVIL) 200 MG tablet, Take 400 mg by mouth every 6 (six) hours as needed., Disp: , Rfl:    Lancets (ONETOUCH ULTRASOFT) lancets, Use as instructed to check blood sugar daily as needed.  Diagnosis:  E11.9  Non insulin dependent., Disp: 100 each, Rfl: 3   levocetirizine (XYZAL) 5 MG tablet, TAKE 1 TABLET BY MOUTH IN THE EVENING, Disp: 90 tablet, Rfl: 3   lisinopril-hydrochlorothiazide (ZESTORETIC) 10-12.5 MG tablet, TAKE 1/2 A TABLET BY MOUTH DAILY., Disp: 45 tablet, Rfl: 3   metFORMIN (GLUCOPHAGE) 500 MG tablet, TAKE 1 TABLET BY MOUTH IN THE MORNING AND IN THE EVENING, Disp: , Rfl:    Omega-3 Fatty Acids (FISH OIL PO), Take 2,400 mg by mouth daily., Disp: , Rfl:    ONETOUCH ULTRA test strip, USE AS INSTRUCTED TO CHECK BLOOD SUGAR DAILY AS NEEDED. 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., Disp: 100 each, Rfl: 3   potassium chloride (KLOR-CON) 10 MEQ tablet, TAKE 1 TABLET BY MOUTH TWICE A DAY, Disp: 180 tablet, Rfl: 3    pravastatin (PRAVACHOL) 20 MG tablet, TAKE 1 TABLET BY MOUTH DAILY, Disp: 90 tablet, Rfl: 2   tadalafil (CIALIS) 5 MG tablet, TAKE 1 TABLET BY MOUTH ONCE A DAY FOR BPH, Disp: 30 tablet, Rfl: 5   vitamin B-12 (CYANOCOBALAMIN) 1000 MCG tablet, Take 1 tablet (1,000 mcg total) by mouth daily., Disp: , Rfl:   Family History  Problem Relation Age of Onset   Hypertension Mother    Diabetes Mother    Hyperlipidemia Mother    Hypothyroidism Mother    Dementia Mother    Alcohol abuse Father    Heart disease Father    Heart attack Father    Diabetes Sister    Colon cancer Neg Hx    Prostate cancer Neg Hx    Esophageal cancer Neg Hx    Stomach cancer Neg Hx    Rectal cancer Neg Hx      Social History   Tobacco Use  Smoking status: Former    Years: 40.00    Types: Cigarettes    Quit date: 09/17/2009    Years since quitting: 12.7   Smokeless tobacco: Never   Tobacco comments:    vapor  Vaping Use   Vaping Use: Every day   Substances: Nicotine, Flavoring   Devices: vaping only   Substance Use Topics   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   Physical Exam:  BP (!) 165/78 (BP Location: Left Arm, Patient Position: Sitting, Cuff Size: Normal)   Pulse 71   Temp 98.1 F (36.7 C) (Oral)   Ht _0  (1.753 m)   Wt 180 lb 4 oz (81.8 kg)   BMI 26.62 kg/m  No LMP for male patient.  General:   Alert,  Well-developed, well-nourished, pleasant and cooperative in NAD Head:  Normocephalic and atraumatic. Eyes:  Sclera clear, no icterus.   Conjunctiva pink. Ears:  Normal auditory acuity. Nose:  No deformity, discharge, or lesions. Mouth:  No deformity or lesions,oropharynx pink & moist. Neck:  Supple; no masses or thyromegaly. Lungs:  Respirations even and  unlabored.  Clear throughout to auscultation.   No wheezes, crackles, or rhonchi. No acute distress. Heart:  Regular rate and rhythm; no murmurs, clicks, rubs, or gallops. Abdomen:  Normal bowel sounds. Soft, non-tender and non-distended without masses, hepatosplenomegaly or hernias noted.  No guarding or rebound tenderness.   Rectal: Normal perianal skin, nontender digital rectal exam, slightly prolapsed left lateral external hemorrhoid Msk:  Symmetrical without gross deformities. Good, equal movement & strength bilaterally. Pulses:  Normal pulses noted. Extremities:  No clubbing or edema.  No cyanosis. Neurologic:  Alert and oriented x3;  grossly normal neurologically. Skin:  Intact without significant lesions or rashes. No jaundice. Psych:  Alert and cooperative. Normal mood and affect.  Imaging Studies: No abdominal imaging  Assessment and Plan:   Shone Leventhal is a 71 y.o. male with diabetes, hypertension, hyperlipidemia is seen in consultation for grade 3 external and internal hemorrhoids this was ligation of RA, RP and left lateral hemorrhoids.  I discussed about outpatient hemorrhoid ligation, the procedure and risks and benefits.  Patient is willing to undergo the procedure today, consent obtained Perform hemorrhoid ligation of LL today   Follow up as needed   Thomas Darby, MD

## 2022-07-12 ENCOUNTER — Other Ambulatory Visit: Payer: Self-pay | Admitting: Family Medicine

## 2022-08-07 ENCOUNTER — Ambulatory Visit (INDEPENDENT_AMBULATORY_CARE_PROVIDER_SITE_OTHER): Payer: PPO | Admitting: Family Medicine

## 2022-08-07 ENCOUNTER — Encounter: Payer: Self-pay | Admitting: Family Medicine

## 2022-08-07 VITALS — BP 150/78 | HR 66 | Temp 98.1°F | Ht 69.0 in | Wt 180.0 lb

## 2022-08-07 DIAGNOSIS — E119 Type 2 diabetes mellitus without complications: Secondary | ICD-10-CM

## 2022-08-07 DIAGNOSIS — Z23 Encounter for immunization: Secondary | ICD-10-CM

## 2022-08-07 LAB — POCT GLYCOSYLATED HEMOGLOBIN (HGB A1C): Hemoglobin A1C: 6.1 % — AB (ref 4.0–5.6)

## 2022-08-07 MED ORDER — METFORMIN HCL 500 MG PO TABS: ORAL_TABLET | ORAL | Status: DC

## 2022-08-07 NOTE — Progress Notes (Signed)
Diabetes:  Using medications without difficulties: yes Hypoglycemic episodes:not unless prolonged fasting, cautions d/w pt.   Hyperglycemic episodes: rare elevations on CGM but it was lower on cap blood glucose.    Feet problems: no Blood Sugars averaging: ~120.   eye exam within last year: done this year [facility].   A1c d/w pt at OV.  6.1.   Flu shot today.    He had hemorrhoid banding per GI.    He had L 2nd finger surgery.  He has improved ROM in the meantime.  He is going to try playing guitar again.  He has full extension now.    Meds, vitals, and allergies reviewed.   ROS: Per HPI unless specifically indicated in ROS section   GEN: nad, alert and oriented HEENT: ncat NECK: supple w/o LA CV: rrr. PULM: ctab, no inc wob ABD: soft, +bs EXT: no edema SKIN: no acute rash

## 2022-08-07 NOTE — Assessment & Plan Note (Signed)
Recheck at a yearly visit in about 4-5 months.   Reasonable to cut back on metformin with 1 tab in the AM.  If still having low sugars then let me know.   Continue work on diet and exercise.  He agrees with plan.

## 2022-08-07 NOTE — Patient Instructions (Addendum)
Recheck at a yearly visit in about 4-5 months.   Take care.  Glad to see you. Reasonable to cut back on metformin for 1 tab in the AM.  If you still have low sugars then let me know.

## 2022-09-05 ENCOUNTER — Other Ambulatory Visit: Payer: Self-pay | Admitting: Family Medicine

## 2022-09-12 ENCOUNTER — Other Ambulatory Visit: Payer: Self-pay | Admitting: Family Medicine

## 2022-09-12 DIAGNOSIS — E119 Type 2 diabetes mellitus without complications: Secondary | ICD-10-CM

## 2022-09-14 IMAGING — CT CT MAXILLOFACIAL W/O CM
3 series · 15 of 47 positions shown, 18 images · non-contrast
Comparison: None.

CLINICAL DATA: Neck trauma (Age >= 65y); Head trauma, minor (Age >=
65y)

EXAM:
CT HEAD WITHOUT CONTRAST
CT MAXILLOFACIAL WITHOUT CONTRAST
CT CERVICAL SPINE WITHOUT CONTRAST
TECHNIQUE: Multidetector CT imaging of the head, cervical spine, and
maxillofacial structures were performed using the standard protocol
without intravenous contrast. Multiplanar CT image reconstructions
of the cervical spine and maxillofacial structures were also
generated.

[Series 2: max soft · axial · 0.37mm/px · z∈[-210,-52]mm · 9 of 93 slices shown, 12 images]
[im 7/93  brain]
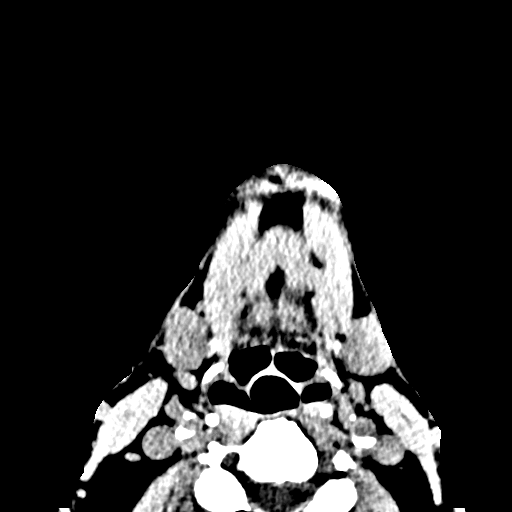
[im 7/93  bone]
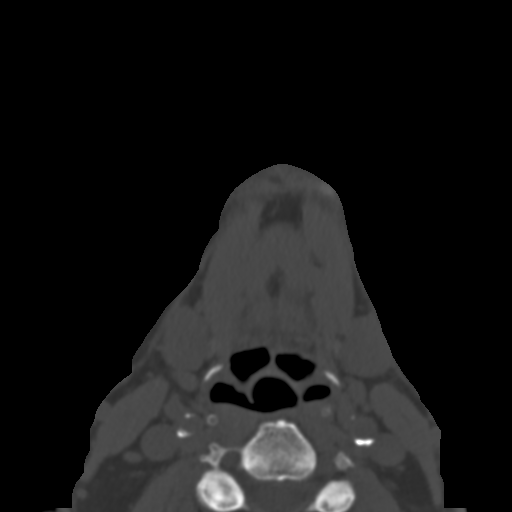
[im 16/93  bone]
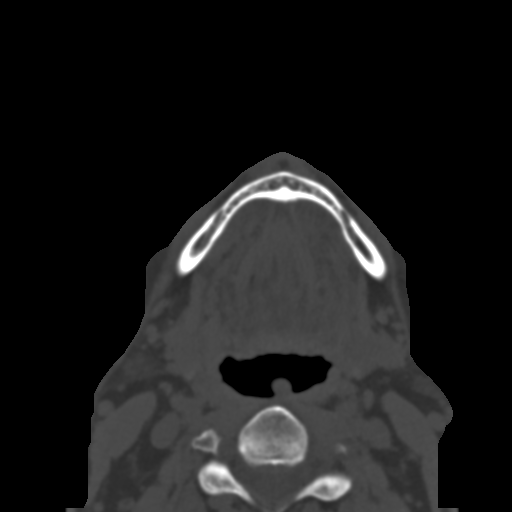
[im 26/93  bone]
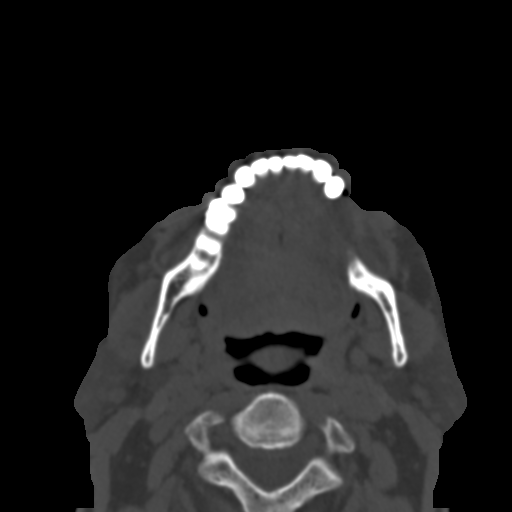
[im 35/93  bone]
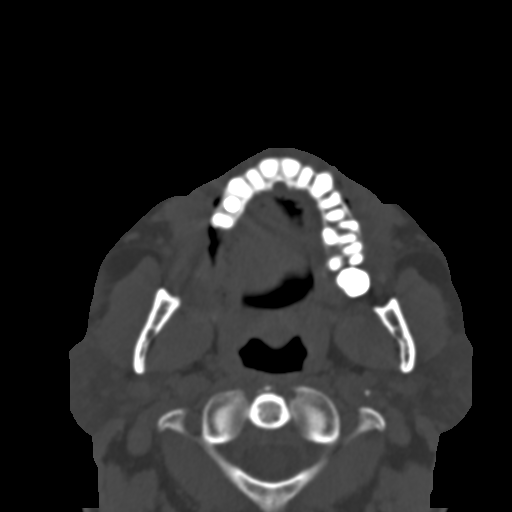
[im 48/93  brain]
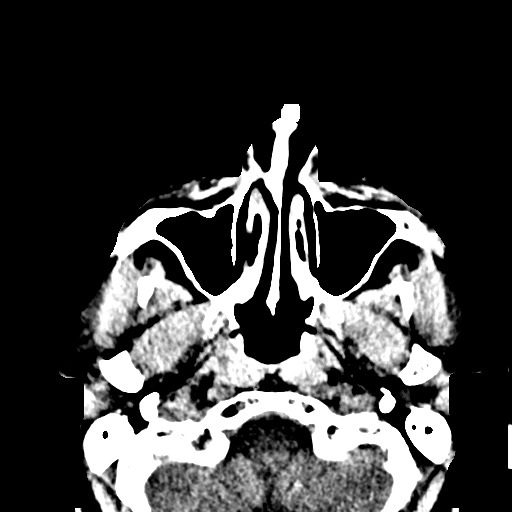
[im 48/93  bone]
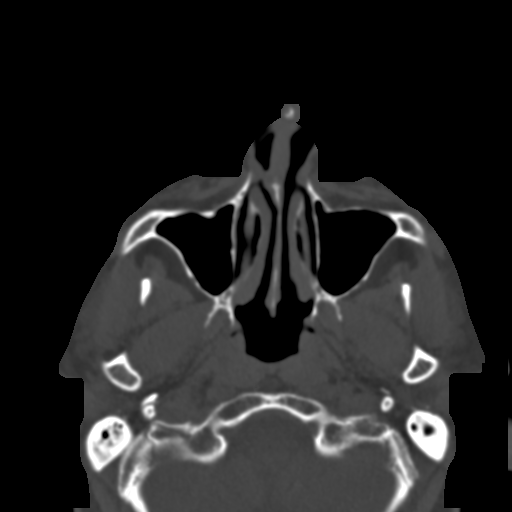
[im 58/93  bone]
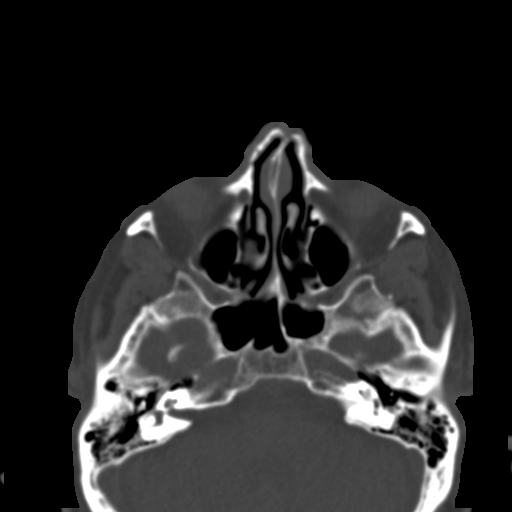
[im 67/93  bone]
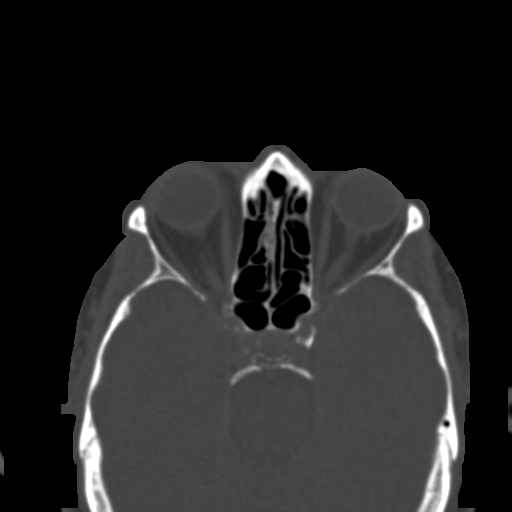
[im 77/93  bone]
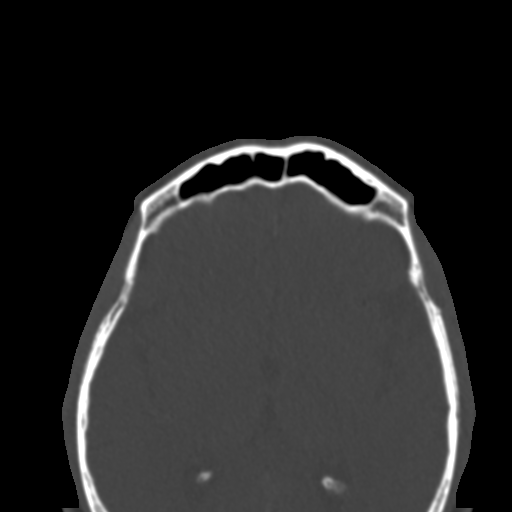
[im 86/93  brain]
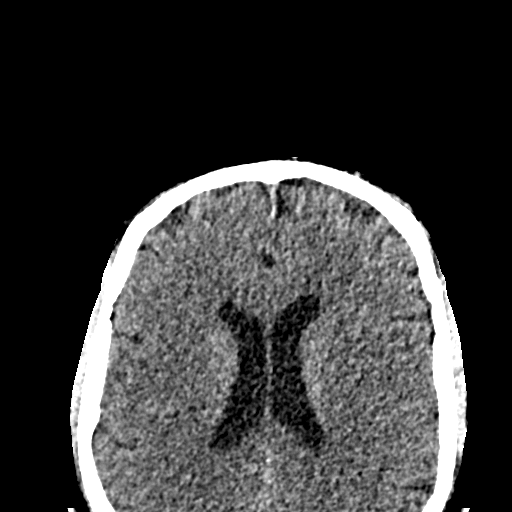
[im 86/93  bone]
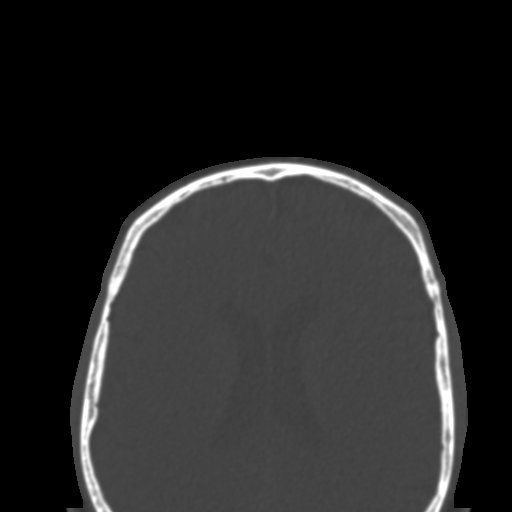

[Series 6: coronal soft · coronal · 0.39mm/px · 3 of 114 slices shown]
[im 38/114  bone]
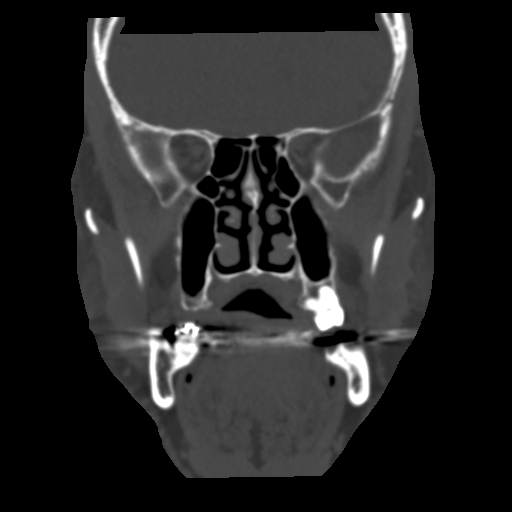
[im 51/114  bone]
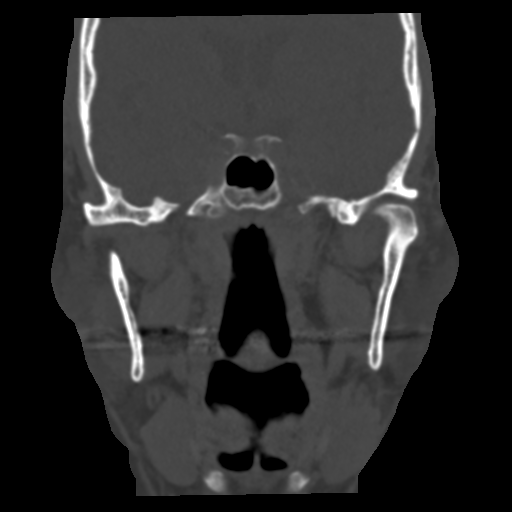
[im 63/114  bone]
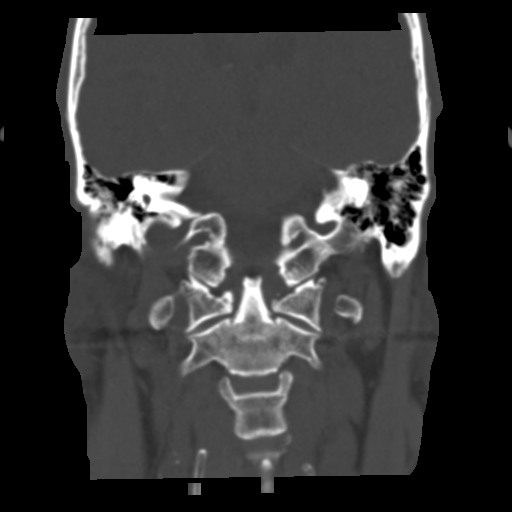

[Series 10: sag (id) · sagittal · 0.39mm/px · 3 of 100 slices shown]
[im 34/100  bone]
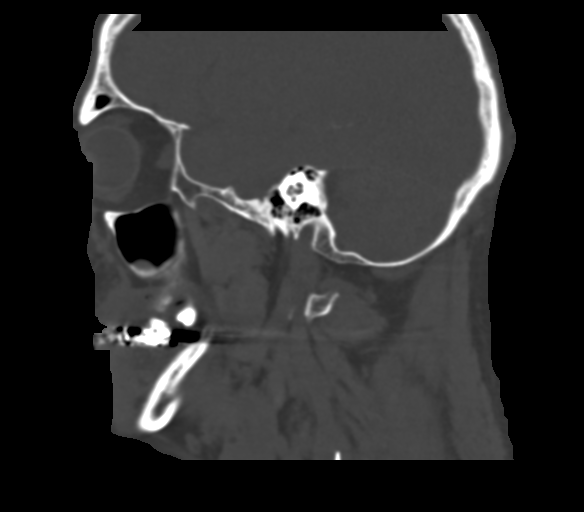
[im 50/100  bone]
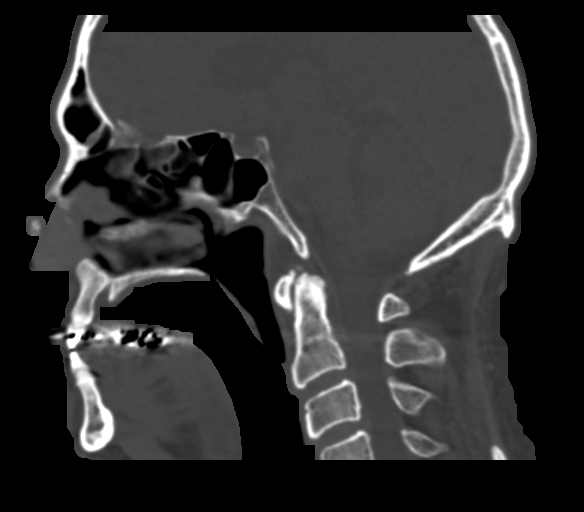
[im 67/100  bone]
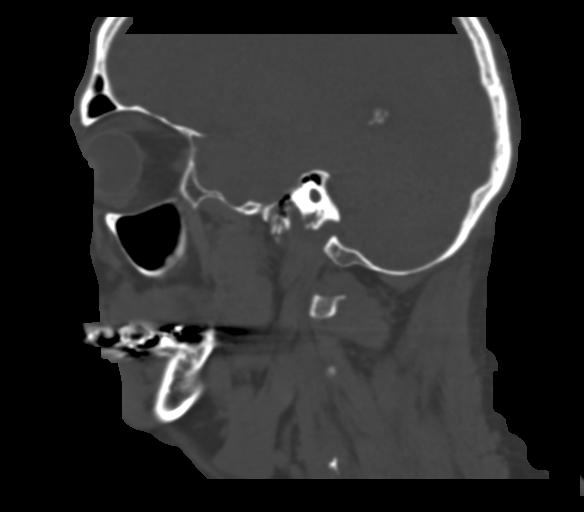

[15 of 47 positions shown; findings below may reference images not displayed]

FINDINGS: CT HEAD FINDINGS

Brain: No acute infarct or intracranial hemorrhage. No mass lesion.
No midline shift, ventriculomegaly or extra-axial fluid collection.

Vascular: No hyperdense vessel or unexpected calcification.
Bilateral skull base atherosclerotic calcifications.

Skull: No calvarial fracture.

Other: None.

CT MAXILLOFACIAL FINDINGS

Osseous: Age indeterminate posttraumatic deformity involving the
bilateral nasal bones with slight leftward deviation.

Orbits: Right periorbital soft tissue swelling. No walled-off fluid
collection. Normal left orbit.

Sinuses: Minimal frontal and maxillary sinus mucosal thickening.

Soft tissues: Please see above.

CT CERVICAL SPINE FINDINGS

Alignment: Straightening of lordosis.

Skull base and vertebrae: No acute fracture. No primary bone lesion
or focal pathologic process.

Soft tissues and spinal canal: No prevertebral fluid or swelling. No
visible canal hematoma.

Disc levels: Mild multilevel degenerative changes. Patent bony
spinal canal and neural foramen.

Upper chest: No acute finding.

Other: None.
IMPRESSION: Head CT:

No acute intracranial process.

Maxillofacial CT:

Right periorbital soft tissue swelling.  No orbital fracture.

Age indeterminate bilateral nasal bone deformity with slight
leftward deviation.

CT cervical spine:

No acute fracture or traumatic listhesis.

## 2022-09-14 IMAGING — CR DG LUMBAR SPINE 2-3V
3 series · 3 of 3 positions shown · non-contrast
Comparison: Correlation made with lumbar spine MRI from [id number]

CLINICAL DATA: Fall

EXAM:
LUMBAR SPINE - 2-3 VIEW

[l-spine ap]
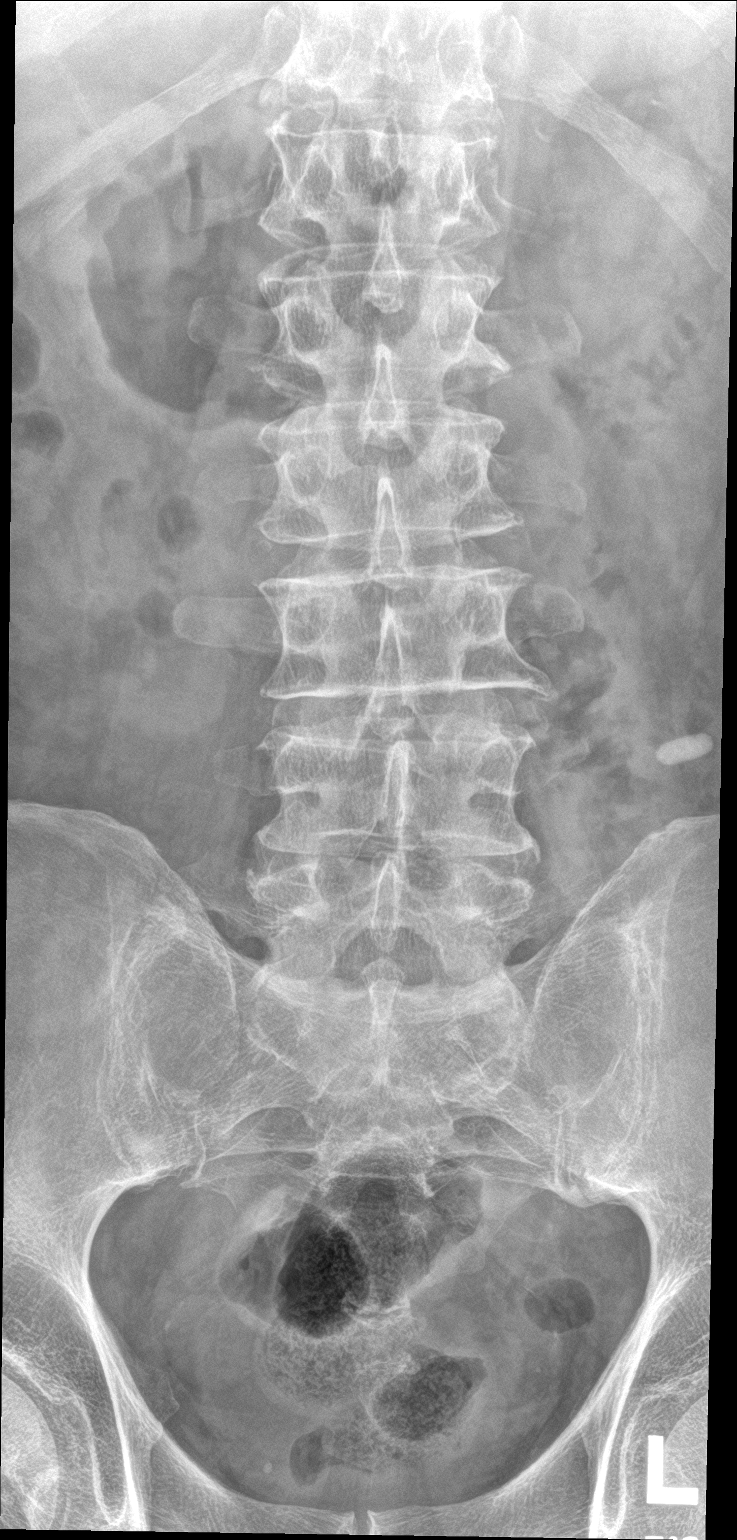

[l-spine lat]
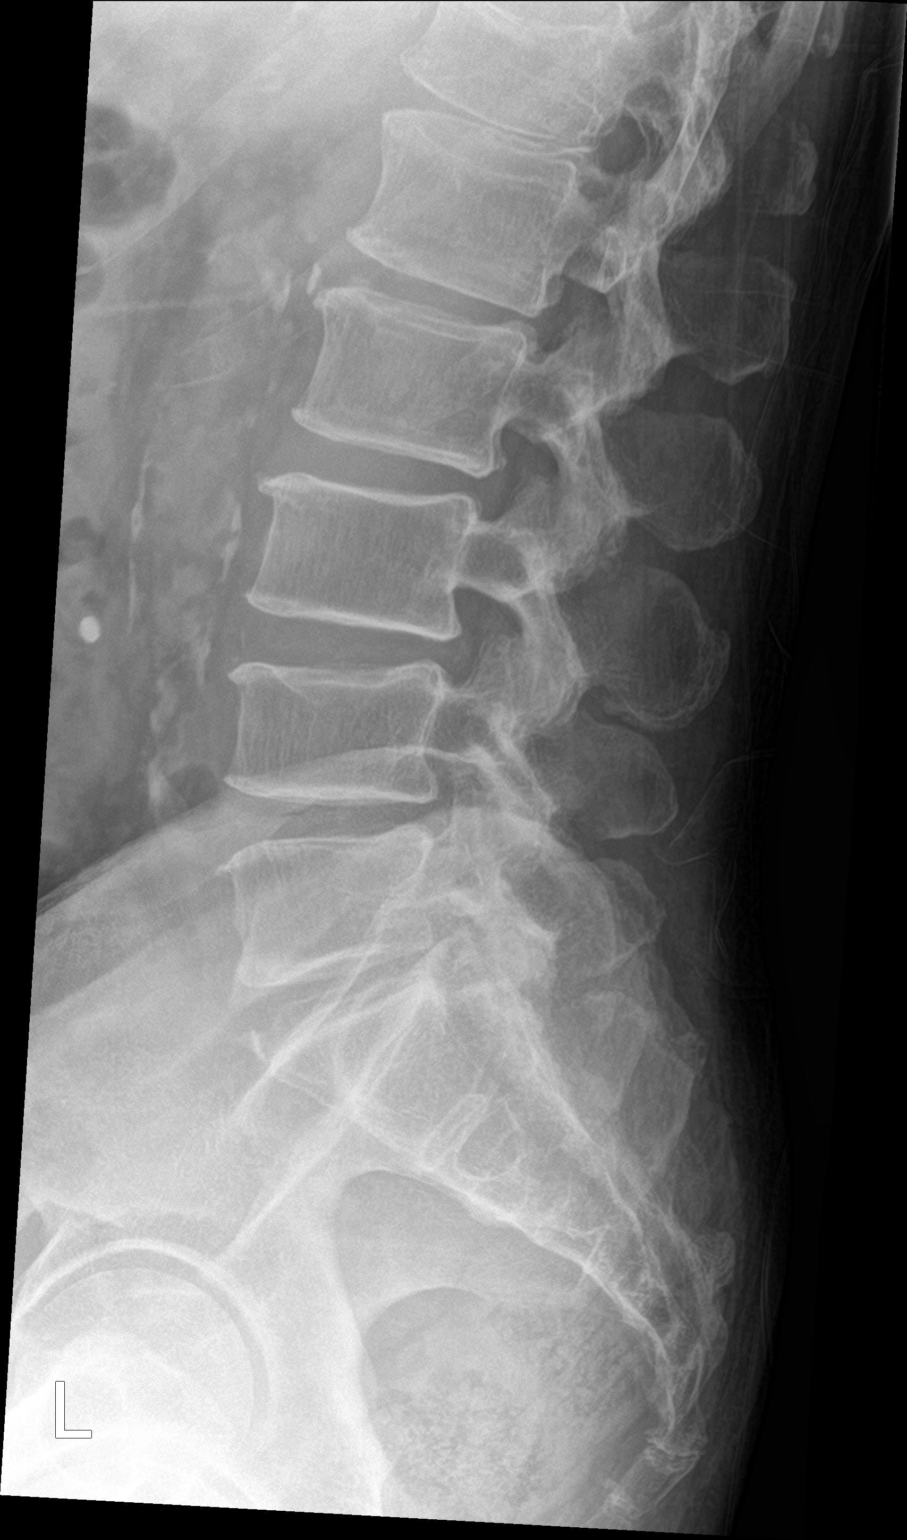

[l-spine spot]
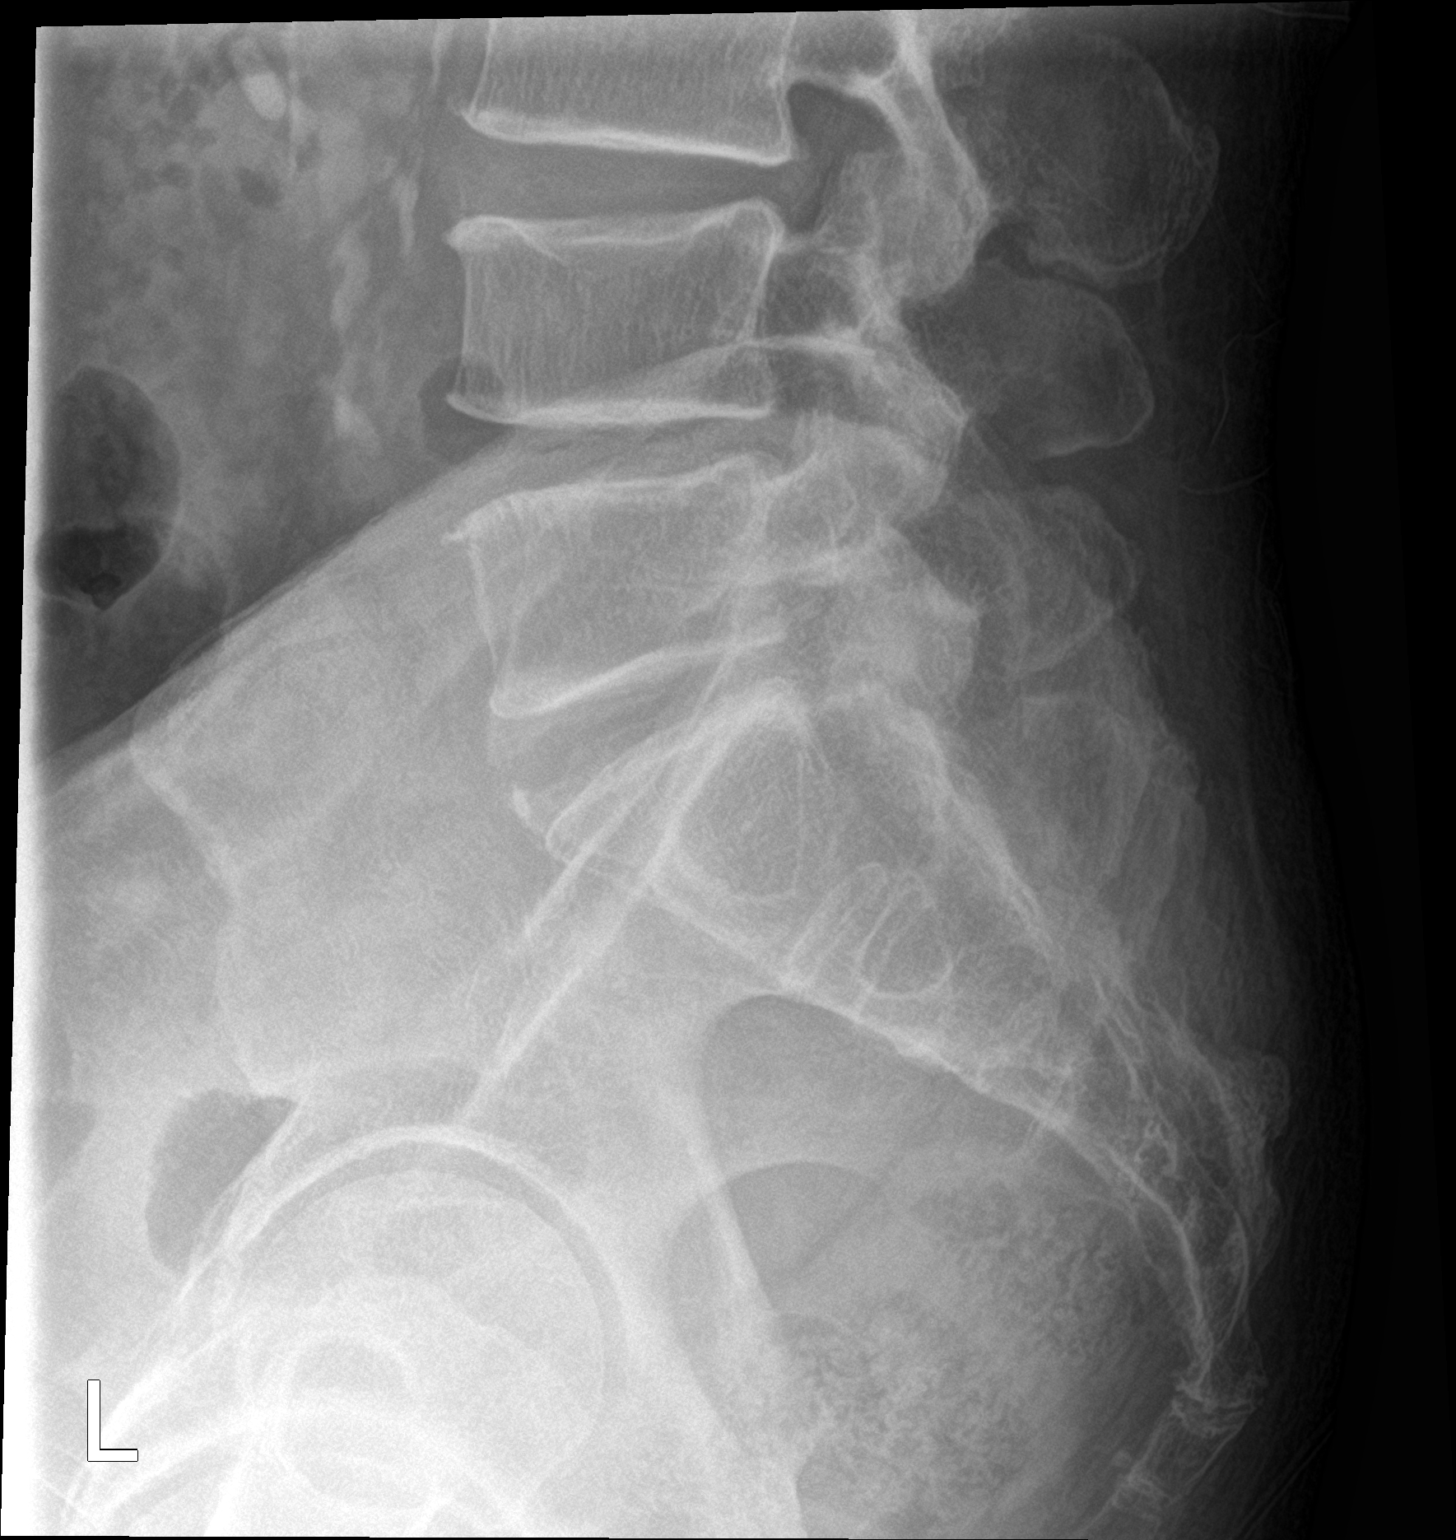

[3 of 3 positions shown; findings below may reference images not displayed]

FINDINGS: Chronic loss of height at the superior endplate of L4. No acute
fracture identified. Multilevel disc space narrowing with endplate
osteophytes. Multilevel facet hypertrophy. Aortic calcification.
IMPRESSION: No acute fracture.

## 2022-10-23 ENCOUNTER — Other Ambulatory Visit: Payer: Self-pay | Admitting: Family Medicine

## 2022-10-23 DIAGNOSIS — E785 Hyperlipidemia, unspecified: Secondary | ICD-10-CM

## 2022-10-25 ENCOUNTER — Other Ambulatory Visit: Payer: Self-pay | Admitting: Family Medicine

## 2022-10-25 ENCOUNTER — Telehealth: Payer: Self-pay | Admitting: Family Medicine

## 2022-10-25 DIAGNOSIS — J302 Other seasonal allergic rhinitis: Secondary | ICD-10-CM

## 2022-10-25 NOTE — Telephone Encounter (Signed)
LVM for pt to rtn my call to schedule AWV with [facility] call back # [phone] 

## 2022-11-20 ENCOUNTER — Ambulatory Visit: Payer: PPO

## 2022-12-11 ENCOUNTER — Other Ambulatory Visit: Payer: Self-pay | Admitting: Family Medicine

## 2022-12-25 ENCOUNTER — Ambulatory Visit (INDEPENDENT_AMBULATORY_CARE_PROVIDER_SITE_OTHER): Payer: PPO

## 2022-12-25 VITALS — Ht 69.0 in | Wt 182.0 lb

## 2022-12-25 DIAGNOSIS — Z0000 Encounter for general adult medical examination without abnormal findings: Secondary | ICD-10-CM | POA: Diagnosis not present

## 2022-12-25 NOTE — Progress Notes (Signed)
I connected with  [name] on 12/25/22 by a audio enabled telemedicine application and verified that I am speaking with the correct person using two identifiers.  Patient Location: Home  Provider Location: Office/Clinic  I discussed the limitations of evaluation and management by telemedicine. The patient expressed understanding and agreed to proceed.  Subjective:   Thomas Todd is a 72 y.o. male who presents for Medicare Annual/Subsequent preventive examination.  Review of Systems      Cardiac Risk Factors include: advanced age (>81men, >77 women);diabetes mellitus;hypertension;sedentary lifestyle     Objective:    Today's Vitals   12/25/22 1103  Weight: 182 lb (82.6 kg)  Height: 5\' 9"  (1.753 m)   Body mass index is 26.88 kg/m.     12/25/2022   11:16 AM 01/05/2022   11:38 AM 12/30/2021    8:18 AM 01/12/2021   10:44 AM 06/07/2020    8:10 AM 01/01/2020    9:02 AM 05/04/2017    2:17 PM  Advanced Directives  Does Patient Have a Medical Advance Directive? Yes No No Yes Yes No Yes  Type of Estate agent of [place];Living will   Healthcare Power of [place];Living will   Healthcare Power of Attorney  Does patient want to make changes to medical advance directive? No - Patient declined        Copy of Healthcare Power of Attorney in Chart? Yes - validated most recent copy scanned in chart (See row information)   No - copy requested     Would patient like information on creating a medical advance directive?  No - Patient declined  No - Patient declined  Yes (MAU/Ambulatory/Procedural Areas - Information given)     Current Medications (verified) Outpatient Encounter Medications as of 12/25/2022  Medication Sig   Ascorbic Acid (VITAMIN C) 1000 MG tablet Take 1,000 mg by mouth daily.   aspirin EC 81 MG tablet Take 81 mg by mouth daily.   Blood Glucose Monitoring Suppl (ONE TOUCH ULTRA 2) w/Device KIT Use as instructed to check blood sugar daily as  needed.  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   Cholecalciferol (VITAMIN D-3) 25 MCG (1000 UT) CAPS Take 2 capsules (2,000 Units total) by mouth daily.   Continuous Blood Gluc Receiver (FREESTYLE LIBRE 2 READER) DEVI 1 Device by Does not apply route See admin instructions. Use reader for continues glucose monitoring   Continuous Blood Gluc Sensor (FREESTYLE LIBRE 2 SENSOR) MISC INJECT 1 DEVICE INTO THE SKIN EVERY 14 DAYS   docusate sodium (COLACE) 100 MG capsule Take 100 mg by mouth 2 (two) times daily.   esomeprazole (NEXIUM) 40 MG capsule TAKE 1 CAPSULE BY MOUTH DAILY AT 12 NOON   ezetimibe (ZETIA) 10 MG tablet TAKE 1 TABLET BY MOUTH ONCE DAILY   fluticasone (FLONASE) 50 MCG/ACT nasal spray PLACE 1 SPRAY INTO BOTH NOSTRILS TWICE DAILY   folic acid (FOLVITE) 400 MCG tablet Take 400 mcg by mouth daily.   ibuprofen (ADVIL) 200 MG tablet Take 400 mg by mouth every 6 (six) hours as needed.   Lancets (ONETOUCH ULTRASOFT) lancets Use as instructed to check blood sugar daily as needed.  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   levocetirizine (XYZAL) 5 MG tablet TAKE 1 TABLET BY MOUTH IN THE EVENING   lisinopril-hydrochlorothiazide (ZESTORETIC) 10-12.5 MG tablet TAKE 1/2 A TABLET BY MOUTH DAILY.   metFORMIN (GLUCOPHAGE) 500 MG tablet TAKE 1 TABLET BY MOUTH IN THE MORNING   Omega-3 Fatty Acids (  FISH OIL PO) Take 2,400 mg by mouth daily.   ONETOUCH ULTRA test strip USE AS INSTRUCTED TO CHECK BLOOD SUGAR DAILY AS NEEDED. 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   potassium chloride (KLOR-CON) 10 MEQ tablet TAKE 1 TABLET BY MOUTH TWICE A DAY   pravastatin (PRAVACHOL) 20 MG tablet TAKE 1 TABLET BY MOUTH DAILY   tadalafil (CIALIS) 5 MG tablet TAKE 1 TABLET BY MOUTH ONCE A DAY FOR BPH   vitamin B-12 (CYANOCOBALAMIN) 1000 MCG tablet Take 1 tablet (1,000 mcg total) by mouth daily.   No facility-administered encounter medications on file as of 12/25/2022.    Allergies (verified) Atorvastatin    History: Past Medical History:  Diagnosis Date   Allergy    Anemia    Arthritis    Diabetes mellitus without complication    no medications taken 05-04-17   Dyspnea    GERD (gastroesophageal reflux disease)    Hyperlipidemia    Hypertension    LBP (low back pain)    with sciatica    Pancreatitis    Pancreatitis etoh 08/1998   Past Surgical History:  Procedure Laterality Date   COLONOSCOPY     COLONOSCOPY WITH PROPOFOL N/A 06/07/2020   Procedure: COLONOSCOPY WITH PROPOFOL;  Surgeon: Toney Reil, MD;  Location: [facility] ENDOSCOPY;  Service: Gastroenterology;  Laterality: N/A;   dental implant     LAMINECTOMY  1993   L5/S1   SEPTOPLASTY  1970   TRIGGER FINGER RELEASE Left 01/05/2022   Procedure: RELEASE TRIGGER FINGER/A-1 PULLEY;  Surgeon: Christena Flake, MD;  Location: [facility] ORS;  Service: Orthopedics;  Laterality: Left;   Family History  Problem Relation Age of Onset   Hypertension Mother    Diabetes Mother    Hyperlipidemia Mother    Hypothyroidism Mother    Dementia Mother    Alcohol abuse Father    Heart disease Father    Heart attack Father    Diabetes Sister    Colon cancer Neg Hx    Prostate cancer Neg Hx    Esophageal cancer Neg Hx    Stomach cancer Neg Hx    Rectal cancer Neg Hx    Social History   Socioeconomic History   Marital status: Married    Spouse name: Joyce Gross   Number of children: Not on file   Years of education: Not on file   Highest education level: Not on file  Occupational History   Not on file  Tobacco Use   Smoking status: Former    Years: 40    Types: Cigarettes    Quit date: 09/17/2009    Years since quitting: 13.2   Smokeless tobacco: Never   Tobacco comments:    vapor  Vaping Use   Vaping Use: Every day   Substances: Nicotine, Flavoring   Devices: vaping only   Substance and Sexual Activity   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   Drug use: No   Sexual activity: Not on file  Other Topics Concern   Not on  file  Social History Narrative   Sober since 1999   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, 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, retired as of 2015   Married 1975   Prev had 1 daughter, she chronically ill and died 14-Jul-2016   Social Determinants of Health   Financial Resource Strain: Low Risk  (12/25/2022)   Overall Financial Resource Strain (CARDIA)    Difficulty of Paying Living Expenses: Not hard at all  Food Insecurity: No Food Insecurity (12/25/2022)   Hunger Vital Sign    Worried About Running Out of Food in the Last Year: Never true    Ran Out of Food in the Last Year: Never true  Transportation Needs: No Transportation Needs (12/25/2022)   PRAPARE - Administrator, Civil Service (Medical): No    Lack of Transportation (Non-Medical): No  Physical Activity: Inactive (12/25/2022)   Exercise Vital Sign    Days of Exercise per Week: 0 days    Minutes of Exercise per Session: 0 min  Stress: No Stress Concern Present (12/25/2022)   [employer] of Occupational Health - Occupational Stress Questionnaire    Feeling of Stress : Not at all  Social Connections: Moderately Isolated (12/25/2022)   Social Connection and Isolation Panel [NHANES]    Frequency of Communication with Friends and Family: More than three times a week    Frequency of Social Gatherings with Friends and Family: More than three times a week    Attends Religious Services: Never    Database administrator or Organizations: No    Attends Engineer, structural: Never    Marital Status: Married    Tobacco Counseling Counseling given: Not Answered Tobacco comments: vapor   Clinical Intake:  Pre-visit preparation completed: Yes  Pain : No/denies pain     Nutritional Risks: None Diabetes: Yes CBG done?: Yes (120 today per pt) CBG resulted in Enter/ Edit results?: No Did pt. bring in CBG monitor from home?: No  How often do you need to have someone help you when you read instructions, pamphlets, or other  written materials from your doctor or pharmacy?: 1 - Never  Diabetic? Nutrition Risk Assessment:  Has the patient had any N/V/D within the last 2 months?  No  Does the patient have any non-healing wounds?  No  Has the patient had any unintentional weight loss or weight gain?  No   Diabetes:  Is the patient diabetic?  Yes  If diabetic, was a CBG obtained today?  Yes  Did the patient bring in their glucometer from home?  No  How often do you monitor your CBG's? Continuous monitor.   Financial Strains and Diabetes Management:  Are you having any financial strains with the device, your supplies or your medication? No .  Does the patient want to be seen by Chronic Care Management for management of their diabetes?  No  Would the patient like to be referred to a Nutritionist or for Diabetic Management?  No   Diabetic Exams:  Diabetic Eye Exam: Completed 06/13/22 [facility] Diabetic Foot Exam: Completed 11/24/21 PCP    Interpreter Needed?: No  Information entered by :: C.Aikeem Lilley LPn   Activities of Daily Living    12/25/2022   11:16 AM 12/30/2021    8:20 AM  In your present state of health, do you have any difficulty performing the following activities:  Hearing? 1   Comment wears aids   Vision? 0   Difficulty concentrating or making decisions? 0   Walking or climbing stairs? 0   Dressing or bathing? 0   Doing errands, shopping? 0 0  Preparing Food and eating ? N   Using the Toilet? N   In the past six months, have you accidently leaked urine? N   Do you have problems with loss of bowel control? N   Managing your Medications? N   Managing your Finances? N   Housekeeping or managing your Housekeeping?  N     Patient Care Team: Joaquim Nam, MD as PCP - General (Family Medicine)  Indicate any recent Medical Services you may have received from other than [facility] providers in the past year (date may be approximate).     Assessment:   This is a routine wellness  examination for Thomas Todd.  Hearing/Vision screen Hearing Screening - Comments:: Aids Vision Screening - Comments:: Glasses - [facility]  Dietary issues and exercise activities discussed: Current Exercise Habits: The patient does not participate in regular exercise at present, Exercise limited by: None identified   Goals Addressed             This Visit's Progress    Patient Stated       Maintain current level of health        Depression Screen    12/25/2022   11:15 AM 03/06/2022    9:55 AM 01/12/2021   11:03 AM 09/20/2020   10:48 AM 01/01/2020    9:03 AM 11/13/2017    9:06 AM 11/07/2016    8:54 AM  PHQ 2/9 Scores  PHQ - 2 Score 0 0 0 0 0 0 0  PHQ- 9 Score  0 0  0      Fall Risk    12/25/2022   11:09 AM 03/06/2022    8:58 AM 01/12/2021   10:48 AM 09/20/2020   10:48 AM 01/01/2020    9:03 AM  Fall Risk   Falls in the past year? 0 0 0 0 0  Number falls in past yr: 0 0 0 0 0  Injury with Fall? 0 0 0 0 0  Risk for fall due to : No Fall Risks No Fall Risks   Medication side effect  Follow up Falls prevention discussed;Falls evaluation completed   Falls evaluation completed Falls prevention discussed;Falls evaluation completed    FALL RISK PREVENTION PERTAINING TO THE HOME:  Any stairs in or around the home? No  If so, are there any without handrails? No  Home free of loose throw rugs in walkways, pet beds, electrical cords, etc? Yes  Adequate lighting in your home to reduce risk of falls? Yes   ASSISTIVE DEVICES UTILIZED TO PREVENT FALLS:  Life alert? No  Use of a cane, walker or w/c? No  Grab bars in the bathroom? Yes  Shower chair or bench in shower? No  Elevated toilet seat or a handicapped toilet? No    Cognitive Function:    01/12/2021   11:04 AM 01/01/2020    9:08 AM  MMSE - Mini Mental State Exam  Not completed: Unable to complete   Orientation to time  5  Orientation to Place  5  Registration  3  Attention/ Calculation  5  Recall  3  Language- repeat  1         12/25/2022   11:17 AM  6CIT Screen  What Year? 0 points  What month? 0 points  What time? 0 points  Count back from 20 0 points  Months in reverse 0 points  Repeat phrase 0 points  Total Score 0 points    Immunizations Immunization History  Administered Date(s) Administered   Fluad Quad(high Dose 65+) 11/01/2020, 08/07/2022   Influenza Split 07/26/2012   Influenza Whole 11/11/2009   Influenza,inj,Quad PF,6+ Mos 07/30/2013, 07/03/2014, 06/28/2015, 06/15/2016, 07/05/2017, 06/03/2018, 05/08/2019   PFIZER(Purple Top)SARS-COV-2 Vaccination 05/24/2020, 06/14/2020   Pneumococcal Conjugate-13 07/16/2018   Pneumococcal Polysaccharide-23 01/26/2015   Td 03/17/2002   Tdap 02/13/2012  Zoster Recombinat (Shingrix) 01/23/2018, 03/25/2018   Zoster, Live 09/03/2014    TDAP status: Due, Education has been provided regarding the importance of this vaccine. Advised may receive this vaccine at local pharmacy or Health Dept. Aware to provide a copy of the vaccination record if obtained from local pharmacy or Health Dept. Verbalized acceptance and understanding.  Flu Vaccine status: Up to date  Pneumococcal vaccine status: Up to date  Covid-19 vaccine status: Information provided on how to obtain vaccines.   Qualifies for Shingles Vaccine? Yes   Zostavax completed Yes   Shingrix Completed?: Yes  Screening Tests Health Maintenance  Topic Date Due   Diabetic kidney evaluation - Urine ACR  09/10/2011   DTaP/Tdap/Td (3 - Td or Tdap) 02/12/2022   COVID-19 Vaccine (3 - 2023-24 season) 05/19/2022   Diabetic kidney evaluation - eGFR measurement  11/18/2022   FOOT EXAM  11/25/2022   HEMOGLOBIN A1C  02/05/2023   INFLUENZA VACCINE  04/19/2023   COLONOSCOPY (Pts 45-5749yrs Insurance coverage will need to be confirmed)  06/08/2023   OPHTHALMOLOGY EXAM  06/14/2023   Pneumonia Vaccine 1765+ Years old (3 of 3 - PPSV23 or PCV20) 07/17/2023   Medicare Annual Wellness (AWV)  12/25/2023   Hepatitis  C Screening  Completed   Zoster Vaccines- Shingrix  Completed   HPV VACCINES  Aged Out    Health Maintenance  Health Maintenance Due  Topic Date Due   Diabetic kidney evaluation - Urine ACR  09/10/2011   DTaP/Tdap/Td (3 - Td or Tdap) 02/12/2022   COVID-19 Vaccine (3 - 2023-24 season) 05/19/2022   Diabetic kidney evaluation - eGFR measurement  11/18/2022   FOOT EXAM  11/25/2022    Colorectal cancer screening: Type of screening: Colonoscopy. Completed 05/2020. Repeat every 3 years  Lung Cancer Screening: (Low Dose CT Chest recommended if Age 80-80 years, 30 pack-year currently smoking OR have quit w/in 15years.) does not qualify.   Lung Cancer Screening Referral: no  Additional Screening:  Hepatitis C Screening: does qualify; Completed 04/25/16  Vision Screening: Recommended annual ophthalmology exams for early detection of glaucoma and other disorders of the eye. Is the patient up to date with their annual eye exam?  Yes  Who is the provider or what is the name of the office in which the patient attends annual eye exams? [facility] If pt is not established with a provider, would they like to be referred to a provider to establish care? No .   Dental Screening: Recommended annual dental exams for proper oral hygiene  Community Resource Referral / Chronic Care Management: CRR required this visit?  No   CCM required this visit?  No      Plan:     I have personally reviewed and noted the following in the patient's chart:   Medical and social history Use of alcohol, tobacco or illicit drugs  Current medications and supplements including opioid prescriptions. Patient is not currently taking opioid prescriptions. Functional ability and status Nutritional status Physical activity Advanced directives List of other physicians Hospitalizations, surgeries, and ER visits in previous 12 months Vitals Screenings to include cognitive, depression, and falls Referrals and  appointments  In addition, I have reviewed and discussed with patient certain preventive protocols, quality metrics, and best practice recommendations. A written personalized care plan for preventive services as well as general preventive health recommendations were provided to patient.     Maryan PulsChristan M Dunya Meiners, LPN   1/6/10964/04/2023   Nurse Notes: None

## 2022-12-25 NOTE — Patient Instructions (Signed)
Thomas Todd , Thank you for taking time to come for your Medicare Wellness Visit. I appreciate your ongoing commitment to your health goals. Please review the following plan we discussed and let me know if I can assist you in the future.   These are the goals we discussed:  Goals      Patient Stated     01/01/2020, I will maintain and continue medications as prescribed.      Patient Stated     01/12/2021, I will maintain and continue medications as prescribed.      Patient Stated     Maintain current level of health         This is a list of the screening recommended for you and due dates:  Health Maintenance  Topic Date Due   Yearly kidney health urinalysis for diabetes  09/10/2011   DTaP/Tdap/Td vaccine (3 - Td or Tdap) 02/12/2022   COVID-19 Vaccine (3 - 2023-24 season) 05/19/2022   Yearly kidney function blood test for diabetes  11/18/2022   Complete foot exam   11/25/2022   Hemoglobin A1C  02/05/2023   Flu Shot  04/19/2023   Colon Cancer Screening  06/08/2023   Eye exam for diabetics  06/14/2023   Pneumonia Vaccine (3 of 3 - PPSV23 or PCV20) 07/17/2023   Medicare Annual Wellness Visit  12/25/2023   Hepatitis C Screening: USPSTF Recommendation to screen - Ages 18-79 yo.  Completed   Zoster (Shingles) Vaccine  Completed   HPV Vaccine  Aged Out    Advanced directives: copy is on file in chart.  Conditions/risks identified: Aim for 30 minutes of exercise or brisk walking, 6-8 glasses of water, and 5 servings of fruits and vegetables each day.   Next appointment: Follow up in one year for your annual wellness visit. 12/26/23 @ 10:30 telephone visit.  Preventive Care 50 Years and Older, Male  Preventive care refers to lifestyle choices and visits with your health care provider that can promote health and wellness. What does preventive care include? A yearly physical exam. This is also called an annual well check. Dental exams once or twice a year. Routine eye exams. Ask  your health care provider how often you should have your eyes checked. Personal lifestyle choices, including: Daily care of your teeth and gums. Regular physical activity. Eating a healthy diet. Avoiding tobacco and drug use. Limiting alcohol use. Practicing safe sex. Taking low doses of aspirin every day. Taking vitamin and mineral supplements as recommended by your health care provider. What happens during an annual well check? The services and screenings done by your health care provider during your annual well check will depend on your age, overall health, lifestyle risk factors, and family history of disease. Counseling  Your health care provider may ask you questions about your: Alcohol use. Tobacco use. Drug use. Emotional well-being. Home and relationship well-being. Sexual activity. Eating habits. History of falls. Memory and ability to understand (cognition). Work and work Astronomer. Screening  You may have the following tests or measurements: Height, weight, and BMI. Blood pressure. Lipid and cholesterol levels. These may be checked every 5 years, or more frequently if you are over 63 years old. Skin check. Lung cancer screening. You may have this screening every year starting at age 95 if you have a 30-pack-year history of smoking and currently smoke or have quit within the past 15 years. Fecal occult blood test (FOBT) of the stool. You may have this test every year starting at  age 59. Flexible sigmoidoscopy or colonoscopy. You may have a sigmoidoscopy every 5 years or a colonoscopy every 10 years starting at age 50. Prostate cancer screening. Recommendations will vary depending on your family history and other risks. Hepatitis C blood test. Hepatitis B blood test. Sexually transmitted disease (STD) testing. Diabetes screening. This is done by checking your blood sugar (glucose) after you have not eaten for a while (fasting). You may have this done every 1-3  years. Abdominal aortic aneurysm (AAA) screening. You may need this if you are a current or former smoker. Osteoporosis. You may be screened starting at age 83 if you are at high risk. Talk with your health care provider about your test results, treatment options, and if necessary, the need for more tests. Vaccines  Your health care provider may recommend certain vaccines, such as: Influenza vaccine. This is recommended every year. Tetanus, diphtheria, and acellular pertussis (Tdap, Td) vaccine. You may need a Td booster every 10 years. Zoster vaccine. You may need this after age 68. Pneumococcal 13-valent conjugate (PCV13) vaccine. One dose is recommended after age 4. Pneumococcal polysaccharide (PPSV23) vaccine. One dose is recommended after age 57. Talk to your health care provider about which screenings and vaccines you need and how often you need them. This information is not intended to replace advice given to you by your health care provider. Make sure you discuss any questions you have with your health care provider. Document Released: 10/01/2015 Document Revised: 05/24/2016 Document Reviewed: 07/06/2015 Elsevier Interactive Patient Education  2017 [place] Prevention in the Home Falls can cause injuries. They can happen to people of all ages. There are many things you can do to make your home safe and to help prevent falls. What can I do on the outside of my home? Regularly fix the edges of walkways and driveways and fix any cracks. Remove anything that might make you trip as you walk through a door, such as a raised step or threshold. Trim any bushes or trees on the path to your home. Use bright outdoor lighting. Clear any walking paths of anything that might make someone trip, such as rocks or tools. Regularly check to see if handrails are loose or broken. Make sure that both sides of any steps have handrails. Any raised decks and porches should have guardrails on the  edges. Have any leaves, snow, or ice cleared regularly. Use sand or salt on walking paths during winter. Clean up any spills in your garage right away. This includes oil or grease spills. What can I do in the bathroom? Use night lights. Install grab bars by the toilet and in the tub and shower. Do not use towel bars as grab bars. Use non-skid mats or decals in the tub or shower. If you need to sit down in the shower, use a plastic, non-slip stool. Keep the floor dry. Clean up any water that spills on the floor as soon as it happens. Remove soap buildup in the tub or shower regularly. Attach bath mats securely with double-sided non-slip rug tape. Do not have throw rugs and other things on the floor that can make you trip. What can I do in the bedroom? Use night lights. Make sure that you have a light by your bed that is easy to reach. Do not use any sheets or blankets that are too big for your bed. They should not hang down onto the floor. Have a firm chair that has side arms. You can  use this for support while you get dressed. Do not have throw rugs and other things on the floor that can make you trip. What can I do in the kitchen? Clean up any spills right away. Avoid walking on wet floors. Keep items that you use a lot in easy-to-reach places. If you need to reach something above you, use a strong step stool that has a grab bar. Keep electrical cords out of the way. Do not use floor polish or wax that makes floors slippery. If you must use wax, use non-skid floor wax. Do not have throw rugs and other things on the floor that can make you trip. What can I do with my stairs? Do not leave any items on the stairs. Make sure that there are handrails on both sides of the stairs and use them. Fix handrails that are broken or loose. Make sure that handrails are as long as the stairways. Check any carpeting to make sure that it is firmly attached to the stairs. Fix any carpet that is loose or  worn. Avoid having throw rugs at the top or bottom of the stairs. If you do have throw rugs, attach them to the floor with carpet tape. Make sure that you have a light switch at the top of the stairs and the bottom of the stairs. If you do not have them, ask someone to add them for you. What else can I do to help prevent falls? Wear shoes that: Do not have high heels. Have rubber bottoms. Are comfortable and fit you well. Are closed at the toe. Do not wear sandals. If you use a stepladder: Make sure that it is fully opened. Do not climb a closed stepladder. Make sure that both sides of the stepladder are locked into place. Ask someone to hold it for you, if possible. Clearly mark and make sure that you can see: Any grab bars or handrails. First and last steps. Where the edge of each step is. Use tools that help you move around (mobility aids) if they are needed. These include: Canes. Walkers. Scooters. Crutches. Turn on the lights when you go into a dark area. Replace any light bulbs as soon as they burn out. Set up your furniture so you have a clear path. Avoid moving your furniture around. If any of your floors are uneven, fix them. If there are any pets around you, be aware of where they are. Review your medicines with your doctor. Some medicines can make you feel dizzy. This can increase your chance of falling. Ask your doctor what other things that you can do to help prevent falls. This information is not intended to replace advice given to you by your health care provider. Make sure you discuss any questions you have with your health care provider. Document Released: 07/01/2009 Document Revised: 02/10/2016 Document Reviewed: 10/09/2014 Elsevier Interactive Patient Education  2017 [employer].

## 2023-01-08 ENCOUNTER — Ambulatory Visit (INDEPENDENT_AMBULATORY_CARE_PROVIDER_SITE_OTHER): Payer: PPO | Admitting: Family Medicine

## 2023-01-08 ENCOUNTER — Encounter: Payer: Self-pay | Admitting: Family Medicine

## 2023-01-08 VITALS — BP 122/78 | HR 69 | Temp 97.9°F | Ht 69.0 in | Wt 182.0 lb

## 2023-01-08 DIAGNOSIS — Z0000 Encounter for general adult medical examination without abnormal findings: Secondary | ICD-10-CM

## 2023-01-08 DIAGNOSIS — E119 Type 2 diabetes mellitus without complications: Secondary | ICD-10-CM

## 2023-01-08 DIAGNOSIS — Z7984 Long term (current) use of oral hypoglycemic drugs: Secondary | ICD-10-CM

## 2023-01-08 DIAGNOSIS — Z125 Encounter for screening for malignant neoplasm of prostate: Secondary | ICD-10-CM | POA: Diagnosis not present

## 2023-01-08 DIAGNOSIS — E782 Mixed hyperlipidemia: Secondary | ICD-10-CM

## 2023-01-08 DIAGNOSIS — E559 Vitamin D deficiency, unspecified: Secondary | ICD-10-CM

## 2023-01-08 DIAGNOSIS — I10 Essential (primary) hypertension: Secondary | ICD-10-CM | POA: Diagnosis not present

## 2023-01-08 DIAGNOSIS — Z7189 Other specified counseling: Secondary | ICD-10-CM

## 2023-01-08 DIAGNOSIS — D518 Other vitamin B12 deficiency anemias: Secondary | ICD-10-CM

## 2023-01-08 LAB — LDL CHOLESTEROL, DIRECT: Direct LDL: 120 mg/dL

## 2023-01-08 LAB — CBC WITH DIFFERENTIAL/PLATELET
Basophils Absolute: 0 10*3/uL (ref 0.0–0.1)
Basophils Relative: 0.4 % (ref 0.0–3.0)
Eosinophils Absolute: 0.1 10*3/uL (ref 0.0–0.7)
Eosinophils Relative: 0.8 % (ref 0.0–5.0)
HCT: 39.1 % (ref 39.0–52.0)
Hemoglobin: 13.1 g/dL (ref 13.0–17.0)
Lymphocytes Relative: 30.8 % (ref 12.0–46.0)
Lymphs Abs: 2.4 10*3/uL (ref 0.7–4.0)
MCHC: 33.5 g/dL (ref 30.0–36.0)
MCV: 86.1 fl (ref 78.0–100.0)
Monocytes Absolute: 0.7 10*3/uL (ref 0.1–1.0)
Monocytes Relative: 8.5 % (ref 3.0–12.0)
Neutro Abs: 4.7 10*3/uL (ref 1.4–7.7)
Neutrophils Relative %: 59.5 % (ref 43.0–77.0)
Platelets: 195 10*3/uL (ref 150.0–400.0)
RBC: 4.54 Mil/uL (ref 4.22–5.81)
RDW: 13.4 % (ref 11.5–15.5)
WBC: 7.9 10*3/uL (ref 4.0–10.5)

## 2023-01-08 LAB — MICROALBUMIN / CREATININE URINE RATIO
Creatinine,U: 132.2 mg/dL
Microalb Creat Ratio: 0.7 mg/g (ref 0.0–30.0)
Microalb, Ur: 0.9 mg/dL (ref 0.0–1.9)

## 2023-01-08 LAB — LIPID PANEL
Cholesterol: 193 mg/dL (ref 0–200)
HDL: 46.1 mg/dL (ref >39.00)
NonHDL: 147.26
Total CHOL/HDL Ratio: 4
Triglycerides: 215 mg/dL — ABNORMAL HIGH (ref 0.0–149.0)
VLDL: 43 mg/dL — ABNORMAL HIGH (ref 0.0–40.0)

## 2023-01-08 LAB — COMPREHENSIVE METABOLIC PANEL
ALT: 26 U/L (ref 0–53)
AST: 16 U/L (ref 0–37)
Albumin: 4.5 g/dL (ref 3.5–5.2)
Alkaline Phosphatase: 46 U/L (ref 39–117)
BUN: 11 mg/dL (ref 6–23)
CO2: 29 mEq/L (ref 19–32)
Calcium: 9.7 mg/dL (ref 8.4–10.5)
Chloride: 101 mEq/L (ref 96–112)
Creatinine, Ser: 0.94 mg/dL (ref 0.40–1.50)
GFR: 82.66 mL/min (ref >60.00)
Glucose, Bld: 137 mg/dL — ABNORMAL HIGH (ref 70–99)
Potassium: 4.2 mEq/L (ref 3.5–5.1)
Sodium: 138 mEq/L (ref 135–145)
Total Bilirubin: 0.8 mg/dL (ref 0.2–1.2)
Total Protein: 6.3 g/dL (ref 6.0–8.3)

## 2023-01-08 LAB — HEMOGLOBIN A1C: Hgb A1c MFr Bld: 7.3 % — ABNORMAL HIGH (ref 4.6–6.5)

## 2023-01-08 LAB — VITAMIN D 25 HYDROXY (VIT D DEFICIENCY, FRACTURES): VITD: 42.86 ng/mL (ref 30.00–100.00)

## 2023-01-08 LAB — PSA, MEDICARE: PSA: 0.85 ng/ml (ref 0.10–4.00)

## 2023-01-08 LAB — VITAMIN B12: Vitamin B-12: >1500 pg/mL — ABNORMAL HIGH (ref 211–911)

## 2023-01-08 NOTE — Progress Notes (Unsigned)
Diabetes:  Using medications without difficulties: yes Hypoglycemic episodes:no Hyperglycemic episodes: see below.   Feet problems: see below.   Blood Sugars averaging: some higher sugars at night then it self corrects.   eye exam within last year: yes He had some variable sugar readings at the end of the sensor interval.  Sugar 145 this AM.    Hypertension:    Using medication without problems or lightheadedness: yes except if prolonged fasting.  Cautions d/w pt.   Chest pain with exertion:no Edema:no Short of breath:no Labs pending.    Elevated Cholesterol: Using medications without problems:yes Muscle aches: some foot cramping at night, at rest.  No claudication.   Diet compliance: yes Exercise: yes Labs pending.   He is going to f/u with GI about hemorrhoids.    Flu previously done Shingles previously done PNA previously done Tetanus previously done COVID-vaccine previously done Colonoscopy 2021 Prostate cancer screening 2024 Advance directive-wife designated if patient were incapacitated. We talked about AAA screening.  His wife has ongoing health concerns, d/w pt.    Still with slough from the foreskin. No burning.  No discharge.    PMH and SH reviewed.   Vital signs, Meds and allergies reviewed.  ROS: Per HPI unless specifically indicated in ROS section   GEN: nad, alert and oriented HEENT: ncat NECK: supple w/o LA CV: rrr PULM: ctab, no inc wob ABD: soft, +bs EXT: no edema SKIN: no acute rash  Diabetic foot exam: Normal inspection No skin breakdown No calluses  Normal DP pulses Normal sensation to light tough and monofilament Nails normal

## 2023-01-08 NOTE — Patient Instructions (Signed)
Let me check with the GI clinic.  Go to the lab on the way out.   If you have mychart we'll likely use that to update you.    Take care.  Glad to see you. Plan on recheck A1c in about 6 months at a visit.

## 2023-01-10 ENCOUNTER — Telehealth: Payer: Self-pay | Admitting: Family Medicine

## 2023-01-10 ENCOUNTER — Other Ambulatory Visit: Payer: Self-pay | Admitting: Family Medicine

## 2023-01-10 DIAGNOSIS — Z136 Encounter for screening for cardiovascular disorders: Secondary | ICD-10-CM

## 2023-01-10 NOTE — Assessment & Plan Note (Signed)
Advance directive- wife designated if patient were incapacitated.  

## 2023-01-10 NOTE — Telephone Encounter (Signed)
I need your help/clarification.  Patient and I talked about AAA screening.  He was under the impression that this was not needed after he had a conversation at your clinic.  If there is imaging that he had that I did not see in the EMR that would take care of his AAA screening requirement, please let me know.  Otherwise I can order the ultrasound.  Thanks.  Greatly appreciate your help.

## 2023-01-10 NOTE — Assessment & Plan Note (Signed)
Flu previously done Shingles previously done PNA previously done Tetanus previously done COVID-vaccine previously done Colonoscopy 2021 Prostate cancer screening 2024 Advance directive-wife designated if patient were incapacitated. We talked about AAA screening.  He was under the impression that this was not needed after he had a conversation with GI clinic.  I am going to check on this.

## 2023-01-10 NOTE — Assessment & Plan Note (Signed)
Continue pravastatin and Zetia.  See notes on labs.  Continue work on diet and exercise.

## 2023-01-10 NOTE — Assessment & Plan Note (Signed)
See notes on labs.  Continue metformin.  Continue work on diet and exercise. °

## 2023-01-10 NOTE — Assessment & Plan Note (Signed)
Continue lisinopril hydrochlorothiazide.  See notes on labs.  Continue work on diet and exercise. 

## 2023-01-11 NOTE — Telephone Encounter (Signed)
Please update patient.  I checked with GI.  It is still reasonable to get the AAA screening done.  Let me know if he consents for the u/s and I can sign the order.  Thanks.

## 2023-01-11 NOTE — Telephone Encounter (Signed)
Spoke with pt relaying Dr. Lianne Bushy message. Pt verbalizes understanding and agrees to have US done with Dr. Allegra Lai.

## 2023-01-12 ENCOUNTER — Encounter: Payer: Self-pay | Admitting: *Deleted

## 2023-01-12 NOTE — Telephone Encounter (Signed)
U/s ordered.  This will not be done at the GI office- notify pt.  Thanks.

## 2023-01-12 NOTE — Telephone Encounter (Signed)
Spoke with patient and advised him that [place] was ordered and he will get a call from radiology to get scheduled.

## 2023-01-23 ENCOUNTER — Other Ambulatory Visit: Payer: Self-pay | Admitting: Family Medicine

## 2023-01-23 ENCOUNTER — Ambulatory Visit
Admission: RE | Admit: 2023-01-23 | Discharge: 2023-01-23 | Disposition: A | Discharge status: Home / Self Care | Payer: PPO | Source: Ambulatory Visit | Attending: Family Medicine | Admitting: Family Medicine

## 2023-01-23 DIAGNOSIS — Z136 Encounter for screening for cardiovascular disorders: Secondary | ICD-10-CM | POA: Diagnosis not present

## 2023-01-23 DIAGNOSIS — Z87891 Personal history of nicotine dependence: Secondary | ICD-10-CM | POA: Diagnosis not present

## 2023-02-05 ENCOUNTER — Other Ambulatory Visit: Payer: Self-pay | Admitting: Family Medicine

## 2023-02-20 ENCOUNTER — Other Ambulatory Visit: Payer: Self-pay | Admitting: Family Medicine

## 2023-02-20 DIAGNOSIS — J302 Other seasonal allergic rhinitis: Secondary | ICD-10-CM

## 2023-04-09 ENCOUNTER — Other Ambulatory Visit: Payer: Self-pay | Admitting: Family Medicine

## 2023-04-13 ENCOUNTER — Other Ambulatory Visit: Payer: Self-pay | Admitting: Family Medicine

## 2023-04-30 ENCOUNTER — Telehealth: Payer: Self-pay

## 2023-04-30 DIAGNOSIS — Z86010 Personal history of colonic polyps: Secondary | ICD-10-CM

## 2023-04-30 NOTE — Telephone Encounter (Signed)
Patient sent a appointment request to schedule his repeat colonoscopy. Per patient Appointment Request From: [name]   With Provider: Lannette Donath [facility] [place]]   Preferred Date Range: 06/12/2023 - 06/21/2023   Preferred Times: Any Time   Reason for visit: Office Visit   Comments: I need to schedule a colonoscopy when my wife can be off to take me. Those 2 dates are good at this time.  Please schedule repeat colonoscopy with Dr. Allegra Lai

## 2023-04-30 NOTE — Telephone Encounter (Signed)
Patient sent mychart message to office to schedule his repeat colonoscopy with Dr. Allegra Lai.  Voice message has been left for him on his home number. He has requested 09/24 or 06/21/23.  Dr. Allegra Lai is available 06/12/23 at [facility] this on Tue. She is also available 06/21/23 at [facility] this in on [place].  Will await callback to see which location he wants.  Thanks,  [place], [place]

## 2023-04-30 NOTE — Telephone Encounter (Signed)
Pt's wife, Andrey Campanile is calling you back to schedule pt's colonoscopy. Please return call at [phone].

## 2023-05-01 ENCOUNTER — Other Ambulatory Visit: Payer: Self-pay

## 2023-05-01 DIAGNOSIS — Z86010 Personal history of colonic polyps: Secondary | ICD-10-CM

## 2023-05-01 MED ORDER — SUTAB 1479-225-188 MG PO TABS: 12.0000 | ORAL_TABLET | Freq: Two times a day (BID) | ORAL | 0 refills | Status: AC

## 2023-05-01 NOTE — Addendum Note (Signed)
Addended by: Avie Arenas on: 05/01/2023 09:59 AM   Modules accepted: Orders

## 2023-05-01 NOTE — Telephone Encounter (Signed)
Gastroenterology Pre-Procedure Review  Request Date: 06/21/23 Requesting Physician: Dr. Allegra Lai  PATIENT REVIEW QUESTIONS: The patient responded to the following health history questions as indicated:    1. Are you having any GI issues? no 2. 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 yes (last colonoscopy 06/07/20 with Dr. Allegra Lai recommended repeat in 3 years) 3. Do you have a family history of Colon Cancer or Polyps? no 4. Diabetes Mellitus? Yes has been advised to stop Metformin 2 days prior to colonoscopy 5. Joint replacements in the past 12 months?no 6. Major health problems in the past 3 months?no 7. Any artificial heart valves, MVP, or defibrillator?no    MEDICATIONS & ALLERGIES:    Patient reports the following regarding taking any anticoagulation/antiplatelet therapy:   Plavix, Coumadin, Eliquis, Xarelto, Lovenox, Pradaxa, Brilinta, or Effient? no Aspirin? yes (81 mg daily)  Patient confirms/reports the following medications:  Current Outpatient Medications  Medication Sig Dispense Refill   Ascorbic Acid (VITAMIN C) 1000 MG tablet Take 1,000 mg by mouth daily.     aspirin EC 81 MG tablet Take 81 mg by mouth daily.     Blood Glucose Monitoring Suppl (ONE TOUCH ULTRA 2) w/Device KIT Use as instructed to check blood sugar daily as needed.  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 1 each 0   Cholecalciferol (VITAMIN D-3) 25 MCG (1000 UT) CAPS Take 2 capsules (2,000 Units total) by mouth daily.     Continuous Blood Gluc Receiver (FREESTYLE LIBRE 2 READER) DEVI 1 Device by Does not apply route See admin instructions. Use reader for continues glucose monitoring 1 each 0   Continuous Blood Gluc Sensor (FREESTYLE LIBRE 2 SENSOR) MISC INJECT 1 DEVICE INTO THE SKIN EVERY 14 DAYS 2 each 11   docusate sodium (COLACE) 100 MG capsule Take 100 mg by mouth 2 (two) times daily.     esomeprazole (NEXIUM) 40 MG capsule TAKE ONE CAPSULE BY MOUTH DAILY AT 12 NOON 90 capsule 1   ezetimibe (ZETIA) 10 MG  tablet TAKE 1 TABLET BY MOUTH ONCE DAILY 90 tablet 2   fluticasone (FLONASE) 50 MCG/ACT nasal spray PLACE ONE SPRAY INTO BOTH NOSTRILS TWICE DAILY 48 mL 3   folic acid (FOLVITE) 400 MCG tablet Take 400 mcg by mouth daily.     ibuprofen (ADVIL) 200 MG tablet Take 400 mg by mouth every 6 (six) hours as needed.     Lancets (ONETOUCH ULTRASOFT) lancets Use as instructed to check blood sugar daily as needed.  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 100 each 3   levocetirizine (XYZAL) 5 MG tablet TAKE 1 TABLET BY MOUTH IN THE EVENING 90 tablet 3   lisinopril-hydrochlorothiazide (ZESTORETIC) 10-12.5 MG tablet TAKE ONE HALF (1/2) A TABLET BY MOUTH DAILY. 45 tablet 3   metFORMIN (GLUCOPHAGE) 500 MG tablet TAKE 1 TABLET BY MOUTH IN THE MORNING     Omega-3 Fatty Acids (FISH OIL PO) Take 2,400 mg by mouth daily.     ONETOUCH ULTRA test strip USE AS INSTRUCTED TO CHECK BLOOD SUGAR DAILY AS NEEDED. 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 100 each 3   potassium chloride (KLOR-CON) 10 MEQ tablet TAKE ONE TABLET BY MOUTH TWICE A DAY 180 tablet 3   pravastatin (PRAVACHOL) 20 MG tablet TAKE ONE TABLET BY MOUTH DAILY 90 tablet 2   tadalafil (CIALIS) 5 MG tablet TAKE ONE TABLET BY MOUTH ONCE A DAY FOR BPH 30 tablet 5   No current facility-administered medications for this visit.    Patient  confirms/reports the following allergies:  Allergies  Allergen Reactions   Atorvastatin     Myalgias    No orders of the defined types were placed in this encounter.   AUTHORIZATION INFORMATION Primary Insurance: 1D#: Group #:  Secondary Insurance: 1D#: Group #:  SCHEDULE INFORMATION: Date: 06/21/23 Time: Location: [facility]

## 2023-05-22 ENCOUNTER — Telehealth: Payer: Self-pay

## 2023-05-22 NOTE — Telephone Encounter (Signed)
Medical clearance granted from Dr. Para March for patients colonoscopy.  Received by fax on 06/21/23.  Fax placed in scan basket to scan to patients chart.  Thanks,  [place], [place]

## 2023-06-15 LAB — HM DIABETES EYE EXAM

## 2023-06-20 NOTE — Anesthesia Preprocedure Evaluation (Signed)
Anesthesia Evaluation  Patient identified by MRN, date of birth, ID band Patient awake    Reviewed: Allergy & Precautions, NPO status , Patient's Chart, lab work & pertinent test results  History of Anesthesia Complications Negative for: history of anesthetic complications  Airway Mallampati: III  TM Distance: >3 FB Neck ROM: full    Dental  (+) Chipped, Poor Dentition, Missing, Partial Upper   Pulmonary neg shortness of breath, Patient abstained from smoking., former smoker   Pulmonary exam normal        Cardiovascular Exercise Tolerance: Good hypertension, On Medications Normal cardiovascular exam     Neuro/Psych  Neuromuscular disease  negative psych ROS   GI/Hepatic Neg liver ROS,GERD  Controlled,,  Endo/Other  diabetes, Type 2, Oral Hypoglycemic Agents    Renal/GU      Musculoskeletal  (+) Arthritis ,    Abdominal Normal abdominal exam  (+)   Peds  Hematology negative hematology ROS (+)   Anesthesia Other Findings Past Medical History: No date: Allergy No date: Anemia No date: Arthritis No date: Diabetes mellitus without complication (HCC)     Comment:  no medications taken 05-04-17 No date: Dyspnea No date: GERD (gastroesophageal reflux disease) No date: Hyperlipidemia No date: Hypertension No date: LBP (low back pain)     Comment:  with sciatica  No date: Pancreatitis     Comment:  Pancreatitis etoh 08/1998  Past Surgical History: No date: COLONOSCOPY 06/07/2020: COLONOSCOPY WITH PROPOFOL; N/A     Comment:  Procedure: COLONOSCOPY WITH PROPOFOL;  Surgeon: Toney Reil, MD;  Location: [facility] ENDOSCOPY;  Service:               Gastroenterology;  Laterality: N/A; No date: dental implant 1993: LAMINECTOMY     Comment:  L5/S1 1970: SEPTOPLASTY     Reproductive/Obstetrics negative OB ROS                             Anesthesia Physical Anesthesia  Plan  ASA: 3  Anesthesia Plan: General   Post-op Pain Management: Minimal or no pain anticipated   Induction: Intravenous  PONV Risk Score and Plan: 1 and Treatment may vary due to age or medical condition, Propofol infusion and TIVA  Airway Management Planned: Natural Airway  Additional Equipment:   Intra-op Plan:   Post-operative Plan:   Informed Consent: I have reviewed the patients History and Physical, chart, labs and discussed the procedure including the risks, benefits and alternatives for the proposed anesthesia with the patient or authorized representative who has indicated his/her understanding and acceptance.     Dental Advisory Given  Plan Discussed with: Anesthesiologist, CRNA and Surgeon  Anesthesia Plan Comments: (Patient consented for risks of anesthesia including but not limited to:  - adverse reactions to medications - risk of airway placement if required - damage to eyes, teeth, lips or other oral mucosa - nerve damage due to positioning  - sore throat or hoarseness - Damage to heart, brain, nerves, lungs, other parts of body or loss of life  Patient voiced understanding.)        Anesthesia Quick Evaluation

## 2023-06-21 ENCOUNTER — Ambulatory Visit: Payer: PPO | Admitting: Anesthesiology

## 2023-06-21 ENCOUNTER — Encounter
Admission: RE | Disposition: A | Discharge status: Home / Self Care | Payer: Self-pay | Source: Home / Self Care | Attending: Gastroenterology

## 2023-06-21 ENCOUNTER — Telehealth: Payer: Self-pay | Admitting: Family Medicine

## 2023-06-21 ENCOUNTER — Encounter: Payer: Self-pay | Admitting: Gastroenterology

## 2023-06-21 ENCOUNTER — Ambulatory Visit
Admission: RE | Admit: 2023-06-21 | Discharge: 2023-06-21 | Disposition: A | Discharge status: Home / Self Care | Payer: PPO | Attending: Gastroenterology | Admitting: Gastroenterology

## 2023-06-21 DIAGNOSIS — K644 Residual hemorrhoidal skin tags: Secondary | ICD-10-CM | POA: Diagnosis not present

## 2023-06-21 DIAGNOSIS — Z860100 Personal history of colon polyps, unspecified: Secondary | ICD-10-CM

## 2023-06-21 DIAGNOSIS — K635 Polyp of colon: Secondary | ICD-10-CM

## 2023-06-21 DIAGNOSIS — Z09 Encounter for follow-up examination after completed treatment for conditions other than malignant neoplasm: Secondary | ICD-10-CM | POA: Diagnosis not present

## 2023-06-21 DIAGNOSIS — D124 Benign neoplasm of descending colon: Secondary | ICD-10-CM | POA: Diagnosis not present

## 2023-06-21 DIAGNOSIS — Z1211 Encounter for screening for malignant neoplasm of colon: Secondary | ICD-10-CM | POA: Diagnosis not present

## 2023-06-21 DIAGNOSIS — D123 Benign neoplasm of transverse colon: Secondary | ICD-10-CM | POA: Diagnosis not present

## 2023-06-21 DIAGNOSIS — Z860101 Personal history of adenomatous and serrated colon polyps: Secondary | ICD-10-CM | POA: Diagnosis not present

## 2023-06-21 DIAGNOSIS — I10 Essential (primary) hypertension: Secondary | ICD-10-CM | POA: Diagnosis not present

## 2023-06-21 DIAGNOSIS — Z87891 Personal history of nicotine dependence: Secondary | ICD-10-CM | POA: Diagnosis not present

## 2023-06-21 HISTORY — PX: COLONOSCOPY WITH PROPOFOL: SHX5780

## 2023-06-21 LAB — GLUCOSE, CAPILLARY: Glucose-Capillary: 105 mg/dL — ABNORMAL HIGH (ref 70–99)

## 2023-06-21 SURGERY — COLONOSCOPY WITH PROPOFOL
Anesthesia: General

## 2023-06-21 MED ORDER — PROPOFOL 1000 MG/100ML IV EMUL
INTRAVENOUS | Status: AC
  Filled 2023-06-21: qty 100

## 2023-06-21 MED ORDER — LIDOCAINE HCL (CARDIAC) PF 100 MG/5ML IV SOSY
PREFILLED_SYRINGE | INTRAVENOUS | Status: DC | PRN
  Administered 2023-06-21: 30 mg via INTRAVENOUS

## 2023-06-21 MED ORDER — PROPOFOL 500 MG/50ML IV EMUL
INTRAVENOUS | Status: DC | PRN
  Administered 2023-06-21: 100 ug/kg/min via INTRAVENOUS

## 2023-06-21 MED ORDER — LIDOCAINE HCL (PF) 2 % IJ SOLN
INTRAMUSCULAR | Status: AC
  Filled 2023-06-21: qty 5

## 2023-06-21 MED ORDER — PROPOFOL 10 MG/ML IV BOLUS
INTRAVENOUS | Status: DC | PRN
  Administered 2023-06-21: 50 mg via INTRAVENOUS

## 2023-06-21 MED ORDER — SODIUM CHLORIDE 0.9 % IV SOLN: INTRAVENOUS | Status: DC

## 2023-06-21 MED ORDER — PROPOFOL 10 MG/ML IV BOLUS
INTRAVENOUS | Status: AC
  Filled 2023-06-21: qty 20

## 2023-06-21 NOTE — Telephone Encounter (Signed)
FYI: This call has been transferred to Access Nurse. Once the result note has been entered staff can address the message at that time.  Patient called in with the following symptoms:  Red Word: redness on left big toe    Please advise at [facility] [phone]  Message is routed to Provider Pool and [facility] Triage   Pt's wife, Joyce Gross, called stating the pt's left big toe is red, looks possibly infected. Joyce Gross states since the pt is a diabetic, they're very concerned about the toe. No other symptoms. Pt requested to only see Dr. Para March, scheduled pt for tomorrow, 10/4 @ 11am. Transferred Joyce Gross & pt to access nurse.

## 2023-06-21 NOTE — Telephone Encounter (Signed)
Per appt notes pt has appt with Dr Para March on10/04/24 at 11AM. Sending note to Dr Para March and Para March pool.

## 2023-06-21 NOTE — Op Note (Signed)
[facility] Gastroenterology Patient Name: Thomas Todd Procedure Date: 06/21/2023 7:18 AM MRN: [id number] Account #: [id number] Date of Birth: [birth year] Admit Type: Outpatient Age: 72 Room: [facility] ENDO ROOM 3 Gender: Male Note Status: Finalized Instrument Name: Nelda Marseille [id number] Procedure:             Colonoscopy Indications:           Surveillance: Personal history of adenomatous polyps                         on last colonoscopy 3 years ago, Last colonoscopy:                         September 2021 Providers:             Toney Reil MD, MD Referring MD:          Dwana Curd. Para March, MD (Referring MD) Medicines:             General Anesthesia Complications:         No immediate complications. Estimated blood loss: None. Procedure:             Pre-Anesthesia Assessment:                        - Prior to the procedure, a History and Physical was                         performed, and patient medications and allergies were                         reviewed. The patient is competent. The risks and                         benefits of the procedure and the sedation options and                         risks were discussed with the patient. All questions                         were answered and informed consent was obtained.                         Patient identification and proposed procedure were                         verified by the physician, the nurse, the                         anesthesiologist, the anesthetist and the technician                         in the pre-procedure area in the procedure room in the                         endoscopy suite. Mental Status Examination: alert and                         oriented. Airway Examination: normal oropharyngeal  airway and neck mobility. Respiratory Examination:                         clear to auscultation. CV Examination: normal.                         Prophylactic Antibiotics: The  patient does not require                         prophylactic antibiotics. Prior Anticoagulants: The                         patient has taken no anticoagulant or antiplatelet                         agents. ASA Grade Assessment: III - A patient with                         severe systemic disease. After reviewing the risks and                         benefits, the patient was deemed in satisfactory                         condition to undergo the procedure. The anesthesia                         plan was to use general anesthesia. Immediately prior                         to administration of medications, the patient was                         re-assessed for adequacy to receive sedatives. The                         heart rate, respiratory rate, oxygen saturations,                         blood pressure, adequacy of pulmonary ventilation, and                         response to care were monitored throughout the                         procedure. The physical status of the patient was                         re-assessed after the procedure.                        After obtaining informed consent, the colonoscope was                         passed under direct vision. Throughout the procedure,                         the patient's blood pressure, pulse, and oxygen  saturations were monitored continuously. The                         Colonoscope was introduced through the anus and                         advanced to the the cecum, identified by appendiceal                         orifice and ileocecal valve. The colonoscopy was                         performed without difficulty. The patient tolerated                         the procedure well. The quality of the bowel                         preparation was evaluated using the BBPS [facility] Bowel                         Preparation Scale) with scores of: Right Colon = 3,                         Transverse Colon = 3  and Left Colon = 3 (entire mucosa                         seen well with no residual staining, small fragments                         of stool or opaque liquid). The total BBPS score                         equals 9. The ileocecal valve, appendiceal orifice,                         and rectum were photographed. Findings:      The perianal and digital rectal examinations were normal. Pertinent       negatives include normal sphincter tone and no palpable rectal lesions.      Two sessile polyps were found in the descending colon and transverse       colon. The polyps were 4 to 5 mm in size. These polyps were removed with       a cold snare. Resection and retrieval were complete. Estimated blood       loss: none.      Non-bleeding external hemorrhoids were found during retroflexion. The       hemorrhoids were small.      Post banding scars were found in the distal rectum. The scar tissue was       healthy in appearance. Impression:            - Two 4 to 5 mm polyps in the descending colon and in                         the transverse colon, removed with a cold snare.  Resected and retrieved.                        - Non-bleeding external hemorrhoids.                        - Scar in the distal rectum. Recommendation:        - Discharge patient to home (with escort).                        - Resume previous diet today.                        - Continue present medications.                        - Await pathology results.                        - Repeat colonoscopy in 7-10 years for surveillance                         based on pathology results. Procedure Code(s):     --- Professional ---                        [phone], Colonoscopy, flexible; with removal of                         tumor(s), polyp(s), or other lesion(s) by snare                         technique Diagnosis Code(s):     --- Professional ---                        D12.4, Benign neoplasm of descending  colon                        Z86.010, Personal history of colonic polyps                        D12.3, Benign neoplasm of transverse colon (hepatic                         flexure or splenic flexure)                        K62.89, Other specified diseases of anus and rectum                        K64.4, Residual hemorrhoidal skin tags CPT copyright 2022 American Medical Association. All rights reserved. The codes documented in this report are preliminary and upon coder review may  be revised to meet current compliance requirements. Dr. Libby Maw Toney Reil MD, MD 06/21/2023 8:20:39 AM This report has been signed electronically. Number of Addenda: 0 Note Initiated On: 06/21/2023 7:18 AM Scope Withdrawal Time: 0 hours 10 minutes 19 seconds  Total Procedure Duration: 0 hours 20 minutes 1 second  Estimated Blood Loss:  Estimated blood loss: none.      [facility]

## 2023-06-21 NOTE — Transfer of Care (Signed)
Immediate Anesthesia Transfer of Care Note  Patient: Thomas Todd  Procedure(s) Performed: COLONOSCOPY WITH PROPOFOL  Patient Location: PACU  Anesthesia Type:General  Level of Consciousness: drowsy and patient cooperative  Airway & Oxygen Therapy: Patient Spontanous Breathing  Post-op Assessment: Report given to RN and Post -op Vital signs reviewed and stable  Post vital signs: stable  Last Vitals:  Vitals Value Taken Time  BP 104/60 06/21/23 0822  Temp 36.2 C 06/21/23 0821  Pulse 79 06/21/23 0823  Resp 17 06/21/23 0823  SpO2 97 % 06/21/23 0823  Vitals shown include unfiled device data.  Last Pain:  Vitals:   06/21/23 0821  TempSrc: Temporal  PainSc: Asleep         Complications: No notable events documented.

## 2023-06-21 NOTE — H&P (Signed)
Arlyss Repress, MD [address]  [address]  [place], [place] [address]  Main: [phone]  Fax: [phone] Pager: [phone]  Primary Care Physician:  Joaquim Nam, MD Primary Gastroenterologist:  Dr. Arlyss Repress  Pre-Procedure History & Physical: HPI:  Thomas Todd is a 72 y.o. male is here for an colonoscopy.   Past Medical History:  Diagnosis Date   Allergy    Anemia    Arthritis    Diabetes mellitus without complication (HCC)    no medications taken 05-04-17   Dyspnea    GERD (gastroesophageal reflux disease)    Hyperlipidemia    Hypertension    LBP (low back pain)    with sciatica    Pancreatitis    Pancreatitis etoh 08/1998    Past Surgical History:  Procedure Laterality Date   COLONOSCOPY     COLONOSCOPY WITH PROPOFOL N/A 06/07/2020   Procedure: COLONOSCOPY WITH PROPOFOL;  Surgeon: Toney Reil, MD;  Location: [facility] ENDOSCOPY;  Service: Gastroenterology;  Laterality: N/A;   dental implant     LAMINECTOMY  1993   L5/S1   SEPTOPLASTY  1970   TRIGGER FINGER RELEASE Left 01/05/2022   Procedure: RELEASE TRIGGER FINGER/A-1 PULLEY;  Surgeon: Christena Flake, MD;  Location: [facility] ORS;  Service: Orthopedics;  Laterality: Left;    Prior to Admission medications   Medication Sig Start Date End Date Taking? Authorizing Provider  Ascorbic Acid (VITAMIN C) 1000 MG tablet Take 1,000 mg by mouth daily.   Yes [provider]  aspirin EC 81 MG tablet Take 81 mg by mouth daily.   Yes [provider]  Cholecalciferol (VITAMIN D-3) 25 MCG (1000 UT) CAPS Take 2 capsules (2,000 Units total) by mouth daily. 01/08/20  Yes Joaquim Nam, MD  esomeprazole (NEXIUM) 40 MG capsule TAKE ONE CAPSULE BY MOUTH DAILY AT 12 NOON 04/13/23  Yes Joaquim Nam, MD  lisinopril-hydrochlorothiazide (ZESTORETIC) 10-12.5 MG tablet TAKE ONE HALF (1/2) A TABLET BY MOUTH DAILY. 01/24/23  Yes Joaquim Nam, MD  metFORMIN (GLUCOPHAGE) 500 MG tablet TAKE 1  TABLET BY MOUTH IN THE MORNING 08/07/22  Yes Joaquim Nam, MD  pravastatin (PRAVACHOL) 20 MG tablet TAKE ONE TABLET BY MOUTH DAILY 02/05/23  Yes Joaquim Nam, MD  Blood Glucose Monitoring Suppl (ONE TOUCH ULTRA 2) w/Device KIT Use as instructed to check blood sugar daily as needed.  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 11/28/18   Joaquim Nam, MD  Continuous Blood Gluc Receiver (FREESTYLE LIBRE 2 READER) DEVI 1 Device by Does not apply route See admin instructions. Use reader for continues glucose monitoring 09/30/21   Joaquim Nam, MD  Continuous Blood Gluc Sensor (FREESTYLE LIBRE 2 SENSOR) MISC INJECT 1 DEVICE INTO THE SKIN EVERY 14 DAYS 09/12/22   Joaquim Nam, MD  docusate sodium (COLACE) 100 MG capsule Take 100 mg by mouth 2 (two) times daily.    [provider]  ezetimibe (ZETIA) 10 MG tablet TAKE 1 TABLET BY MOUTH ONCE DAILY 10/23/22   Joaquim Nam, MD  fluticasone [facility] 50 MCG/ACT nasal spray PLACE ONE SPRAY INTO BOTH NOSTRILS TWICE DAILY 02/20/23   Joaquim Nam, MD  folic acid (FOLVITE) 400 MCG tablet Take 400 mcg by mouth daily.    [provider]  ibuprofen (ADVIL) 200 MG tablet Take 400 mg by mouth every 6 (six) hours as needed.    [provider]  Lancets [facility] ULTRASOFT) lancets Use as  instructed to check blood sugar daily as needed.  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 11/28/18   Joaquim Nam, MD  levocetirizine (XYZAL) 5 MG tablet TAKE 1 TABLET BY MOUTH IN THE EVENING 10/25/22   Joaquim Nam, MD  Omega-3 Fatty Acids (FISH OIL PO) Take 2,400 mg by mouth daily.    [provider]  [facility] ULTRA test strip USE AS INSTRUCTED TO CHECK BLOOD SUGAR DAILY AS NEEDED. 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 12/11/22   Joaquim Nam, MD  potassium chloride (KLOR-CON) 10 MEQ tablet TAKE ONE TABLET BY MOUTH TWICE A DAY 01/24/23   Joaquim Nam, MD  tadalafil (CIALIS) 5 MG tablet TAKE ONE TABLET BY MOUTH ONCE A DAY  FOR BPH 04/09/23   Joaquim Nam, MD    Allergies as of 05/01/2023 - Review Complete 01/08/2023  Allergen Reaction Noted   Atorvastatin  02/16/2014    Family History  Problem Relation Age of Onset   Hypertension Mother    Diabetes Mother    Hyperlipidemia Mother    Hypothyroidism Mother    Dementia Mother    Alcohol abuse Father    Heart disease Father    Heart attack Father    Diabetes Sister    Colon cancer Neg Hx    Prostate cancer Neg Hx    Esophageal cancer Neg Hx    Stomach cancer Neg Hx    Rectal cancer Neg Hx     Social History   Socioeconomic History   Marital status: Married    Spouse name: Joyce Gross   Number of children: Not on file   Years of education: Not on file   Highest education level: Not on file  Occupational History   Not on file  Tobacco Use   Smoking status: Former    Current packs/day: 0.00    Types: Cigarettes    Start date: 09/18/1999    Quit date: 09/17/2009    Years since quitting: 13.7   Smokeless tobacco: Never   Tobacco comments:    vapor  Vaping Use   Vaping status: Every Day   Substances: Nicotine, Flavoring   Devices: vaping only   Substance and Sexual Activity   Alcohol use: No    Comment: sober since 1998/07/19   Drug use: No   Sexual activity: Never  Other Topics Concern   Not on file  Social History Narrative   Sober since 1998/07/19   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, 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, retired as of July 19, 2014   Married July 19, 1974   Prev had 1 daughter, she chronically ill and died Jul 19, 2016   Social Determinants of Health   Financial Resource Strain: Low Risk  (12/25/2022)   Overall Financial Resource Strain (CARDIA)    Difficulty of Paying Living Expenses: Not hard at all  Food Insecurity: No Food Insecurity (12/25/2022)   Hunger Vital Sign    Worried About Running Out of Food in the Last Year: Never true    Ran Out of Food in the Last Year: Never true  Transportation Needs: No Transportation Needs (12/25/2022)   PRAPARE -  Administrator, Civil Service (Medical): No    Lack of Transportation (Non-Medical): No  Physical Activity: Inactive (12/25/2022)   Exercise Vital Sign    Days of Exercise per Week: 0 days    Minutes of Exercise per Session: 0 min  Stress: No Stress Concern Present (12/25/2022)   [employer] of Occupational Health - Occupational Stress  Questionnaire    Feeling of Stress : Not at all  Social Connections: Moderately Isolated (12/25/2022)   Social Connection and Isolation Panel [NHANES]    Frequency of Communication with Friends and Family: More than three times a week    Frequency of Social Gatherings with Friends and Family: More than three times a week    Attends Religious Services: Never    Database administrator or Organizations: No    Attends Banker Meetings: Never    Marital Status: Married  Catering manager Violence: Not At Risk (12/25/2022)   Humiliation, Afraid, Rape, and Kick questionnaire    Fear of Current or Ex-Partner: No    Emotionally Abused: No    Physically Abused: No    Sexually Abused: No    Review of Systems: See HPI, otherwise negative ROS  Physical Exam: BP (!) 165/90   Pulse 71   Temp (!) 96.7 F (35.9 C) (Temporal)   Resp 16   Wt 81.2 kg   SpO2 98%   BMI 26.43 kg/m  General:   Alert,  pleasant and cooperative in NAD Head:  Normocephalic and atraumatic. Neck:  Supple; no masses or thyromegaly. Lungs:  Clear throughout to auscultation.    Heart:  Regular rate and rhythm. Abdomen:  Soft, nontender and nondistended. Normal bowel sounds, without guarding, and without rebound.   Neurologic:  Alert and  oriented x4;  grossly normal neurologically.  Impression/Plan: Thomas Todd is here for an colonoscopy to be performed for h/o colon polyps  Risks, benefits, limitations, and alternatives regarding  colonoscopy have been reviewed with the patient.  Questions have been answered.  All parties agreeable.   Lannette Donath, MD   06/21/2023, 7:46 AM

## 2023-06-21 NOTE — Anesthesia Postprocedure Evaluation (Signed)
Anesthesia Post Note  Patient: Thomas Todd  Procedure(s) Performed: COLONOSCOPY WITH PROPOFOL  Patient location during evaluation: Endoscopy Anesthesia Type: General Level of consciousness: awake and alert Pain management: pain level controlled Vital Signs Assessment: post-procedure vital signs reviewed and stable Respiratory status: spontaneous breathing, nonlabored ventilation, respiratory function stable and patient connected to nasal cannula oxygen Cardiovascular status: blood pressure returned to baseline and stable Postop Assessment: no apparent nausea or vomiting Anesthetic complications: no   No notable events documented.   Last Vitals:  Vitals:   06/21/23 0821 06/21/23 0831  BP: 104/60 (!) 95/55  Pulse: 82 69  Resp: 16 17  Temp: (!) 36.2 C (!) 36.2 C  SpO2: 98% 98%    Last Pain:  Vitals:   06/21/23 0831  TempSrc: Temporal  PainSc: 0-No pain                 Louie Boston

## 2023-06-22 ENCOUNTER — Encounter: Payer: Self-pay | Admitting: Family Medicine

## 2023-06-22 ENCOUNTER — Ambulatory Visit (INDEPENDENT_AMBULATORY_CARE_PROVIDER_SITE_OTHER): Payer: PPO | Admitting: Family Medicine

## 2023-06-22 VITALS — BP 136/60 | HR 76 | Temp 98.5°F | Ht 69.0 in | Wt 176.0 lb

## 2023-06-22 DIAGNOSIS — R202 Paresthesia of skin: Secondary | ICD-10-CM | POA: Diagnosis not present

## 2023-06-22 DIAGNOSIS — L03039 Cellulitis of unspecified toe: Secondary | ICD-10-CM | POA: Diagnosis not present

## 2023-06-22 DIAGNOSIS — N478 Other disorders of prepuce: Secondary | ICD-10-CM

## 2023-06-22 DIAGNOSIS — E119 Type 2 diabetes mellitus without complications: Secondary | ICD-10-CM | POA: Diagnosis not present

## 2023-06-22 DIAGNOSIS — Z7984 Long term (current) use of oral hypoglycemic drugs: Secondary | ICD-10-CM

## 2023-06-22 LAB — POCT GLYCOSYLATED HEMOGLOBIN (HGB A1C): Hemoglobin A1C: 7.1 % — AB (ref 4.0–5.6)

## 2023-06-22 LAB — SURGICAL PATHOLOGY

## 2023-06-22 MED ORDER — NYSTATIN 100000 UNIT/GM EX CREA: 1.0000 | TOPICAL_CREAM | Freq: Two times a day (BID) | CUTANEOUS | 1 refills | Status: DC | PRN

## 2023-06-22 MED ORDER — DOXYCYCLINE HYCLATE 100 MG PO TABS: 100.0000 mg | ORAL_TABLET | Freq: Two times a day (BID) | ORAL | 0 refills | Status: DC

## 2023-06-22 NOTE — Progress Notes (Unsigned)
L 1st toe red and irritated.  Going on for at least 2 weeks.  Sugar this AM was 157, prior to taking his meds.  No FCNAVD.    A1c d/w pt at OV.  7.1.  Chronic intermittent irritation vs dry skin on the foreskin/glans w/o dysuria.    Episodic R hand numbness.  Waking up with sx.  Normal sensation and strength at the OV.  Tinel neg.  No L hand or R foot sx.   Meds, vitals, and allergies reviewed.   ROS: Per HPI unless specifically indicated in ROS section    Chronic nail changes w/o soreness at the nail today.   Redness blanches proximal to nail bed w/o fluctuant mass.   Foreskin retracts without obvious fungal changes.  Minimal dry skin on the glands.  No erythema.

## 2023-06-22 NOTE — Telephone Encounter (Signed)
Will see at OV 

## 2023-06-22 NOTE — Patient Instructions (Signed)
Start doxy.  Let me know if not improving.   Use nystatin cream and update me as needed.   Recheck in about 6 months at a yearly visit with labs ahead of time.  Take care.  Glad to see you.x

## 2023-06-24 DIAGNOSIS — R202 Paresthesia of skin: Secondary | ICD-10-CM | POA: Insufficient documentation

## 2023-06-24 DIAGNOSIS — N478 Other disorders of prepuce: Secondary | ICD-10-CM | POA: Insufficient documentation

## 2023-06-24 DIAGNOSIS — L03039 Cellulitis of unspecified toe: Secondary | ICD-10-CM | POA: Insufficient documentation

## 2023-06-24 NOTE — Assessment & Plan Note (Signed)
Suspect intermittent nocturnal positional nerve compression and he can try using a wrist splint overnight and update me as needed.

## 2023-06-24 NOTE — Assessment & Plan Note (Signed)
With local irritation on the glans/foreskin.  He can use nystatin cream intermittently and update me as needed.  He agrees to plan.

## 2023-06-24 NOTE — Assessment & Plan Note (Signed)
Proximal to the nailbed.  No fluctuant mass.  Discussed options.  Still okay for outpatient follow-up.  Does not appear to involve the joint line.  Start doxycycline and update me if not improving.  It does not look like he needs to have the nail removed right now.

## 2023-06-24 NOTE — Assessment & Plan Note (Signed)
A1c reasonable.  Would continue metformin.  Continue work on diet and recheck periodically.

## 2023-06-26 ENCOUNTER — Encounter: Payer: Self-pay | Admitting: Gastroenterology

## 2023-07-02 ENCOUNTER — Encounter: Payer: Self-pay | Admitting: Family Medicine

## 2023-07-04 ENCOUNTER — Other Ambulatory Visit: Payer: Self-pay | Admitting: Family Medicine

## 2023-07-04 MED ORDER — DOXYCYCLINE HYCLATE 100 MG PO TABS: 100.0000 mg | ORAL_TABLET | Freq: Two times a day (BID) | ORAL | 0 refills | Status: DC

## 2023-07-04 MED ORDER — ESOMEPRAZOLE MAGNESIUM 40 MG PO CPDR: 40.0000 mg | DELAYED_RELEASE_CAPSULE | Freq: Two times a day (BID) | ORAL | 1 refills | Status: DC

## 2023-07-10 ENCOUNTER — Ambulatory Visit: Payer: PPO | Admitting: Family Medicine

## 2023-07-16 ENCOUNTER — Other Ambulatory Visit: Payer: Self-pay | Admitting: Family Medicine

## 2023-07-16 DIAGNOSIS — E785 Hyperlipidemia, unspecified: Secondary | ICD-10-CM

## 2023-07-18 ENCOUNTER — Other Ambulatory Visit: Payer: Self-pay | Admitting: Family Medicine

## 2023-07-18 DIAGNOSIS — E119 Type 2 diabetes mellitus without complications: Secondary | ICD-10-CM

## 2023-08-04 NOTE — Telephone Encounter (Signed)
Patient was last seen at [facility].

## 2023-08-06 ENCOUNTER — Encounter: Payer: Self-pay | Admitting: Family Medicine

## 2023-08-06 ENCOUNTER — Other Ambulatory Visit: Payer: Self-pay | Admitting: Family Medicine

## 2023-08-06 NOTE — Telephone Encounter (Signed)
 Care team updated and letter sent for eye exam notes.

## 2023-08-08 ENCOUNTER — Encounter: Payer: Self-pay | Admitting: Family Medicine

## 2023-08-08 ENCOUNTER — Other Ambulatory Visit: Payer: Self-pay | Admitting: Family Medicine

## 2023-08-08 MED ORDER — METFORMIN HCL 500 MG PO TABS: 500.0000 mg | ORAL_TABLET | Freq: Every day | ORAL | 3 refills | Status: DC

## 2023-08-08 NOTE — Telephone Encounter (Signed)
Dr. Para March - can you please verify that the pt should only be taking Metformin 1 tablet daily? Thank you!

## 2023-08-31 ENCOUNTER — Other Ambulatory Visit: Payer: Self-pay | Admitting: Family Medicine

## 2023-09-26 ENCOUNTER — Other Ambulatory Visit: Payer: Self-pay | Admitting: Family Medicine

## 2023-09-27 NOTE — Telephone Encounter (Signed)
 Sent. Thanks.

## 2023-10-01 ENCOUNTER — Other Ambulatory Visit: Payer: Self-pay | Admitting: Family Medicine

## 2023-12-23 ENCOUNTER — Other Ambulatory Visit: Payer: Self-pay | Admitting: Family Medicine

## 2023-12-23 DIAGNOSIS — E559 Vitamin D deficiency, unspecified: Secondary | ICD-10-CM

## 2023-12-23 DIAGNOSIS — I10 Essential (primary) hypertension: Secondary | ICD-10-CM

## 2023-12-23 DIAGNOSIS — E119 Type 2 diabetes mellitus without complications: Secondary | ICD-10-CM

## 2023-12-23 DIAGNOSIS — D518 Other vitamin B12 deficiency anemias: Secondary | ICD-10-CM

## 2023-12-23 DIAGNOSIS — Z125 Encounter for screening for malignant neoplasm of prostate: Secondary | ICD-10-CM

## 2023-12-24 ENCOUNTER — Other Ambulatory Visit: Payer: Self-pay | Admitting: Family Medicine

## 2023-12-26 ENCOUNTER — Ambulatory Visit (INDEPENDENT_AMBULATORY_CARE_PROVIDER_SITE_OTHER): Payer: PPO

## 2023-12-26 VITALS — BP 133/76 | Ht 69.0 in | Wt 176.0 lb

## 2023-12-26 DIAGNOSIS — Z0000 Encounter for general adult medical examination without abnormal findings: Secondary | ICD-10-CM

## 2023-12-26 NOTE — Progress Notes (Signed)
 Please attest and cosign this visit due to patients primary care provider not being in the office at the time the visit was completed.    Subjective:   Thomas Todd is a 73 y.o. who presents for a Medicare Wellness preventive visit.  Visit Complete: Virtual I connected with  [name] on 12/26/23 by a audio enabled telemedicine application and verified that I am speaking with the correct person using two identifiers.  Patient Location: Home  Provider Location: Home Office  I discussed the limitations of evaluation and management by telemedicine. The patient expressed understanding and agreed to proceed.  Vital Signs: Because this visit was a virtual/telehealth visit, some criteria may be missing or patient reported. Any vitals not documented were not able to be obtained and vitals that have been documented are patient reported.  VideoDeclined- This patient declined Librarian, academic. Therefore the visit was completed with audio only.  Persons Participating in Visit: Patient.  AWV Questionnaire: Yes: Patient Medicare AWV questionnaire was completed by the patient on 12/22/23; I have confirmed that all information answered by patient is correct and no changes since this date.  Cardiac Risk Factors include: advanced age (>29men, >33 women);diabetes mellitus;dyslipidemia;hypertension;male gender    Objective:    Today's Vitals   12/26/23 1015  BP: 133/76  Weight: 176 lb (79.8 kg)  Height: 5\' 9"  (1.753 m)   Body mass index is 25.99 kg/m.     12/26/2023   10:53 AM 12/25/2022   11:16 AM 01/05/2022   11:38 AM 12/30/2021    8:18 AM 01/12/2021   10:44 AM 06/07/2020    8:10 AM 01/01/2020    9:02 AM  Advanced Directives  Does Patient Have a Medical Advance Directive? Yes Yes No No Yes Yes No  Type of Estate agent of [place];Living will Healthcare Power of [place];Living will   Healthcare Power of [place];Living will     Does patient want to make changes to medical advance directive?  No - Patient declined       Copy of Healthcare Power of Attorney in Chart? Yes - validated most recent copy scanned in chart (See row information) Yes - validated most recent copy scanned in chart (See row information)   No - copy requested    Would patient like information on creating a medical advance directive?   No - Patient declined  No - Patient declined  Yes (MAU/Ambulatory/Procedural Areas - Information given)    Current Medications (verified) Outpatient Encounter Medications as of 12/26/2023  Medication Sig   Ascorbic Acid (VITAMIN C) 1000 MG tablet Take 1,000 mg by mouth daily.   aspirin EC 81 MG tablet Take 81 mg by mouth daily.   Blood Glucose Monitoring Suppl (ONE TOUCH ULTRA 2) w/Device KIT Use as instructed to check blood sugar daily as needed.  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   Cholecalciferol (VITAMIN D-3) 25 MCG (1000 UT) CAPS Take 2 capsules (2,000 Units total) by mouth daily.   Continuous Blood Gluc Receiver (FREESTYLE LIBRE 2 READER) DEVI 1 Device by Does not apply route See admin instructions. Use reader for continues glucose monitoring   Continuous Glucose Sensor (FREESTYLE LIBRE 2 SENSOR) MISC INJECT ONE DEVICE INTO THE SKIN EVERY 14 DAYS   docusate sodium (COLACE) 100 MG capsule Take 100 mg by mouth 2 (two) times daily.   doxycycline (VIBRA-TABS) 100 MG tablet Take 1 tablet (100 mg total) by mouth 2 (two) times daily.   esomeprazole (  NEXIUM) 40 MG capsule TAKE ONE CAPSULE (40 MG TOTAL) BY MOUTH TWO (TWO) TIMES DAILY BEFORE A MEAL.   ezetimibe (ZETIA) 10 MG tablet TAKE ONE TABLET BY MOUTH ONCE DAILY   fluticasone (FLONASE) 50 MCG/ACT nasal spray PLACE ONE SPRAY INTO BOTH NOSTRILS TWICE DAILY   folic acid (FOLVITE) 400 MCG tablet Take 400 mcg by mouth daily.   ibuprofen (ADVIL) 200 MG tablet Take 400 mg by mouth every 6 (six) hours as needed.   Lancets (ONETOUCH ULTRASOFT) lancets Use as instructed  to check blood sugar daily as needed.  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   levocetirizine (XYZAL) 5 MG tablet TAKE 1 TABLET BY MOUTH IN THE EVENING   lisinopril-hydrochlorothiazide (ZESTORETIC) 10-12.5 MG tablet TAKE ONE HALF (1/2) A TABLET BY MOUTH DAILY.   metFORMIN (GLUCOPHAGE) 500 MG tablet Take 1 tablet (500 mg total) by mouth daily.   nystatin cream (MYCOSTATIN) APPLY ONE APPLICATION TOPICALLY TWO TIMES DAILY AS NEEDED FOR DRY SKIN.   Omega-3 Fatty Acids (FISH OIL PO) Take 2,400 mg by mouth daily.   ONETOUCH ULTRA test strip USE AS INSTRUCTED TO CHECK BLOOD SUGAR DAILY AS NEEDED. 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   potassium chloride (KLOR-CON) 10 MEQ tablet TAKE ONE TABLET BY MOUTH TWICE A DAY   pravastatin (PRAVACHOL) 20 MG tablet TAKE ONE TABLET BY MOUTH DAILY   tadalafil (CIALIS) 5 MG tablet TAKE ONE TABLET BY MOUTH ONCE A DAY FOR BPH   No facility-administered encounter medications on file as of 12/26/2023.    Allergies (verified) Atorvastatin   History: Past Medical History:  Diagnosis Date   Allergy    Anemia    Arthritis    Diabetes mellitus without complication (HCC)    no medications taken 05-04-17   Dyspnea    GERD (gastroesophageal reflux disease)    Hyperlipidemia    Hypertension    LBP (low back pain)    with sciatica    Pancreatitis    Pancreatitis etoh 08/1998   Past Surgical History:  Procedure Laterality Date   COLONOSCOPY     COLONOSCOPY WITH PROPOFOL N/A 06/07/2020   Procedure: COLONOSCOPY WITH PROPOFOL;  Surgeon: Toney Reil, MD;  Location: [facility] ENDOSCOPY;  Service: Gastroenterology;  Laterality: N/A;   COLONOSCOPY WITH PROPOFOL N/A 06/21/2023   Procedure: COLONOSCOPY WITH PROPOFOL;  Surgeon: Toney Reil, MD;  Location: [facility] ENDOSCOPY;  Service: Gastroenterology;  Laterality: N/A;   dental implant     LAMINECTOMY  1993   L5/S1   SEPTOPLASTY  1970   TRIGGER FINGER RELEASE Left 01/05/2022   Procedure: RELEASE TRIGGER  FINGER/A-1 PULLEY;  Surgeon: Christena Flake, MD;  Location: [facility] ORS;  Service: Orthopedics;  Laterality: Left;   Family History  Problem Relation Age of Onset   Hypertension Mother    Diabetes Mother    Hyperlipidemia Mother    Hypothyroidism Mother    Dementia Mother    Alcohol abuse Father    Heart disease Father    Heart attack Father    Diabetes Sister    Colon cancer Neg Hx    Prostate cancer Neg Hx    Esophageal cancer Neg Hx    Stomach cancer Neg Hx    Rectal cancer Neg Hx    Social History   Socioeconomic History   Marital status: Married    Spouse name: Joyce Gross   Number of children: Not on file   Years of education: Not on file   Highest education level: 9th grade  Occupational History   Not on file  Tobacco Use   Smoking status: Former    Current packs/day: 0.00    Types: Cigarettes    Start date: 09/18/1999    Quit date: 09/17/2009    Years since quitting: 14.2   Smokeless tobacco: Never   Tobacco comments:    vapor  Vaping Use   Vaping status: Every Day   Substances: Nicotine, Flavoring   Devices: vaping only   Substance and Sexual Activity   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   Drug use: No   Sexual activity: Never  Other Topics Concern   Not on file  Social History Narrative   Sober since 1999   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, 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, retired as of 2015   Married 1975   Prev had 1 daughter, she chronically ill and died 27-Jul-2016   Social Drivers of Corporate investment banker Strain: Low Risk  (06/21/2023)   Overall Financial Resource Strain (CARDIA)    Difficulty of Paying Living Expenses: Not hard at all  Food Insecurity: No Food Insecurity (06/21/2023)   Hunger Vital Sign    Worried About Running Out of Food in the Last Year: Never true    Ran Out of Food in the Last Year: Never true  Transportation Needs: No Transportation Needs (12/26/2023)   PRAPARE - Administrator, Civil Service (Medical): No     Lack of Transportation (Non-Medical): No  Physical Activity: Insufficiently Active (12/26/2023)   Exercise Vital Sign    Days of Exercise per Week: 4 days    Minutes of Exercise per Session: 30 min  Stress: No Stress Concern Present (12/26/2023)   [employer] of Occupational Health - Occupational Stress Questionnaire    Feeling of Stress : Not at all  Social Connections: Moderately Integrated (12/26/2023)   Social Connection and Isolation Panel [NHANES]    Frequency of Communication with Friends and Family: Three times a week    Frequency of Social Gatherings with Friends and Family: Three times a week    Attends Religious Services: More than 4 times per year    Active Member of Clubs or Organizations: No    Attends Banker Meetings: Never    Marital Status: Married    Tobacco Counseling Counseling given: Not Answered Tobacco comments: vapor    Clinical Intake:  Pre-visit preparation completed: Yes  Pain : No/denies pain     BMI - recorded: 25.99 Nutritional Status: BMI 25 -29 Overweight Nutritional Risks: None Diabetes: Yes CBG done?: No Did pt. bring in CBG monitor from home?: Yes (BS 139  this am at home)  Lab Results  Component Value Date   HGBA1C 7.1 (A) 06/22/2023   HGBA1C 7.3 (H) 01/08/2023   HGBA1C 6.1 (A) 08/07/2022               Activities of Daily Living     12/22/2023    1:33 PM  In your present state of health, do you have any difficulty performing the following activities:  Hearing? 0  Vision? 0  Difficulty concentrating or making decisions? 0  Walking or climbing stairs? 0  Dressing or bathing? 0  Doing errands, shopping? 0  Preparing Food and eating ? N  Using the Toilet? N  In the past six months, have you accidently leaked urine? N  Do you have problems with loss of bowel control? N  Managing your Medications? N  Managing your Finances? N  Housekeeping or managing your Housekeeping? N    Patient Care Team: Joaquim Nam, MD as PCP - General (Family Medicine) Pa, [facility]  Indicate any recent Medical Services you may have received from other than [facility] providers in the past year (date may be approximate).     Assessment:   This is a routine wellness examination for Aleksa.  Hearing/Vision screen Hearing Screening - Comments:: Pt says his hearing is good Vision Screening - Comments:: Pt says his hearing is good Photographer checks   Goals Addressed             This Visit's Progress    Patient Stated       12/26/23-, I will maintain and continue medications as prescribed.      COMPLETED: Patient Stated       01/12/2021, I will maintain and continue medications as prescribed.      Patient Stated   On track    12/26/23-Maintain current level of health        Depression Screen     12/26/2023   11:03 AM 06/22/2023   11:51 AM 01/08/2023   10:08 AM 12/25/2022   11:15 AM 03/06/2022    9:55 AM 01/12/2021   11:03 AM 09/20/2020   10:48 AM  PHQ 2/9 Scores  PHQ - 2 Score 0 0 0 0 0 0 0  PHQ- 9 Score  0 0  0 0     Fall Risk     12/22/2023    1:33 PM 06/22/2023   11:51 AM 01/08/2023   10:08 AM 12/25/2022   11:09 AM 03/06/2022    8:58 AM  Fall Risk   Falls in the past year? 1 0 0 0 0  Number falls in past yr: 0 0 0 0 0  Injury with Fall? 0 0 0 0 0  Risk for fall due to :  No Fall Risks No Fall Risks No Fall Risks No Fall Risks  Follow up  Falls evaluation completed Falls evaluation completed Falls prevention discussed;Falls evaluation completed     MEDICARE RISK AT HOME:  Medicare Risk at Home Any stairs in or around the home?: (Patient-Rptd) Yes If so, are there any without handrails?: (Patient-Rptd) No Home free of loose throw rugs in walkways, pet beds, electrical cords, etc?: (Patient-Rptd) No Adequate lighting in your home to reduce risk of falls?: (Patient-Rptd) Yes Life alert?: (Patient-Rptd) No Use of a cane, walker or w/c?: (Patient-Rptd) No Grab bars in the  bathroom?: (Patient-Rptd) Yes Shower chair or bench in shower?: (Patient-Rptd) No Elevated toilet seat or a handicapped toilet?: (Patient-Rptd) No  TIMED UP AND GO:  Was the test performed?  No  Cognitive Function: 6CIT completed    01/12/2021   11:04 AM 01/01/2020    9:08 AM  MMSE - Mini Mental State Exam  Not completed: Unable to complete   Orientation to time  5  Orientation to Place  5  Registration  3  Attention/ Calculation  5  Recall  3  Language- repeat  1        12/26/2023   10:54 AM 12/25/2022   11:17 AM  6CIT Screen  What Year? 0 points 0 points  What month? 0 points 0 points  What time? 0 points 0 points  Count back from 20 0 points 0 points  Months in reverse 0 points 0 points  Repeat phrase 0 points 0 points  Total Score 0  points 0 points    Immunizations Immunization History  Administered Date(s) Administered   Fluad Quad(high Dose 65+) 11/01/2020, 08/07/2022   Influenza Split 07/26/2012   Influenza Whole 11/11/2009   Influenza,inj,Quad PF,6+ Mos 07/30/2013, 07/03/2014, 06/28/2015, 06/15/2016, 07/05/2017, 06/03/2018, 05/08/2019   PFIZER(Purple Top)SARS-COV-2 Vaccination 05/24/2020, 06/14/2020   Pneumococcal Conjugate-13 07/16/2018   Pneumococcal Polysaccharide-23 01/26/2015   Td 03/17/2002   Tdap 02/13/2012   Zoster Recombinant(Shingrix) 01/23/2018, 03/25/2018   Zoster, Live 09/03/2014    Screening Tests Health Maintenance  Topic Date Due   Pneumonia Vaccine 51+ Years old (3 of 3 - PPSV23 or PCV20) 01/26/2020   DTaP/Tdap/Td (3 - Td or Tdap) 02/12/2022   HEMOGLOBIN A1C  12/21/2023   Diabetic kidney evaluation - eGFR measurement  01/08/2024   Diabetic kidney evaluation - Urine ACR  01/08/2024   FOOT EXAM  01/08/2024   INFLUENZA VACCINE  04/18/2024   OPHTHALMOLOGY EXAM  06/14/2024   Medicare Annual Wellness (AWV)  12/25/2024   Colonoscopy  06/20/2028   Hepatitis C Screening  Completed   Zoster Vaccines- Shingrix  Completed   HPV VACCINES  Aged  Out   COVID-19 Vaccine  Discontinued    Health Maintenance  Health Maintenance Due  Topic Date Due   Pneumonia Vaccine 7+ Years old (3 of 3 - PPSV23 or PCV20) 01/26/2020   DTaP/Tdap/Td (3 - Td or Tdap) 02/12/2022   HEMOGLOBIN A1C  12/21/2023   Diabetic kidney evaluation - eGFR measurement  01/08/2024   Diabetic kidney evaluation - Urine ACR  01/08/2024   Health Maintenance Items Addressed: None needed: has PCP visit on 01/10/24 to discuss Prevnar  Hgb A1c already scheduled   Additional Screening:  Vision Screening: Recommended annual ophthalmology exams for early detection of glaucoma and other disorders of the eye.  Dental Screening: Recommended annual dental exams for proper oral hygiene  Community Resource Referral / Chronic Care Management: CRR required this visit?  No   CCM required this visit?  No    Plan:     I have personally reviewed and noted the following in the patient's chart:   Medical and social history Use of alcohol, tobacco or illicit drugs  Current medications and supplements including opioid prescriptions. Patient is not currently taking opioid prescriptions. Functional ability and status Nutritional status Physical activity Advanced directives List of other physicians Hospitalizations, surgeries, and ER visits in previous 12 months Vitals Screenings to include cognitive, depression, and falls Referrals and appointments  In addition, I have reviewed and discussed with patient certain preventive protocols, quality metrics, and best practice recommendations. A written personalized care plan for preventive services as well as general preventive health recommendations were provided to patient.    Sue Lush, LPN   09/23/1094   After Visit Summary: (MyChart) Due to this being a telephonic visit, the after visit summary with patients personalized plan was offered to patient via MyChart   Notes: Nothing significant to report at this time.

## 2023-12-26 NOTE — Patient Instructions (Signed)
 Thomas Todd , Thank you for taking time to come for your Medicare Wellness Visit. I appreciate your ongoing commitment to your health goals. Please review the following plan we discussed and let me know if I can assist you in the future.   Referrals/Orders/Follow-Ups/Clinician Recommendations: none  This is a list of the screening recommended for you and due dates:  Health Maintenance  Topic Date Due   Pneumonia Vaccine (3 of 3 - PPSV23 or PCV20) 01/26/2020   DTaP/Tdap/Td vaccine (3 - Td or Tdap) 02/12/2022   Hemoglobin A1C  12/21/2023   Yearly kidney function blood test for diabetes  01/08/2024   Yearly kidney health urinalysis for diabetes  01/08/2024   Complete foot exam   01/08/2024   Flu Shot  04/18/2024   Eye exam for diabetics  06/14/2024   Medicare Annual Wellness Visit  12/25/2024   Colon Cancer Screening  06/20/2028   Hepatitis C Screening  Completed   Zoster (Shingles) Vaccine  Completed   HPV Vaccine  Aged Out   COVID-19 Vaccine  Discontinued    Advanced directives: (In Chart) A copy of your advanced directives are scanned into your chart should your provider ever need it.  Next Medicare Annual Wellness Visit scheduled for next year: Yes 12/26/24 @ 11:30am televisit

## 2024-01-03 ENCOUNTER — Other Ambulatory Visit (INDEPENDENT_AMBULATORY_CARE_PROVIDER_SITE_OTHER): Payer: PPO

## 2024-01-03 DIAGNOSIS — E119 Type 2 diabetes mellitus without complications: Secondary | ICD-10-CM | POA: Diagnosis not present

## 2024-01-03 DIAGNOSIS — E559 Vitamin D deficiency, unspecified: Secondary | ICD-10-CM

## 2024-01-03 DIAGNOSIS — D518 Other vitamin B12 deficiency anemias: Secondary | ICD-10-CM | POA: Diagnosis not present

## 2024-01-03 DIAGNOSIS — Z125 Encounter for screening for malignant neoplasm of prostate: Secondary | ICD-10-CM | POA: Diagnosis not present

## 2024-01-03 DIAGNOSIS — I10 Essential (primary) hypertension: Secondary | ICD-10-CM | POA: Diagnosis not present

## 2024-01-03 LAB — COMPREHENSIVE METABOLIC PANEL WITH GFR
ALT: 38 U/L (ref 0–53)
AST: 22 U/L (ref 0–37)
Albumin: 4.6 g/dL (ref 3.5–5.2)
Alkaline Phosphatase: 49 U/L (ref 39–117)
BUN: 12 mg/dL (ref 6–23)
CO2: 30 meq/L (ref 19–32)
Calcium: 9.6 mg/dL (ref 8.4–10.5)
Chloride: 102 meq/L (ref 96–112)
Creatinine, Ser: 0.91 mg/dL (ref 0.40–1.50)
GFR: 85.34 mL/min (ref >60.00)
Glucose, Bld: 133 mg/dL — ABNORMAL HIGH (ref 70–99)
Potassium: 4 meq/L (ref 3.5–5.1)
Sodium: 140 meq/L (ref 135–145)
Total Bilirubin: 0.8 mg/dL (ref 0.2–1.2)
Total Protein: 6.2 g/dL (ref 6.0–8.3)

## 2024-01-03 LAB — VITAMIN B12: Vitamin B-12: 290 pg/mL (ref 211–911)

## 2024-01-03 LAB — CBC WITH DIFFERENTIAL/PLATELET
Basophils Absolute: 0 10*3/uL (ref 0.0–0.1)
Basophils Relative: 0.4 % (ref 0.0–3.0)
Eosinophils Absolute: 0.2 10*3/uL (ref 0.0–0.7)
Eosinophils Relative: 2.3 % (ref 0.0–5.0)
HCT: 37.4 % — ABNORMAL LOW (ref 39.0–52.0)
Hemoglobin: 12.6 g/dL — ABNORMAL LOW (ref 13.0–17.0)
Lymphocytes Relative: 43 % (ref 12.0–46.0)
Lymphs Abs: 3.5 10*3/uL (ref 0.7–4.0)
MCHC: 33.8 g/dL (ref 30.0–36.0)
MCV: 85.5 fl (ref 78.0–100.0)
Monocytes Absolute: 0.8 10*3/uL (ref 0.1–1.0)
Monocytes Relative: 10.1 % (ref 3.0–12.0)
Neutro Abs: 3.6 10*3/uL (ref 1.4–7.7)
Neutrophils Relative %: 44.2 % (ref 43.0–77.0)
Platelets: 180 10*3/uL (ref 150.0–400.0)
RBC: 4.37 Mil/uL (ref 4.22–5.81)
RDW: 13.8 % (ref 11.5–15.5)
WBC: 8.1 10*3/uL (ref 4.0–10.5)

## 2024-01-03 LAB — MICROALBUMIN / CREATININE URINE RATIO
Creatinine,U: 90.7 mg/dL
Microalb Creat Ratio: UNDETERMINED mg/g (ref 0.0–30.0)
Microalb, Ur: <0.7 mg/dL

## 2024-01-03 LAB — TSH: TSH: 1.77 u[IU]/mL (ref 0.35–5.50)

## 2024-01-03 LAB — LIPID PANEL
Cholesterol: 173 mg/dL (ref 0–200)
HDL: 55.4 mg/dL (ref >39.00)
LDL Cholesterol: 94 mg/dL (ref 0–99)
NonHDL: 117.82
Total CHOL/HDL Ratio: 3
Triglycerides: 121 mg/dL (ref 0.0–149.0)
VLDL: 24.2 mg/dL (ref 0.0–40.0)

## 2024-01-03 LAB — VITAMIN D 25 HYDROXY (VIT D DEFICIENCY, FRACTURES): VITD: 37.06 ng/mL (ref 30.00–100.00)

## 2024-01-03 LAB — HEMOGLOBIN A1C: Hgb A1c MFr Bld: 7.8 % — ABNORMAL HIGH (ref 4.6–6.5)

## 2024-01-03 LAB — PSA, MEDICARE: PSA: 1.47 ng/mL (ref 0.10–4.00)

## 2024-01-07 ENCOUNTER — Other Ambulatory Visit: Payer: Self-pay | Admitting: Family Medicine

## 2024-01-07 DIAGNOSIS — J302 Other seasonal allergic rhinitis: Secondary | ICD-10-CM

## 2024-01-10 ENCOUNTER — Encounter: Payer: Self-pay | Admitting: Family Medicine

## 2024-01-10 ENCOUNTER — Ambulatory Visit (INDEPENDENT_AMBULATORY_CARE_PROVIDER_SITE_OTHER): Payer: PPO | Admitting: Family Medicine

## 2024-01-10 VITALS — BP 138/70 | HR 81 | Temp 98.5°F | Ht 71.26 in | Wt 186.8 lb

## 2024-01-10 DIAGNOSIS — Z7984 Long term (current) use of oral hypoglycemic drugs: Secondary | ICD-10-CM

## 2024-01-10 DIAGNOSIS — I10 Essential (primary) hypertension: Secondary | ICD-10-CM

## 2024-01-10 DIAGNOSIS — M6530 Trigger finger, unspecified finger: Secondary | ICD-10-CM

## 2024-01-10 DIAGNOSIS — Z7189 Other specified counseling: Secondary | ICD-10-CM

## 2024-01-10 DIAGNOSIS — Z0000 Encounter for general adult medical examination without abnormal findings: Secondary | ICD-10-CM

## 2024-01-10 DIAGNOSIS — E538 Deficiency of other specified B group vitamins: Secondary | ICD-10-CM

## 2024-01-10 DIAGNOSIS — E782 Mixed hyperlipidemia: Secondary | ICD-10-CM | POA: Diagnosis not present

## 2024-01-10 DIAGNOSIS — E119 Type 2 diabetes mellitus without complications: Secondary | ICD-10-CM

## 2024-01-10 MED ORDER — CYANOCOBALAMIN 500 MCG PO TABS: 500.0000 ug | ORAL_TABLET | Freq: Every day | ORAL | Status: AC

## 2024-01-10 MED ORDER — DICLOFENAC SODIUM 1 % EX GEL: 2.0000 g | Freq: Four times a day (QID) | CUTANEOUS | Status: DC | PRN

## 2024-01-10 MED ORDER — METFORMIN HCL 500 MG PO TABS: ORAL_TABLET | ORAL | 3 refills | Status: AC

## 2024-01-10 NOTE — Progress Notes (Signed)
 Diabetes:  Using medications without difficulties: yes Hypoglycemic episodes: no Hyperglycemic episodes: no Feet problems: some rare tingling.   Blood Sugars averaging: 120-250 during the day, fasting vs nonfasting.   eye exam within last year: yes A1c higher.  His sugar was elevated starting in December.  Started taking metformin  BID at that point, then returned to 1 tab a day in the last week.  MALB prev d/w pt.  No change in plan with current results.    He has L 3rd finger triggering.  He is considering options.  Diclofenac  gel didn't help.   D/w pt about restart B12 at 500mcg per day.    Hypertension:    Using medication without problems or lightheadedness:  yes Chest pain with exertion:no Edema:no Short of breath:no  Elevated Cholesterol: Using medications without problems: yes Muscle aches: no Diet compliance: d/w pt . Exercise: d/w pt.   Flu previously done Shingles previously done PNA previously done Tetanus previously done COVID-vaccine previously done Colonoscopy 2024 Prostate cancer screening 2025 Advance directive-wife designated if patient were incapacitated. No abdominal aortic aneurysm identified on u/s 2024.    PMH and SH reviewed.   Vital signs, Meds and allergies reviewed.  ROS: Per HPI unless specifically indicated in ROS section   GEN: nad, alert and oriented HEENT: ncat NECK: supple w/o LA CV: rrr PULM: ctab, no inc wob ABD: soft, +bs EXT: no edema SKIN: well perfused.  L 3rd finger triggering.    Diabetic foot exam: Normal inspection No skin breakdown No calluses  Normal DP pulses Normal sensation to light tough and monofilament Nails normal except for chronic changes L 1st nail

## 2024-01-10 NOTE — Patient Instructions (Addendum)
 Tetanus may be cheaper at the pharmacy.   I would restart B12 500mcg per day.  I would try taking 2 metformin  the AM.   Then I would try adding on 1 pill at night, assuming you didn't have any lows.  Take care.  Glad to see you. Recheck in about 4 months, A1c at the visit.

## 2024-01-11 NOTE — Telephone Encounter (Signed)
 Copied from CRM [id number]. Topic: General - Other >> Jan 11, 2024  4:49 PM Orien Bird wrote: Reason for CRM: Pharmacy is needing a updated med list sent to [facility] fax number is listed [phone]

## 2024-01-13 DIAGNOSIS — E538 Deficiency of other specified B group vitamins: Secondary | ICD-10-CM | POA: Insufficient documentation

## 2024-01-13 NOTE — Assessment & Plan Note (Signed)
Continue work on diet and exercise.  Continue pravastatin.  Labs discussed with patient.

## 2024-01-13 NOTE — Assessment & Plan Note (Signed)
 A1c higher.  His sugar was elevated starting in December.  Started taking metformin  BID at that point, then returned to 1 tab a day in the last week.  MALB prev d/w pt.  No change in plan with current results.   I would try taking 2 metformin  the AM.   Then I would try adding on 1 pill at night, assuming he didn't have any low sugars.  Recheck in about 4 months, A1c at the visit.

## 2024-01-13 NOTE — Assessment & Plan Note (Signed)
 Advance directive- wife designated if patient were incapacitated.

## 2024-01-13 NOTE — Assessment & Plan Note (Signed)
 L 3rd finger triggering.  He is considering options.  Diclofenac  gel didn't help.

## 2024-01-13 NOTE — Assessment & Plan Note (Signed)
 D/w pt about restart B12 at 500mcg per day.

## 2024-01-13 NOTE — Assessment & Plan Note (Signed)
 Flu previously done Shingles previously done PNA previously done Tetanus previously done COVID-vaccine previously done Colonoscopy 2024 Prostate cancer screening 2025 Advance directive-wife designated if patient were incapacitated. No abdominal aortic aneurysm identified on u/s 2024.

## 2024-01-13 NOTE — Assessment & Plan Note (Signed)
 Continue work on diet and exercise.  Continue lisinopril  hydrochlorothiazide .  Labs discussed with patient.

## 2024-01-15 NOTE — Telephone Encounter (Signed)
 Fax sent.

## 2024-02-05 ENCOUNTER — Telehealth: Payer: Self-pay

## 2024-02-05 ENCOUNTER — Telehealth: Payer: Self-pay | Admitting: Gastroenterology

## 2024-02-05 NOTE — Telephone Encounter (Signed)
 The patient wife Adell Hones) called in to schedule her husband for Hemorrhoid banding.

## 2024-02-05 NOTE — Telephone Encounter (Signed)
 Patient called office -he stated he is wanting to have the hemorrhoid banding done with Dr.Vanga- I let him know that Dr. Baldomero Bone will be going to [facility] gastroenterology and he plans to follow her since she has been his provider for sometime.

## 2024-05-13 ENCOUNTER — Encounter: Payer: Self-pay | Admitting: Family Medicine

## 2024-05-13 ENCOUNTER — Ambulatory Visit: Admitting: Family Medicine

## 2024-05-13 VITALS — BP 136/62 | HR 68 | Temp 98.1°F | Ht 71.26 in | Wt 182.2 lb

## 2024-05-13 DIAGNOSIS — E119 Type 2 diabetes mellitus without complications: Secondary | ICD-10-CM | POA: Diagnosis not present

## 2024-05-13 LAB — POCT GLYCOSYLATED HEMOGLOBIN (HGB A1C): Hemoglobin A1C: 6.6 % — AB (ref 4.0–5.6)

## 2024-05-13 NOTE — Progress Notes (Unsigned)
 Diabetes:  Using medications without difficulties: yes Hypoglycemic episodes: no Hyperglycemic episodes: no Feet problems: his feet can get cold but normal sensation.   Blood Sugars averaging: 126 this AM.  That is typical on average, with some variation with fasting meals.   eye exam within last year: due this fall.   A1c 7.8----->6.6.  He is still working in the yard, active.    Meds, vitals, and allergies reviewed.   ROS: Per HPI unless specifically indicated in ROS section   GEN: nad, alert and oriented HEENT: ncat NECK: supple w/o LA CV: rrr. PULM: ctab, no inc wob ABD: soft, +bs EXT: no edema SKIN: no acute rash

## 2024-05-13 NOTE — Patient Instructions (Addendum)
 Keep going as is.   Take care.  Glad to see you. Recheck around March 2026, labs at the visit. Yearly visit.  Update me as needed.  I would get a flu shot each fall.

## 2024-05-14 NOTE — Assessment & Plan Note (Signed)
 A1c 7.8----->6.6.  He is still working in the yard, active.   Continue work on diet and exercise.  No change in meds.  Continue metformin . Recheck around March 2026, labs at the visit. Yearly visit.

## 2024-05-26 ENCOUNTER — Other Ambulatory Visit: Payer: Self-pay | Admitting: Family Medicine

## 2024-05-26 DIAGNOSIS — J302 Other seasonal allergic rhinitis: Secondary | ICD-10-CM

## 2024-05-26 DIAGNOSIS — E785 Hyperlipidemia, unspecified: Secondary | ICD-10-CM

## 2024-06-03 ENCOUNTER — Encounter: Payer: Self-pay | Admitting: Family Medicine

## 2024-06-04 NOTE — Telephone Encounter (Signed)
Please send the order.  Thanks.  

## 2024-06-06 ENCOUNTER — Other Ambulatory Visit: Payer: Self-pay

## 2024-06-06 DIAGNOSIS — E119 Type 2 diabetes mellitus without complications: Secondary | ICD-10-CM

## 2024-06-06 MED ORDER — FREESTYLE LIBRE 2 PLUS SENSOR MISC
11 refills | Status: AC
Start: 2024-06-06 — End: ?

## 2024-07-14 ENCOUNTER — Other Ambulatory Visit: Payer: Self-pay | Admitting: Family Medicine

## 2024-08-11 ENCOUNTER — Other Ambulatory Visit: Payer: Self-pay | Admitting: Family Medicine

## 2024-08-11 DIAGNOSIS — E782 Mixed hyperlipidemia: Secondary | ICD-10-CM

## 2024-08-27 ENCOUNTER — Other Ambulatory Visit: Payer: Self-pay | Admitting: Family Medicine

## 2024-09-01 ENCOUNTER — Encounter: Payer: Self-pay | Admitting: Pharmacist

## 2024-09-01 NOTE — Progress Notes (Signed)
 Pharmacy Quality Measure Review  This patient is appearing on a report for being at risk of failing the adherence measure for cholesterol (statin) medications this calendar year.   Medication: pravastatin  20 mg Last fill date: 05/12/24 for 90 day supply  Insurance report was not up to date. No action needed at this time.  Medication has been refilled as of 11/24 x90ds

## 2024-09-10 ENCOUNTER — Encounter: Payer: Self-pay | Admitting: Pharmacist

## 2024-09-10 NOTE — Progress Notes (Signed)
 Pharmacy Quality Measure Review  This patient is appearing on a report for being at risk of failing the adherence measure for hypertension (ACEi/ARB) medications this calendar year.   Medication: lisinopril -hctz Last fill date: 05/05/24 for 90 day supply  Insurance report was not up to date. No action needed at this time.  Medication has been refilled as of 11/24 x90 ds

## 2024-09-30 ENCOUNTER — Other Ambulatory Visit: Payer: Self-pay | Admitting: Family Medicine

## 2024-12-11 ENCOUNTER — Ambulatory Visit: Admitting: Family Medicine

## 2024-12-26 ENCOUNTER — Ambulatory Visit
